# Patient Record
Sex: Female | Born: 1960
Health system: Southern US, Community
[De-identification: ages and names within clinical notes are randomized; demographics above are authoritative.]

## PROBLEM LIST (undated history)

## (undated) DIAGNOSIS — T7840XA Allergy, unspecified, initial encounter: Secondary | ICD-10-CM

## (undated) DIAGNOSIS — D649 Anemia, unspecified: Secondary | ICD-10-CM

## (undated) DIAGNOSIS — J45909 Unspecified asthma, uncomplicated: Secondary | ICD-10-CM

## (undated) DIAGNOSIS — M8580 Other specified disorders of bone density and structure, unspecified site: Secondary | ICD-10-CM

## (undated) DIAGNOSIS — R609 Edema, unspecified: Secondary | ICD-10-CM

## (undated) DIAGNOSIS — E538 Deficiency of other specified B group vitamins: Secondary | ICD-10-CM

## (undated) DIAGNOSIS — M2550 Pain in unspecified joint: Secondary | ICD-10-CM

## (undated) DIAGNOSIS — M1990 Unspecified osteoarthritis, unspecified site: Secondary | ICD-10-CM

## (undated) DIAGNOSIS — E559 Vitamin D deficiency, unspecified: Secondary | ICD-10-CM

## (undated) DIAGNOSIS — K219 Gastro-esophageal reflux disease without esophagitis: Secondary | ICD-10-CM

## (undated) DIAGNOSIS — G709 Myoneural disorder, unspecified: Secondary | ICD-10-CM

## (undated) DIAGNOSIS — G894 Chronic pain syndrome: Secondary | ICD-10-CM

## (undated) HISTORY — DX: Anemia, unspecified: D64.9

## (undated) HISTORY — DX: Unspecified osteoarthritis, unspecified site: M19.90

## (undated) HISTORY — DX: Vitamin D deficiency, unspecified: E55.9

## (undated) HISTORY — PX: ORTHOPEDIC SURGERY: SHX850

## (undated) HISTORY — DX: Deficiency of other specified B group vitamins: E53.8

## (undated) HISTORY — DX: Edema, unspecified: R60.9

## (undated) HISTORY — PX: SPINE SURGERY: SHX786

## (undated) HISTORY — PX: BACK SURGERY: SHX140

## (undated) HISTORY — PX: HAND SURGERY: SHX662

## (undated) HISTORY — DX: Pain in unspecified joint: M25.50

## (undated) HISTORY — PX: CHOLECYSTECTOMY: SHX55

## (undated) HISTORY — DX: Other specified disorders of bone density and structure, unspecified site: M85.80

## (undated) HISTORY — PX: HERNIA REPAIR: SHX51

## (undated) HISTORY — PX: OTHER SURGICAL HISTORY: SHX169

## (undated) HISTORY — PX: APPENDECTOMY: SHX54

## (undated) HISTORY — DX: Allergy, unspecified, initial encounter: T78.40XA

## (undated) HISTORY — PX: EYE SURGERY: SHX253

## (undated) HISTORY — DX: Gastro-esophageal reflux disease without esophagitis: K21.9

## (undated) HISTORY — PX: FRACTURE SURGERY: SHX138

## (undated) HISTORY — DX: Myoneural disorder, unspecified: G70.9

## (undated) HISTORY — DX: Unspecified asthma, uncomplicated: J45.909

## (undated) HISTORY — DX: Chronic pain syndrome: G89.4

---

## 1999-11-07 ENCOUNTER — Encounter (INDEPENDENT_AMBULATORY_CARE_PROVIDER_SITE_OTHER): Payer: Self-pay | Admitting: Internal Medicine

## 2000-08-17 HISTORY — PX: ROTATOR CUFF REPAIR: SHX139

## 2003-08-20 ENCOUNTER — Other Ambulatory Visit: Admission: RE | Admit: 2003-08-20 | Discharge: 2003-08-20 | Payer: Self-pay | Admitting: Family Medicine

## 2004-04-08 ENCOUNTER — Ambulatory Visit (HOSPITAL_COMMUNITY): Admission: RE | Admit: 2004-04-08 | Discharge: 2004-04-08 | Payer: Self-pay | Admitting: Family Medicine

## 2004-04-26 ENCOUNTER — Encounter (INDEPENDENT_AMBULATORY_CARE_PROVIDER_SITE_OTHER): Payer: Self-pay | Admitting: *Deleted

## 2004-04-26 ENCOUNTER — Ambulatory Visit (HOSPITAL_COMMUNITY): Admission: RE | Admit: 2004-04-26 | Discharge: 2004-04-26 | Payer: Self-pay | Admitting: Internal Medicine

## 2004-04-26 ENCOUNTER — Ambulatory Visit: Payer: Self-pay | Admitting: Internal Medicine

## 2004-05-31 ENCOUNTER — Ambulatory Visit: Payer: Self-pay | Admitting: Internal Medicine

## 2004-07-20 ENCOUNTER — Ambulatory Visit: Payer: Self-pay | Admitting: Family Medicine

## 2005-02-08 ENCOUNTER — Ambulatory Visit: Payer: Self-pay | Admitting: Family Medicine

## 2005-12-29 ENCOUNTER — Ambulatory Visit: Payer: Self-pay | Admitting: Family Medicine

## 2006-01-15 ENCOUNTER — Other Ambulatory Visit: Admission: RE | Admit: 2006-01-15 | Discharge: 2006-01-15 | Payer: Self-pay | Admitting: Family Medicine

## 2006-01-15 ENCOUNTER — Ambulatory Visit: Payer: Self-pay | Admitting: Family Medicine

## 2006-01-15 ENCOUNTER — Encounter (INDEPENDENT_AMBULATORY_CARE_PROVIDER_SITE_OTHER): Payer: Self-pay | Admitting: Internal Medicine

## 2006-01-15 LAB — CONVERTED CEMR LAB: Pap Smear: NORMAL

## 2006-01-17 HISTORY — PX: CARPAL TUNNEL RELEASE: SHX101

## 2006-05-16 ENCOUNTER — Encounter: Admission: RE | Admit: 2006-05-16 | Discharge: 2006-05-16 | Payer: Self-pay | Admitting: Family Medicine

## 2006-05-25 ENCOUNTER — Encounter: Admission: RE | Admit: 2006-05-25 | Discharge: 2006-05-25 | Payer: Self-pay | Admitting: Family Medicine

## 2006-09-10 ENCOUNTER — Ambulatory Visit: Payer: Self-pay | Admitting: Family Medicine

## 2007-04-01 ENCOUNTER — Encounter (INDEPENDENT_AMBULATORY_CARE_PROVIDER_SITE_OTHER): Payer: Self-pay | Admitting: Internal Medicine

## 2007-04-09 ENCOUNTER — Encounter (INDEPENDENT_AMBULATORY_CARE_PROVIDER_SITE_OTHER): Payer: Self-pay | Admitting: Internal Medicine

## 2007-04-09 DIAGNOSIS — Q690 Accessory finger(s): Secondary | ICD-10-CM | POA: Insufficient documentation

## 2007-04-09 DIAGNOSIS — G5600 Carpal tunnel syndrome, unspecified upper limb: Secondary | ICD-10-CM | POA: Insufficient documentation

## 2007-04-09 DIAGNOSIS — A630 Anogenital (venereal) warts: Secondary | ICD-10-CM | POA: Insufficient documentation

## 2007-04-09 DIAGNOSIS — K222 Esophageal obstruction: Secondary | ICD-10-CM

## 2007-04-09 DIAGNOSIS — G8929 Other chronic pain: Secondary | ICD-10-CM

## 2007-04-15 ENCOUNTER — Ambulatory Visit: Payer: Self-pay | Admitting: Family Medicine

## 2007-04-15 DIAGNOSIS — M479 Spondylosis, unspecified: Secondary | ICD-10-CM | POA: Insufficient documentation

## 2007-04-15 DIAGNOSIS — R609 Edema, unspecified: Secondary | ICD-10-CM | POA: Insufficient documentation

## 2007-04-15 DIAGNOSIS — G9050 Complex regional pain syndrome I, unspecified: Secondary | ICD-10-CM | POA: Insufficient documentation

## 2007-04-15 DIAGNOSIS — E669 Obesity, unspecified: Secondary | ICD-10-CM

## 2007-05-07 ENCOUNTER — Ambulatory Visit: Payer: Self-pay | Admitting: Family Medicine

## 2007-05-07 ENCOUNTER — Encounter (INDEPENDENT_AMBULATORY_CARE_PROVIDER_SITE_OTHER): Payer: Self-pay | Admitting: Internal Medicine

## 2007-05-10 LAB — CONVERTED CEMR LAB
ALT: 10 units/L (ref 0–35)
AST: 9 units/L (ref 0–37)
Albumin: 4.3 g/dL (ref 3.5–5.2)
Alkaline Phosphatase: 54 units/L (ref 39–117)
BUN: 11 mg/dL (ref 6–23)
Basophils Absolute: 0 10*3/uL (ref 0.0–0.1)
Basophils Relative: 1 % (ref 0–1)
CO2: 22 meq/L (ref 19–32)
Calcium: 9.5 mg/dL (ref 8.4–10.5)
Chloride: 103 meq/L (ref 96–112)
Cholesterol: 175 mg/dL (ref 0–200)
Creatinine, Ser: 0.66 mg/dL (ref 0.40–1.20)
Eosinophils Absolute: 0.2 10*3/uL (ref 0.2–0.7)
Eosinophils Relative: 4 % (ref 0–5)
Glucose, Bld: 78 mg/dL (ref 70–99)
HCT: 36.2 % (ref 36.0–46.0)
HDL: 58 mg/dL (ref >39)
Hemoglobin: 11.4 g/dL — ABNORMAL LOW (ref 12.0–15.0)
LDL Cholesterol: 102 mg/dL — ABNORMAL HIGH (ref 0–99)
Lymphocytes Relative: 22 % (ref 12–46)
Lymphs Abs: 1 10*3/uL (ref 0.7–4.0)
MCHC: 31.5 g/dL (ref 30.0–36.0)
MCV: 89.4 fL (ref 78.0–100.0)
Monocytes Absolute: 0.4 10*3/uL (ref 0.1–1.0)
Monocytes Relative: 8 % (ref 3–12)
Neutro Abs: 2.8 10*3/uL (ref 1.7–7.7)
Neutrophils Relative %: 65 % (ref 43–77)
Platelets: 348 10*3/uL (ref 150–400)
Potassium: 4.1 meq/L (ref 3.5–5.3)
RBC: 4.05 M/uL (ref 3.87–5.11)
RDW: 14.7 % (ref 11.5–15.5)
Sodium: 140 meq/L (ref 135–145)
TSH: 1.946 microintl units/mL (ref 0.350–5.50)
Total Bilirubin: 0.5 mg/dL (ref 0.3–1.2)
Total CHOL/HDL Ratio: 3
Total Protein: 7.2 g/dL (ref 6.0–8.3)
Triglycerides: 73 mg/dL (ref <150)
VLDL: 15 mg/dL (ref 0–40)
Vit D, 1,25-Dihydroxy: 22 — ABNORMAL LOW (ref 30–89)
WBC: 4.4 10*3/uL (ref 4.0–10.5)

## 2007-05-27 ENCOUNTER — Encounter (INDEPENDENT_AMBULATORY_CARE_PROVIDER_SITE_OTHER): Payer: Self-pay | Admitting: Internal Medicine

## 2007-06-06 ENCOUNTER — Encounter (INDEPENDENT_AMBULATORY_CARE_PROVIDER_SITE_OTHER): Payer: Self-pay | Admitting: Internal Medicine

## 2007-10-23 ENCOUNTER — Encounter (INDEPENDENT_AMBULATORY_CARE_PROVIDER_SITE_OTHER): Payer: Self-pay | Admitting: Internal Medicine

## 2007-12-25 ENCOUNTER — Encounter (INDEPENDENT_AMBULATORY_CARE_PROVIDER_SITE_OTHER): Payer: Self-pay | Admitting: Internal Medicine

## 2007-12-25 ENCOUNTER — Ambulatory Visit: Payer: Self-pay | Admitting: Family Medicine

## 2007-12-25 DIAGNOSIS — D649 Anemia, unspecified: Secondary | ICD-10-CM

## 2007-12-25 DIAGNOSIS — E559 Vitamin D deficiency, unspecified: Secondary | ICD-10-CM

## 2007-12-27 LAB — CONVERTED CEMR LAB
HCT: 40.2 % (ref 36.0–46.0)
Hemoglobin: 12.8 g/dL (ref 12.0–15.0)
MCHC: 31.8 g/dL (ref 30.0–36.0)
MCV: 91.8 fL (ref 78.0–100.0)
Platelets: 369 10*3/uL (ref 150–400)
RBC: 4.38 M/uL (ref 3.87–5.11)
RDW: 14.6 % (ref 11.5–15.5)
Vit D, 1,25-Dihydroxy: 14 — ABNORMAL LOW (ref 30–89)
WBC: 6.2 10*3/uL (ref 4.0–10.5)

## 2008-01-13 ENCOUNTER — Telehealth (INDEPENDENT_AMBULATORY_CARE_PROVIDER_SITE_OTHER): Payer: Self-pay | Admitting: Internal Medicine

## 2008-01-17 ENCOUNTER — Telehealth (INDEPENDENT_AMBULATORY_CARE_PROVIDER_SITE_OTHER): Payer: Self-pay | Admitting: Internal Medicine

## 2008-01-22 ENCOUNTER — Encounter (INDEPENDENT_AMBULATORY_CARE_PROVIDER_SITE_OTHER): Payer: Self-pay | Admitting: Internal Medicine

## 2008-01-28 ENCOUNTER — Encounter (INDEPENDENT_AMBULATORY_CARE_PROVIDER_SITE_OTHER): Payer: Self-pay | Admitting: Internal Medicine

## 2008-02-13 ENCOUNTER — Telehealth: Payer: Self-pay | Admitting: Family Medicine

## 2008-02-27 ENCOUNTER — Telehealth (INDEPENDENT_AMBULATORY_CARE_PROVIDER_SITE_OTHER): Payer: Self-pay | Admitting: Internal Medicine

## 2008-02-27 ENCOUNTER — Encounter (INDEPENDENT_AMBULATORY_CARE_PROVIDER_SITE_OTHER): Payer: Self-pay | Admitting: Internal Medicine

## 2008-03-19 ENCOUNTER — Ambulatory Visit: Payer: Self-pay | Admitting: Family Medicine

## 2008-03-19 ENCOUNTER — Encounter (INDEPENDENT_AMBULATORY_CARE_PROVIDER_SITE_OTHER): Payer: Self-pay | Admitting: Internal Medicine

## 2008-03-24 ENCOUNTER — Telehealth (INDEPENDENT_AMBULATORY_CARE_PROVIDER_SITE_OTHER): Payer: Self-pay | Admitting: Internal Medicine

## 2008-03-26 ENCOUNTER — Telehealth (INDEPENDENT_AMBULATORY_CARE_PROVIDER_SITE_OTHER): Payer: Self-pay | Admitting: Internal Medicine

## 2008-03-26 LAB — CONVERTED CEMR LAB: Vit D, 1,25-Dihydroxy: 93 — ABNORMAL HIGH (ref 30–89)

## 2008-04-22 ENCOUNTER — Encounter (INDEPENDENT_AMBULATORY_CARE_PROVIDER_SITE_OTHER): Payer: Self-pay | Admitting: Internal Medicine

## 2008-05-06 ENCOUNTER — Encounter (INDEPENDENT_AMBULATORY_CARE_PROVIDER_SITE_OTHER): Payer: Self-pay | Admitting: Internal Medicine

## 2008-05-06 ENCOUNTER — Other Ambulatory Visit: Admission: RE | Admit: 2008-05-06 | Discharge: 2008-05-06 | Payer: Self-pay | Admitting: Family Medicine

## 2008-05-06 ENCOUNTER — Ambulatory Visit: Payer: Self-pay | Admitting: Family Medicine

## 2008-05-06 LAB — CONVERTED CEMR LAB: Pap Smear: NORMAL

## 2008-05-12 ENCOUNTER — Encounter: Admission: RE | Admit: 2008-05-12 | Discharge: 2008-05-12 | Payer: Self-pay | Admitting: Family Medicine

## 2008-05-18 ENCOUNTER — Encounter (INDEPENDENT_AMBULATORY_CARE_PROVIDER_SITE_OTHER): Payer: Self-pay | Admitting: *Deleted

## 2008-05-19 ENCOUNTER — Encounter (INDEPENDENT_AMBULATORY_CARE_PROVIDER_SITE_OTHER): Payer: Self-pay | Admitting: Internal Medicine

## 2008-05-19 DIAGNOSIS — R928 Other abnormal and inconclusive findings on diagnostic imaging of breast: Secondary | ICD-10-CM

## 2008-05-22 ENCOUNTER — Encounter: Admission: RE | Admit: 2008-05-22 | Discharge: 2008-05-22 | Payer: Self-pay | Admitting: Family Medicine

## 2008-05-26 ENCOUNTER — Encounter (INDEPENDENT_AMBULATORY_CARE_PROVIDER_SITE_OTHER): Payer: Self-pay | Admitting: *Deleted

## 2008-05-26 ENCOUNTER — Telehealth (INDEPENDENT_AMBULATORY_CARE_PROVIDER_SITE_OTHER): Payer: Self-pay | Admitting: Internal Medicine

## 2008-06-16 ENCOUNTER — Ambulatory Visit: Payer: Self-pay | Admitting: Family Medicine

## 2008-07-06 ENCOUNTER — Telehealth (INDEPENDENT_AMBULATORY_CARE_PROVIDER_SITE_OTHER): Payer: Self-pay | Admitting: Internal Medicine

## 2008-09-02 ENCOUNTER — Encounter (INDEPENDENT_AMBULATORY_CARE_PROVIDER_SITE_OTHER): Payer: Self-pay | Admitting: Internal Medicine

## 2008-09-22 ENCOUNTER — Ambulatory Visit: Payer: Self-pay | Admitting: Internal Medicine

## 2008-09-24 ENCOUNTER — Encounter (INDEPENDENT_AMBULATORY_CARE_PROVIDER_SITE_OTHER): Payer: Self-pay | Admitting: Internal Medicine

## 2008-09-25 LAB — CONVERTED CEMR LAB: Vit D, 25-Hydroxy: 19 ng/mL — ABNORMAL LOW (ref 30–89)

## 2008-10-08 ENCOUNTER — Ambulatory Visit: Payer: Self-pay | Admitting: Family Medicine

## 2008-10-14 ENCOUNTER — Encounter (INDEPENDENT_AMBULATORY_CARE_PROVIDER_SITE_OTHER): Payer: Self-pay | Admitting: Internal Medicine

## 2008-11-02 ENCOUNTER — Telehealth (INDEPENDENT_AMBULATORY_CARE_PROVIDER_SITE_OTHER): Payer: Self-pay | Admitting: Internal Medicine

## 2008-11-18 ENCOUNTER — Encounter (INDEPENDENT_AMBULATORY_CARE_PROVIDER_SITE_OTHER): Payer: Self-pay | Admitting: Internal Medicine

## 2008-12-23 ENCOUNTER — Encounter (INDEPENDENT_AMBULATORY_CARE_PROVIDER_SITE_OTHER): Payer: Self-pay | Admitting: Internal Medicine

## 2009-01-06 ENCOUNTER — Telehealth (INDEPENDENT_AMBULATORY_CARE_PROVIDER_SITE_OTHER): Payer: Self-pay | Admitting: Internal Medicine

## 2009-02-05 ENCOUNTER — Telehealth (INDEPENDENT_AMBULATORY_CARE_PROVIDER_SITE_OTHER): Payer: Self-pay | Admitting: Internal Medicine

## 2009-02-24 ENCOUNTER — Encounter (INDEPENDENT_AMBULATORY_CARE_PROVIDER_SITE_OTHER): Payer: Self-pay | Admitting: Internal Medicine

## 2009-03-11 ENCOUNTER — Encounter (INDEPENDENT_AMBULATORY_CARE_PROVIDER_SITE_OTHER): Payer: Self-pay | Admitting: Internal Medicine

## 2009-05-20 ENCOUNTER — Encounter: Admission: RE | Admit: 2009-05-20 | Discharge: 2009-05-20 | Payer: Self-pay | Admitting: Family Medicine

## 2009-05-24 ENCOUNTER — Encounter (INDEPENDENT_AMBULATORY_CARE_PROVIDER_SITE_OTHER): Payer: Self-pay | Admitting: *Deleted

## 2009-06-01 ENCOUNTER — Telehealth (INDEPENDENT_AMBULATORY_CARE_PROVIDER_SITE_OTHER): Payer: Self-pay | Admitting: Internal Medicine

## 2009-07-08 ENCOUNTER — Encounter (INDEPENDENT_AMBULATORY_CARE_PROVIDER_SITE_OTHER): Payer: Self-pay | Admitting: Internal Medicine

## 2009-07-30 ENCOUNTER — Encounter: Payer: Self-pay | Admitting: Family Medicine

## 2009-08-18 ENCOUNTER — Encounter: Payer: Self-pay | Admitting: Family Medicine

## 2009-09-14 ENCOUNTER — Ambulatory Visit: Payer: Self-pay | Admitting: Family Medicine

## 2009-09-15 LAB — CONVERTED CEMR LAB: Vit D, 25-Hydroxy: 19 ng/mL — ABNORMAL LOW (ref 30–89)

## 2009-09-16 LAB — CONVERTED CEMR LAB
ALT: 12 units/L (ref 0–35)
AST: 11 units/L (ref 0–37)
Albumin: 3.8 g/dL (ref 3.5–5.2)
Alkaline Phosphatase: 47 units/L (ref 39–117)
BUN: 8 mg/dL (ref 6–23)
Bilirubin, Direct: 0.1 mg/dL (ref 0.0–0.3)
CO2: 30 meq/L (ref 19–32)
Calcium: 9.1 mg/dL (ref 8.4–10.5)
Chloride: 105 meq/L (ref 96–112)
Cholesterol: 154 mg/dL (ref 0–200)
Creatinine, Ser: 0.6 mg/dL (ref 0.4–1.2)
GFR calc non Af Amer: 112.45 mL/min (ref >60)
Glucose, Bld: 83 mg/dL (ref 70–99)
HDL: 51.5 mg/dL (ref >39.00)
LDL Cholesterol: 90 mg/dL (ref 0–99)
Potassium: 4.1 meq/L (ref 3.5–5.1)
Sodium: 142 meq/L (ref 135–145)
Total Bilirubin: 0.5 mg/dL (ref 0.3–1.2)
Total CHOL/HDL Ratio: 3
Total Protein: 6.8 g/dL (ref 6.0–8.3)
Triglycerides: 65 mg/dL (ref 0.0–149.0)
VLDL: 13 mg/dL (ref 0.0–40.0)

## 2009-09-20 ENCOUNTER — Encounter: Payer: Self-pay | Admitting: Family Medicine

## 2009-10-25 ENCOUNTER — Encounter: Payer: Self-pay | Admitting: Family Medicine

## 2010-01-19 ENCOUNTER — Encounter: Payer: Self-pay | Admitting: Family Medicine

## 2010-02-07 ENCOUNTER — Ambulatory Visit: Payer: Self-pay | Admitting: Internal Medicine

## 2010-02-07 DIAGNOSIS — J029 Acute pharyngitis, unspecified: Secondary | ICD-10-CM

## 2010-02-07 LAB — CONVERTED CEMR LAB: Rapid Strep: NEGATIVE

## 2010-03-01 ENCOUNTER — Telehealth: Payer: Self-pay | Admitting: Family Medicine

## 2010-03-10 ENCOUNTER — Encounter: Payer: Self-pay | Admitting: Family Medicine

## 2010-03-21 ENCOUNTER — Ambulatory Visit: Payer: Self-pay | Admitting: Family Medicine

## 2010-03-21 DIAGNOSIS — N289 Disorder of kidney and ureter, unspecified: Secondary | ICD-10-CM | POA: Insufficient documentation

## 2010-03-21 LAB — CONVERTED CEMR LAB
Bilirubin Urine: NEGATIVE
Glucose, Urine, Semiquant: NEGATIVE
Ketones, urine, test strip: NEGATIVE
Nitrite: NEGATIVE
Protein, U semiquant: NEGATIVE
Specific Gravity, Urine: 1.01
Urobilinogen, UA: 0.2
WBC Urine, dipstick: NEGATIVE
pH: 5

## 2010-03-22 ENCOUNTER — Encounter: Admission: RE | Admit: 2010-03-22 | Discharge: 2010-03-22 | Payer: Self-pay | Admitting: Family Medicine

## 2010-03-22 ENCOUNTER — Encounter: Payer: Self-pay | Admitting: Family Medicine

## 2010-03-24 ENCOUNTER — Ambulatory Visit: Payer: Self-pay | Admitting: Family Medicine

## 2010-03-24 LAB — CONVERTED CEMR LAB
Bilirubin Urine: NEGATIVE
Blood in Urine, dipstick: NEGATIVE
Glucose, Urine, Semiquant: NEGATIVE
Ketones, urine, test strip: NEGATIVE
Nitrite: NEGATIVE
Protein, U semiquant: NEGATIVE
Specific Gravity, Urine: 1.015
Urobilinogen, UA: 0.2
WBC Urine, dipstick: NEGATIVE
pH: 5

## 2010-04-11 ENCOUNTER — Telehealth: Payer: Self-pay | Admitting: Family Medicine

## 2010-04-29 ENCOUNTER — Telehealth: Payer: Self-pay | Admitting: Family Medicine

## 2010-06-14 ENCOUNTER — Encounter
Admission: RE | Admit: 2010-06-14 | Discharge: 2010-06-14 | Payer: Self-pay | Source: Home / Self Care | Attending: Family Medicine | Admitting: Family Medicine

## 2010-06-30 ENCOUNTER — Telehealth: Payer: Self-pay | Admitting: Family Medicine

## 2010-07-10 ENCOUNTER — Encounter: Payer: Self-pay | Admitting: Family Medicine

## 2010-07-11 ENCOUNTER — Encounter: Payer: Self-pay | Admitting: Family Medicine

## 2010-07-13 ENCOUNTER — Telehealth: Payer: Self-pay | Admitting: Family Medicine

## 2010-07-19 NOTE — Letter (Signed)
Summary: [facility]  [facility]   Imported By: Lanelle Bal 08/24/2009 09:42:08  _____________________________________________________________________  External Attachment:    Type:   Image     Comment:   External Document  Appended Document: [facility] Pt needs to establish with new doctor.  Appended Document: [facility] Patient notified as instructed by telephone. Was informed by patient that she will call back later today and get something scheduled.

## 2010-07-19 NOTE — Assessment & Plan Note (Signed)
Summary: DISCUSS MRI RESULTS BY DR POEHLING/   Vital Signs:  Patient profile:   50 year old female Height:      65 inches Weight:      146 pounds BMI:     24.38 Temp:     97.5 degrees F oral Pulse rate:   60 / minute Pulse rhythm:   regular BP sitting:   120 / 72  (left arm) Cuff size:   regular  Vitals Entered By: Linde Gillis CMA Duncan Dull) (March 21, 2010 11:36 AM) CC: discuss MRI results   History of Present Illness: 50 yo here to follow up MRI that she had done for her hip pain.  Dr. Randall An, ortho, ordered MRI of left hip- incidently T2 hyperintense lesions on right kidney were found that measured up to 11-12 mm. Pt has been asymptomatic, no worsening back pain, no visible hematuria, fevers or abdominal pain.  Several months ago, felt her urine had a strong odor otherwise nothing out of the ordinary.    Current Medications (verified): 1)  Vicodin 5-500 Mg  Tabs (Hydrocodone-Acetaminophen) .... Take One By Mouth Four Times A Day 2)  Prilosec 40 Mg  Cpdr (Omeprazole) .... Take One By Mouth Once A Day As Needed 3)  Lidoderm 5 %  Ptch (Lidocaine) .Marland Kitchen.. 1-2  Patches On Effected Area, Leave On For No Longer Than 12 Hours 4)  Flexeril 5 Mg  Tabs (Cyclobenzaprine Hcl) .... Take 1/2 - 1 By Mouth Four Times A Day As Needed 5)  Multivitamins   Tabs (Multiple Vitamin) .... Take One By Mouth Once A Day 6)  Albuterol 90 Mcg/act  Aers (Albuterol) .... Use As Needed 7)  Hydrochlorothiazide 25 Mg  Tabs (Hydrochlorothiazide) .... 1/2 To 1 Once Daily For Swelling 8)  Tramadol Hcl 50 Mg  Tabs (Tramadol Hcl) .Marland Kitchen.. 1-2 Every 6 Hrs As Needed Pain 9)  Tizanidine Hcl 2 Mg Tabs (Tizanidine Hcl) .Marland Kitchen.. 1 Two Times A Day As Needed Muscle Spasms 10)  Lidoderm Oint .... Apply Thinnly To Tender Areas On L Shoulder 2-3 Times Daily, 30gm Tube 11)  Aleve 220 Mg Tabs (Naproxen Sodium) .... Two Two Times A Day 12)  Norco 5-325 Mg Tabs (Hydrocodone-Acetaminophen) .Marland Kitchen.. 1 Tab Every Six Hours As Needed For  Pain  Allergies: 1)  ! Clonidine Hcl (Clonidine Hcl) 2)  ! Septra Ds (Sulfamethoxazole-Trimethoprim) 3)  ! * Dilaudid 4)  * Codeine 5)  * Morphine 6)  * Percocet 7)  * Prednisone 8)  * Sulfa  Past History:  Past Medical History: Last updated: 09/14/2009 GERD Complex regional pain sydrome  Past Surgical History: Last updated: 02/07/2010 s/p 57 surgeries  Carpal tunnel Rotator cuff repair-- 03/02 Carpal tunnel-- 08/07 Pollicized left thumb-- 04/08 EMG left arm-- 08/07 L  hand surg --9/09 EMG/NCS Nml--3/11  Social History: Last updated: 04/09/2007 Marital Status: Divorced--remarried Children: 2 Occupation: Works in Psychologist, counselling Daughter  is hyperreflexic, son has polydactyl  Risk Factors: Alcohol Use: <1 (04/15/2007) Exercise: no (05/06/2008)  Risk Factors: Smoking Status: never (04/09/2007) Passive Smoke Exposure: yes (05/06/2008)  Review of Systems      See HPI General:  Denies malaise. Eyes:  Denies blurring. GI:  Denies abdominal pain and change in bowel habits. GU:  Denies dysuria, incontinence, and urinary frequency.  Physical Exam  General:  alert, well-developed, well-nourished, and well-hydrated.  NAS Abdomen:  soft, non-tender, normal bowel sounds, no distention, no masses, no guarding, no abdominal hernia, no inguinal hernia, no hepatomegaly, and no splenomegaly.  Psych:  normally interactive and good eye contact.     Impression & Recommendations:  Problem # 1:  UNSPECIFIED DISORDER OF KIDNEY AND URETER (ICD-593.9) Assessment New UA showed some microscopic hematuria, will send for culture. Will get renal ultrasound to evaluate lesions. Orders: Radiology Referral (Radiology) UA Dipstick w/o Micro (manual) (41324) T-Culture, Urine ([id number])  Complete Medication List: 1)  Vicodin 5-500 Mg Tabs (Hydrocodone-acetaminophen) .... Take one by mouth four times a day 2)  Prilosec 40 Mg Cpdr (Omeprazole) .... Take one by mouth once a day  as needed 3)  Lidoderm 5 % Ptch (Lidocaine) .Marland Kitchen.. 1-2  patches on effected area, leave on for no longer than 12 hours 4)  Flexeril 5 Mg Tabs (Cyclobenzaprine hcl) .... Take 1/2 - 1 by mouth four times a day as needed 5)  Multivitamins Tabs (Multiple vitamin) .... Take one by mouth once a day 6)  Albuterol 90 Mcg/act Aers (Albuterol) .... Use as needed 7)  Hydrochlorothiazide 25 Mg Tabs (Hydrochlorothiazide) .... 1/2 to 1 once daily for swelling 8)  Tramadol Hcl 50 Mg Tabs (Tramadol hcl) .Marland Kitchen.. 1-2 every 6 hrs as needed pain 9)  Tizanidine Hcl 2 Mg Tabs (Tizanidine hcl) .Marland Kitchen.. 1 two times a day as needed muscle spasms 10)  Lidoderm Oint  .... Apply thinnly to tender areas on l shoulder 2-3 times daily, 30gm tube 11)  Aleve 220 Mg Tabs (Naproxen sodium) .... Two two times a day 12)  Norco 5-325 Mg Tabs (Hydrocodone-acetaminophen) .Marland Kitchen.. 1 tab every six hours as needed for pain  Patient Instructions: 1)  Please stop by to see Shirlee Limerick on your way out. Prescriptions: NORCO 5-325 MG TABS (HYDROCODONE-ACETAMINOPHEN) 1 tab every six hours as needed for pain  #60 x 0   Entered and Authorized by:   Ruthe Mannan MD   Signed by:   Ruthe Mannan MD on 03/21/2010   Method used:   Print then Give to Patient   RxID:   [id number]   Current Allergies (reviewed today): ! CLONIDINE HCL (CLONIDINE HCL) ! SEPTRA DS (SULFAMETHOXAZOLE-TRIMETHOPRIM) ! * DILAUDID * CODEINE * MORPHINE * PERCOCET * PREDNISONE * SULFA  Laboratory Results   Urine Tests  Date/Time Received: March 21, 2010 11:53 AM   Routine Urinalysis   Color: yellow Appearance: Clear Glucose: negative   (Normal Range: Negative) Bilirubin: negative   (Normal Range: Negative) Ketone: negative   (Normal Range: Negative) Spec. Gravity: 1.010   (Normal Range: 1.003-1.035) Blood: trace-intact   (Normal Range: Negative) pH: 5.0   (Normal Range: 5.0-8.0) Protein: negative   (Normal Range: Negative) Urobilinogen: 0.2   (Normal Range:  0-1) Nitrite: negative   (Normal Range: Negative) Leukocyte Esterace: negative   (Normal Range: Negative)

## 2010-07-19 NOTE — Progress Notes (Signed)
Summary: vicodin  Phone Note Refill Request Message from:  Fax from Pharmacy on April 11, 2010 9:13 AM  Refills Requested: Medication #1:  VICODIN 5-500 MG  TABS Take one by mouth four times a day   Last Refilled: 03/24/2010 Refill request from [place] [facility]. [phone]  Initial call taken by: Melody Comas,  April 11, 2010 9:17 AM  Follow-up for Phone Call        Rx called to pharmacy Follow-up by: Linde Gillis CMA Duncan Dull),  April 11, 2010 9:34 AM    Prescriptions: VICODIN 5-500 MG  TABS (HYDROCODONE-ACETAMINOPHEN) Take one by mouth four times a day  #200 x 2   Entered and Authorized by:   Ruthe Mannan MD   Signed by:   Ruthe Mannan MD on 04/11/2010   Method used:   Telephoned to ...       [facility]* (retail)       [address]       [place], [place]  [address]       Ph: [phone]       Fax: [phone]   RxID:   [id number]

## 2010-07-19 NOTE — Assessment & Plan Note (Signed)
Summary: CONGESTION & SORE THROAT / LFW   Vital Signs:  Patient profile:   50 year old female Weight:      149.25 pounds Pulse rate:   70 / minute Pulse rhythm:   regular BP sitting:   110 / 72  (left arm) Cuff size:   regular  Vitals Entered By: Selena Batten Dance CMA Duncan Dull) (February 07, 2010 3:32 PM) CC: ST and congestion x3 days   History of Present Illness: CC: ST, congestion  3d h/o ST and congestion.  + PNDrip.  Did take care of new cats 2x/day over weekend but normally not allergic like this.  + subjective fever yesterday morning.  + HA, ear pain, + sinus tenderness, + purulent drainage x yesterday.  No coughing, abd pain, n/v/d.  No drooling, no trouble opening mouth.  Has taken pseudophed, chlorpheniramine, nyquil at night.  Possible sick contacts at home.  Takes vicodin for pain, unsure if cough suppressed.  Allergies: 1)  ! Clonidine Hcl (Clonidine Hcl) 2)  ! Septra Ds (Sulfamethoxazole-Trimethoprim) 3)  ! * Dilaudid 4)  * Codeine 5)  * Morphine 6)  * Percocet 7)  * Prednisone 8)  * Sulfa  Past History:  Past Surgical History: s/p 57 surgeries  Carpal tunnel Rotator cuff repair-- 03/02 Carpal tunnel-- 08/07 Pollicized left thumb-- 04/08 EMG left arm-- 08/07 L  hand surg --9/09 EMG/NCS Nml--3/11  Review of Systems       per HPI  Physical Exam  General:  alert, well-developed, well-nourished, and well-hydrated.  NAS Head:  Normocephalic and atraumatic without obvious abnormalities. No apparent alopecia or balding.  + maxillary tenderness Eyes:  L lazy eye.  R PERRLA Ears:  fluid behind L TM R TM clear Nose:  no external deformity.   Mouth:  MMM, pharynx red, no exudates appreciated. Neck:  R AC LAD Lungs:  no crackles, wheezing,  + coarse breath sounds throughout Heart:  normal rate, regular rhythm, and no murmur.   Msk:  bilateral UE deformity, chronic Pulses:  2+ periph pulses Extremities:  + chronic edema BLE Neurologic:  alert & oriented X3 and  sensation intact to light touch.  able to get on and off examtable unaided Skin:  Intact without suspicious lesions or rashes   Impression & Recommendations:  Problem # 1:  PHARYNGITIS (ICD-462) acute rhinosinusitis with pharyngitis.  rapid strep negative.  given only 3 days, likely viral.  treat symptomatically with NSAIDs, salt water gargles, throat lozenges, neti pot or saline nasal drops, plenty of fluid and rest.  RTC if not improving.  Orders: Rapid Strep (10272)  Her updated medication list for this problem includes:    Aleve 220 Mg Tabs (Naproxen sodium) .Marland Kitchen..Marland Kitchen Two two times a day  Complete Medication List: 1)  Vicodin 5-500 Mg Tabs (Hydrocodone-acetaminophen) .... Take one by mouth four times a day 2)  Prilosec 40 Mg Cpdr (Omeprazole) .... Take one by mouth once a day as needed 3)  Lidoderm 5 % Ptch (Lidocaine) .Marland Kitchen.. 1-2  patches on effected area, leave on for no longer than 12 hours 4)  Flexeril 5 Mg Tabs (Cyclobenzaprine hcl) .... Take 1/2 - 1 by mouth four times a day as needed 5)  Multivitamins Tabs (Multiple vitamin) .... Take one by mouth once a day 6)  Albuterol 90 Mcg/act Aers (Albuterol) .... Use as needed 7)  Hydrochlorothiazide 25 Mg Tabs (Hydrochlorothiazide) .... 1/2 to 1 once daily for swelling 8)  Tramadol Hcl 50 Mg Tabs (Tramadol hcl) .Marland Kitchen.. 1-2 every  6 hrs as needed pain 9)  Tizanidine Hcl 2 Mg Tabs (Tizanidine hcl) .Marland Kitchen.. 1 two times a day as needed muscle spasms 10)  Lidoderm Oint  .... Apply thinnly to tender areas on l shoulder 2-3 times daily 11)  Aleve 220 Mg Tabs (Naproxen sodium) .... Two two times a day  Patient Instructions: 1)  continue alleve to help with throat inflammation. 2)  Cold popsicles to help soothe throat.  You could try numbing throat lozenges as well. 3)  Plenty of rest and fluids over next couple of days. 4)  Viruses usually take 7-10 days before you start feeling better. I'd expect you to start feeling better by the end of this week.     5)  If trouble breathing, drooling, throat swelling, fever >101.5, or worsening instead of improving, please return to be seen. 6)  Good to meet you today.  Call clinic with questions.  Current Allergies (reviewed today): ! CLONIDINE HCL (CLONIDINE HCL) ! SEPTRA DS (SULFAMETHOXAZOLE-TRIMETHOPRIM) ! * DILAUDID * CODEINE * MORPHINE * PERCOCET * PREDNISONE * SULFA  Laboratory Results  Date/Time Received: February 07, 2010 4:01 PM  Date/Time Reported: February 07, 2010 4:01 PM   Other Tests  Rapid Strep: negative

## 2010-07-19 NOTE — Assessment & Plan Note (Signed)
   Nurse Visit   Allergies: 1)  ! Clonidine Hcl (Clonidine Hcl) 2)  ! Septra Ds (Sulfamethoxazole-Trimethoprim) 3)  ! * Dilaudid 4)  * Codeine 5)  * Morphine 6)  * Percocet 7)  * Prednisone 8)  * Sulfa Laboratory Results   Urine Tests  Date/Time Received: March 24, 2010 12:07 PM   Routine Urinalysis   Color: yellow Appearance: Clear Glucose: negative   (Normal Range: Negative) Bilirubin: negative   (Normal Range: Negative) Ketone: negative   (Normal Range: Negative) Spec. Gravity: 1.015   (Normal Range: 1.003-1.035) Blood: negative   (Normal Range: Negative) pH: 5.0   (Normal Range: 5.0-8.0) Protein: negative   (Normal Range: Negative) Urobilinogen: 0.2   (Normal Range: 0-1) Nitrite: negative   (Normal Range: Negative) Leukocyte Esterace: negative   (Normal Range: Negative)       Great, please tell pt UA was completely normal and negative for blood.  Appended Document:  Patient advised via message left on cell phone voicemail.

## 2010-07-19 NOTE — Letter (Signed)
Summary: [facility]  [facility]   Imported By: Lanelle Bal 07/15/2009 11:21:02  _____________________________________________________________________  External Attachment:    Type:   Image     Comment:   External Document

## 2010-07-19 NOTE — Letter (Signed)
Summary: [facility]  [facility]   Imported By: Maryln Gottron 09/27/2009 11:06:32  _____________________________________________________________________  External Attachment:    Type:   Image     Comment:   External Document

## 2010-07-19 NOTE — Assessment & Plan Note (Signed)
Summary: CPX/TRANSFER FROM [facility]   Vital Signs:  Patient profile:   50 year old female Height:      65 inches Weight:      153.50 pounds BMI:     25.64 Temp:     98 degrees F oral Pulse rate:   84 / minute Pulse rhythm:   regular BP sitting:   92 / 58  (left arm) Cuff size:   regular  Vitals Entered By: Delilah Shan CMA (AAMA) (September 14, 2009 8:16 AM) CC: CPX - Transfer from [facility]   History of Present Illness: 50 yo with complex medical history, new to me, here for CPX.  Pain syndrome- very complex.  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  Current pain regimen is working "ok."  Tries to stay active but in pain all the time. Take Vicodin 5-500 four times daily, Tramadol 100 mg every 6 hours, Flexeril and Tizanidine 2 mg two times a day as needed.  Also has a Lidoderm patch. A lot of her pain is neuropathic.  Could not tolerate Gapabentin.  Has never tried Lyrica.  LE edema- related to above issue.  Well controlled on HCTZ.  Well woman- last lipid panel normal in 2008.  UTD on mammogram,  pap. She is due for colonscopy, but would like to defer until next year.    Current Medications (verified): 1)  Vicodin 5-500 Mg  Tabs (Hydrocodone-Acetaminophen) .... Take One By Mouth Four Times A Day 2)  Prilosec 40 Mg  Cpdr (Omeprazole) .... Take One By Mouth Once A Day As Needed 3)  Lidoderm 5 %  Ptch (Lidocaine) .Marland Kitchen.. 1-2  Patches On Effected Area, Leave On For No Longer Than 12 Hours 4)  Flexeril 5 Mg  Tabs (Cyclobenzaprine Hcl) .... Take 1/2 - 1 By Mouth Four Times A Day As Needed 5)  Multivitamins   Tabs (Multiple Vitamin) .... Take One By Mouth Once A Day 6)  Albuterol 90 Mcg/act  Aers (Albuterol) .... Use As Needed 7)  Hydrochlorothiazide 25 Mg  Tabs (Hydrochlorothiazide) .... 1/2 To 1 Once Daily For Swelling 8)  Tramadol Hcl 50 Mg  Tabs (Tramadol Hcl) .Marland Kitchen.. 1-2 Every 6 Hrs As Needed Pain 9)  Tizanidine  Hcl 2 Mg Tabs (Tizanidine Hcl) .Marland Kitchen.. 1 Two Times A Day As Needed Muscle Spasms 10)  Lidoderm Oint .... Apply Thinnly To Tender Areas On L Shoulder 2-3 Times Daily 11)  Lyrica 50 Mg Caps (Pregabalin) .Marland Kitchen.. 1 Tab By Mouth Two Times A Day  Allergies: 1)  ! Clonidine Hcl (Clonidine Hcl) 2)  ! Septra Ds (Sulfamethoxazole-Trimethoprim) 3)  * Codeine 4)  * Morphine 5)  * Percocet 6)  * Prednisone 7)  * Sulfa  Past History:  Past Medical History: GERD Complex regional pain sydrome  Social History: Reviewed history from 04/09/2007 and no changes required. 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 Eyes:  Denies blurring. ENT:  Denies difficulty swallowing. CV:  Denies chest pain or discomfort. Resp:  Denies shortness of breath. GI:  Denies abdominal pain, bloody stools, and change in bowel habits. MS:  Complains of joint pain and joint swelling; denies joint redness. Derm:  Denies rash. Psych:  Denies anxiety and depression. Endo:  Denies cold intolerance and heat intolerance.  Physical Exam  General:  alert, well-developed, well-nourished, and well-hydrated.  NAS Eyes:  pupils equal, pupils round, and no injection.   Ears:  External ear exam shows no significant lesions or deformities.  Otoscopic examination reveals clear canals, tympanic membranes are intact bilaterally without bulging, retraction, inflammation or discharge. Hearing is grossly normal bilaterally. Nose:  no external deformity.   Mouth:  MMM Neck:  no thyromegaly, no JVD, and no carotid bruits.   Lungs:  Normal respiratory effort, chest expands symmetrically. Lungs are clear to auscultation, no crackles or wheezes. Heart:  normal rate, regular rhythm, and no murmur.   Abdomen:  soft, non-tender, normal bowel sounds, no distention, no  masses, no guarding, no abdominal hernia, no inguinal hernia, no hepatomegaly, and no splenomegaly.   Extremities:  no edema eeither lower leg large varicose vein R thigh, not tender Neurologic:  alert & oriented X3 and sensation intact to light touch.  able to get on and off examtable unaided Psych:  normally interactive and good eye contact.     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 Given that she has failed Gabepentin and has significant neuropathic pain, will try Lyrica 50 mg two times a day. Follow up in one month.  Problem # 2:  Preventive Health Care (ICD-V70.0) Reviewed preventive care protocols, scheduled due services, and updated immunizations Discussed nutrition, exercise, diet, and healthy lifestyle.  FLP, BMET, Vit D today.  Complete Medication List: 1)  Vicodin 5-500 Mg Tabs (Hydrocodone-acetaminophen) .... Take one by mouth four times a day 2)  Prilosec 40 Mg Cpdr (Omeprazole) .... Take one by mouth once a day as needed 3)  Lidoderm 5 % Ptch (Lidocaine) .Marland Kitchen.. 1-2  patches on effected area, leave on for no longer than 12 hours 4)  Flexeril 5 Mg Tabs (Cyclobenzaprine hcl) .... Take 1/2 - 1 by mouth four times a day as needed 5)  Multivitamins Tabs (Multiple vitamin) .... Take one by mouth once a day 6)  Albuterol 90 Mcg/act Aers (Albuterol) .... Use as needed 7)  Hydrochlorothiazide 25 Mg Tabs (Hydrochlorothiazide) .... 1/2 to 1 once daily for swelling 8)  Tramadol Hcl 50 Mg Tabs (Tramadol hcl) .Marland Kitchen.. 1-2 every 6 hrs as needed pain 9)  Tizanidine Hcl 2 Mg Tabs (Tizanidine hcl) .Marland Kitchen.. 1 two times a day as needed muscle spasms 10)  Lidoderm Oint  .... Apply thinnly to tender areas on l shoulder 2-3 times daily 11)  Lyrica 50 Mg Caps (Pregabalin) .Marland Kitchen.. 1 tab by mouth two times a day  Other Orders: Venipuncture (91478) TLB-Lipid Panel (80061-LIPID) TLB-BMP (Basic  Metabolic Panel-BMET) (80048-METABOL) TLB-Hepatic/Liver Function Pnl (80076-HEPATIC) T-Vitamin D (25-Hydroxy) (29562-13086) Specimen Handling (57846) Prescriptions: LYRICA 50 MG CAPS (PREGABALIN) 1 tab by mouth two times a day  #60 x 1   Entered and Authorized by:   Ruthe Mannan MD   Signed by:   Ruthe Mannan MD on 09/14/2009   Method used:   Print then Give to Patient   RxID:   [id number] VICODIN 5-500 MG  TABS (HYDROCODONE-ACETAMINOPHEN) Take one by mouth four times a day  #200 x 2   Entered and Authorized by:   Ruthe Mannan MD   Signed by:   Ruthe Mannan MD on 09/14/2009   Method used:   Print then Give to Patient   RxID:   [id number] FLEXERIL 5 MG  TABS (CYCLOBENZAPRINE HCL) Take 1/2 - 1 by mouth four times a day as needed  #60 x  1   Entered and Authorized by:   Ruthe Mannan MD   Signed by:   Ruthe Mannan MD on 09/14/2009   Method used:   Electronically to        [facility]* (retail)       [address]       [place], [place]  [address]       Ph: [phone]       Fax: [phone]   RxID:   [id number] ALBUTEROL 90 MCG/ACT  AERS (ALBUTEROL) Use as needed  #1 x 1   Entered and Authorized by:   Ruthe Mannan MD   Signed by:   Ruthe Mannan MD on 09/14/2009   Method used:   Electronically to        [facility]* (retail)       [address]       [place], [place]  [address]       Ph: [phone]       Fax: [phone]   RxID:   [id number] HYDROCHLOROTHIAZIDE 25 MG  TABS (HYDROCHLOROTHIAZIDE) 1/2 to 1 once daily for swelling  #30 x 6   Entered and Authorized by:   Ruthe Mannan MD   Signed by:   Ruthe Mannan MD on 09/14/2009   Method used:   Electronically to        [facility]* (retail)       [address]       [place], [place]  [address]       Ph: [phone]       Fax: [phone]   RxID:   [id number] TRAMADOL HCL 50 MG  TABS (TRAMADOL HCL) 1-2 every 6 hrs as needed pain  #240 x 6   Entered and Authorized by:   Ruthe Mannan MD   Signed by:   Ruthe Mannan MD  on 09/14/2009   Method used:   Electronically to        [facility]* (retail)       [address]       [place], [place]  [address]       Ph: [phone]       Fax: [phone]   RxID:   [id number] TIZANIDINE HCL 2 MG TABS (TIZANIDINE HCL) 1 two times a day as needed muscle spasms  #60 x 6   Entered and Authorized by:   Ruthe Mannan MD   Signed by:   Ruthe Mannan MD on 09/14/2009   Method used:   Electronically to        [facility]* (retail)       [address]       [place], [place]  [address]       Ph: [phone]       Fax: [phone]   RxID:   [id number]   Current Allergies (reviewed today): ! CLONIDINE HCL (CLONIDINE HCL) ! SEPTRA DS (SULFAMETHOXAZOLE-TRIMETHOPRIM) * CODEINE * MORPHINE * PERCOCET * PREDNISONE * SULFA  Last PAP:  normal (05/06/2008 1:06:15 PM) PAP Next Due:  2 yr Last Mammogram:  BI-RADS CATEGORY 2:  Benign finding(s).^MM DIGITAL DIAGNOSTIC BILAT (05/20/2009 2:45:00 PM) Mammogram Result Date:  05/20/2009 Mammogram Result:  normal Mammogram Next Due:  1 yr

## 2010-07-19 NOTE — Letter (Signed)
Summary: [facility]  [facility]   Imported By: Lanelle Bal 10/28/2009 14:04:21  _____________________________________________________________________  External Attachment:    Type:   Image     Comment:   External Document

## 2010-07-19 NOTE — Progress Notes (Signed)
Summary: Rx Tizanidine  Phone Note Refill Request Call back at [phone] Message from:  [facility] on April 29, 2010 3:35 PM  Refills Requested: Medication #1:  TIZANIDINE HCL 2 MG TABS 1 two times a day as needed muscle spasms   Last Refilled: 03/31/2010 Received faxed refill request please advise.   Method Requested: Telephone to Pharmacy Initial call taken by: Linde Gillis CMA Duncan Dull),  April 29, 2010 3:35 PM    Prescriptions: TIZANIDINE HCL 2 MG TABS (TIZANIDINE HCL) 1 two times a day as needed muscle spasms  #60 x 6   Entered and Authorized by:   Ruthe Mannan MD   Signed by:   Ruthe Mannan MD on 04/29/2010   Method used:   Electronically to        [facility]* (retail)       [address]       [place], [place]  [address]       Ph: [phone]       Fax: [phone]   RxID:   [id number]

## 2010-07-19 NOTE — Progress Notes (Signed)
Summary: refill request for lidoderm  Phone Note Refill Request Message from:  Fax from Pharmacy  Refills Requested: Medication #1:  LIDODERM OINT apply thinnly to tender areas on L shoulder 2-3 times daily Faxed request from [facility].  Initial call taken by: Lowella Petties CMA,  March 01, 2010 8:37 AM  Follow-up for Phone Call        [facility] and see how much was prescribed last time?  I think this may have been compounded. Follow-up by: Eustaquio Boyden  MD,  March 01, 2010 8:55 AM  Additional Follow-up for Phone Call Additional follow up Details #1::        It was Lidoderm 2% ointment. 1 30 gram tube with 3 R/F. Last filled in 2009. I guess she had a flare up. Additional Follow-up by: Janee Morn CMA Duncan Dull),  March 01, 2010 9:09 AM    Additional Follow-up for Phone Call Additional follow up Details #2::    tahnks. done.  please call in and inform patient.  (could not sent electronically) Follow-up by: Eustaquio Boyden  MD,  March 01, 2010 9:11 AM  Additional Follow-up for Phone Call Additional follow up Details #3:: Details for Additional Follow-up Action Taken: Called Rx in as directed. Left message on voicemail advising patient Rx had been refilled Additional Follow-up by: Janee Morn CMA Duncan Dull),  March 01, 2010 9:18 AM  New/Updated Medications: * LIDODERM OINT apply thinnly to tender areas on L shoulder 2-3 times daily, 30gm tube Prescriptions: LIDODERM OINT apply thinnly to tender areas on L shoulder 2-3 times daily, 30gm tube  #1 x 3   Entered and Authorized by:   Eustaquio Boyden  MD   Signed by:   Eustaquio Boyden  MD on 03/01/2010   Method used:   Telephoned to ...       [facility]* (retail)       [address]       [place], [place]  [address]       Ph: [phone]       Fax: [phone]   RxID:   [id number]

## 2010-07-19 NOTE — Letter (Signed)
Summary: [facility] / DR. Darl Pikes   [facility] / DR. Alexander Bergeron   Imported By: Carin Primrose 08/03/2009 10:50:25  _____________________________________________________________________  External Attachment:    Type:   Image     Comment:   External Document

## 2010-07-19 NOTE — Letter (Signed)
Summary: [facility]  [facility]   Imported By: Lanelle Bal 01/26/2010 13:52:31  _____________________________________________________________________  External Attachment:    Type:   Image     Comment:   External Document

## 2010-07-21 NOTE — Progress Notes (Signed)
Summary: tramadol  Phone Note Refill Request Message from:  Fax from Pharmacy on July 13, 2010 1:53 PM  Refills Requested: Medication #1:  TRAMADOL HCL 50 MG  TABS 1-2 every 6 hrs as needed pain   Last Refilled: 06/30/2010 Refill request from [facility]. [phone]  Initial call taken by: Melody Comas,  July 13, 2010 1:53 PM    Prescriptions: TRAMADOL HCL 50 MG  TABS (TRAMADOL HCL) 1-2 every 6 hrs as needed pain  #240 x 6   Entered and Authorized by:   Ruthe Mannan MD   Signed by:   Ruthe Mannan MD on 07/13/2010   Method used:   Electronically to        [facility]* (retail)       [address]       [place], [place]  [address]       Ph: [phone]       Fax: [phone]   RxID:   [id number]

## 2010-07-21 NOTE — Progress Notes (Signed)
Summary: lidoderm ointment  Phone Note Refill Request   Refills Requested: Medication #1:  LIDODERM OINT apply thinnly to tender areas on L shoulder 2-3 times daily   Last Refilled: 06/03/2010 Refill request from [facility]. [phone]  Initial call taken by: Melody Comas,  June 30, 2010 2:56 PM  Follow-up for Phone Call        Rx called to pharmacy Follow-up by: Linde Gillis CMA Duncan Dull),  June 30, 2010 3:18 PM    Prescriptions: LIDODERM OINT apply thinnly to tender areas on L shoulder 2-3 times daily, 30gm tube  #1 x 3   Entered and Authorized by:   Ruthe Mannan MD   Signed by:   Ruthe Mannan MD on 06/30/2010   Method used:   Telephoned to ...       [facility]* (retail)       [address]       [place], [place]  [address]       Ph: [phone]       Fax: [phone]   RxID:   [id number]

## 2010-07-29 ENCOUNTER — Telehealth: Payer: Self-pay | Admitting: Family Medicine

## 2010-08-04 NOTE — Progress Notes (Signed)
Summary: tramadol   Phone Note Refill Request Message from:  Fax from Pharmacy on July 29, 2010 1:17 PM  Refills Requested: Medication #1:  TRAMADOL HCL 50 MG  TABS 1-2 every 6 hrs as needed pain   Last Refilled: 06/30/2010 Refill request from [facility]. [phone]  Initial call taken by: Melody Comas,  July 29, 2010 1:18 PM  Follow-up for Phone Call        sent in 07/13/2010 with 6 refills.  [facility] to verify? Follow-up by: Eustaquio Boyden  MD,  July 29, 2010 1:31 PM  Additional Follow-up for Phone Call Additional follow up Details #1::        Spoke with pharmacy. It was an error. They did have the 6 refills.  Additional Follow-up by: Janee Morn CMA (AAMA),  July 29, 2010 1:40 PM

## 2010-08-10 ENCOUNTER — Other Ambulatory Visit (INDEPENDENT_AMBULATORY_CARE_PROVIDER_SITE_OTHER): Payer: PRIVATE HEALTH INSURANCE

## 2010-08-10 ENCOUNTER — Encounter: Payer: Self-pay | Admitting: Family Medicine

## 2010-08-10 ENCOUNTER — Encounter (INDEPENDENT_AMBULATORY_CARE_PROVIDER_SITE_OTHER): Payer: Self-pay | Admitting: *Deleted

## 2010-08-10 ENCOUNTER — Other Ambulatory Visit: Payer: PRIVATE HEALTH INSURANCE

## 2010-08-10 DIAGNOSIS — Z113 Encounter for screening for infections with a predominantly sexual mode of transmission: Secondary | ICD-10-CM

## 2010-08-11 ENCOUNTER — Encounter: Payer: Self-pay | Admitting: Family Medicine

## 2010-08-12 LAB — CONVERTED CEMR LAB
Chlamydia, Swab/Urine, PCR: NEGATIVE
GC Probe Amp, Urine: NEGATIVE

## 2010-08-15 LAB — CONVERTED CEMR LAB: HIV: NONREACTIVE

## 2010-09-29 ENCOUNTER — Other Ambulatory Visit: Payer: Self-pay | Admitting: *Deleted

## 2010-09-30 ENCOUNTER — Other Ambulatory Visit: Payer: Self-pay | Admitting: *Deleted

## 2010-09-30 ENCOUNTER — Telehealth: Payer: Self-pay | Admitting: *Deleted

## 2010-09-30 MED ORDER — HYDROCODONE-ACETAMINOPHEN 5-500 MG PO TABS: 1.0000 | ORAL_TABLET | ORAL | Status: DC | PRN

## 2010-09-30 MED ORDER — HYDROCODONE-ACETAMINOPHEN 2.5-500 MG PO TABS: ORAL_TABLET | ORAL | Status: DC

## 2010-09-30 NOTE — Telephone Encounter (Signed)
Phone call from [place].  Vicodin was called in today as 2.5/500, one qid, # 90.  Pt has been getting 5/500, taking one qid, and getting 100.  Please advise pharmacy on correct strength and number.

## 2010-09-30 NOTE — Telephone Encounter (Signed)
Please adjust to correct dose and send refill request back to me. Thanks

## 2010-09-30 NOTE — Telephone Encounter (Signed)
[facility] called to [facility]. 

## 2010-09-30 NOTE — Telephone Encounter (Signed)
Corrected Rx called to [facility].

## 2010-10-04 ENCOUNTER — Other Ambulatory Visit: Payer: Self-pay | Admitting: *Deleted

## 2010-10-04 NOTE — Telephone Encounter (Signed)
Opened in error

## 2010-10-19 ENCOUNTER — Other Ambulatory Visit (INDEPENDENT_AMBULATORY_CARE_PROVIDER_SITE_OTHER): Payer: PRIVATE HEALTH INSURANCE | Admitting: Family Medicine

## 2010-10-19 ENCOUNTER — Other Ambulatory Visit: Payer: Self-pay | Admitting: Family Medicine

## 2010-10-19 DIAGNOSIS — Z13220 Encounter for screening for lipoid disorders: Secondary | ICD-10-CM

## 2010-10-19 DIAGNOSIS — Z0000 Encounter for general adult medical examination without abnormal findings: Secondary | ICD-10-CM

## 2010-10-19 DIAGNOSIS — Z136 Encounter for screening for cardiovascular disorders: Secondary | ICD-10-CM

## 2010-10-19 DIAGNOSIS — E559 Vitamin D deficiency, unspecified: Secondary | ICD-10-CM

## 2010-10-19 DIAGNOSIS — Z79899 Other long term (current) drug therapy: Secondary | ICD-10-CM

## 2010-10-19 DIAGNOSIS — D649 Anemia, unspecified: Secondary | ICD-10-CM

## 2010-10-19 LAB — LIPID PANEL
Cholesterol: 206 mg/dL — ABNORMAL HIGH (ref 0–200)
Triglycerides: 86 mg/dL (ref 0.0–149.0)
VLDL: 17.2 mg/dL (ref 0.0–40.0)

## 2010-10-19 LAB — CBC WITH DIFFERENTIAL/PLATELET
Basophils Absolute: 0 10*3/uL (ref 0.0–0.1)
Eosinophils Relative: 2.1 % (ref 0.0–5.0)
HCT: 34.3 % — ABNORMAL LOW (ref 36.0–46.0)
Lymphs Abs: 1.6 10*3/uL (ref 0.7–4.0)
Monocytes Relative: 8 % (ref 3.0–12.0)
Neutrophils Relative %: 54.7 % (ref 43.0–77.0)
Platelets: 319 10*3/uL (ref 150.0–400.0)
RDW: 16.1 % — ABNORMAL HIGH (ref 11.5–14.6)
WBC: 4.8 10*3/uL (ref 4.5–10.5)

## 2010-10-19 LAB — BASIC METABOLIC PANEL
BUN: 13 mg/dL (ref 6–23)
Calcium: 9.4 mg/dL (ref 8.4–10.5)
Creatinine, Ser: 0.6 mg/dL (ref 0.4–1.2)
GFR: 109.84 mL/min (ref >60.00)

## 2010-10-20 LAB — VITAMIN D 25 HYDROXY (VIT D DEFICIENCY, FRACTURES): Vit D, 25-Hydroxy: 34 ng/mL (ref 30–89)

## 2010-10-26 ENCOUNTER — Encounter: Payer: Self-pay | Admitting: Family Medicine

## 2010-10-26 LAB — HM MAMMOGRAPHY

## 2010-10-27 ENCOUNTER — Encounter: Payer: Self-pay | Admitting: Family Medicine

## 2010-10-27 ENCOUNTER — Ambulatory Visit (INDEPENDENT_AMBULATORY_CARE_PROVIDER_SITE_OTHER): Payer: PRIVATE HEALTH INSURANCE | Admitting: Family Medicine

## 2010-10-27 ENCOUNTER — Other Ambulatory Visit (HOSPITAL_COMMUNITY)
Admission: RE | Admit: 2010-10-27 | Discharge: 2010-10-27 | Disposition: A | Discharge status: Home / Self Care | Payer: PRIVATE HEALTH INSURANCE | Source: Ambulatory Visit | Attending: Family Medicine | Admitting: Family Medicine

## 2010-10-27 VITALS — BP 110/70 | HR 70 | Temp 98.6°F | Ht 65.0 in | Wt 155.0 lb

## 2010-10-27 DIAGNOSIS — K4090 Unilateral inguinal hernia, without obstruction or gangrene, not specified as recurrent: Secondary | ICD-10-CM | POA: Insufficient documentation

## 2010-10-27 DIAGNOSIS — Z1211 Encounter for screening for malignant neoplasm of colon: Secondary | ICD-10-CM

## 2010-10-27 DIAGNOSIS — Z01419 Encounter for gynecological examination (general) (routine) without abnormal findings: Secondary | ICD-10-CM | POA: Insufficient documentation

## 2010-10-27 DIAGNOSIS — Z0000 Encounter for general adult medical examination without abnormal findings: Secondary | ICD-10-CM

## 2010-10-27 MED ORDER — HYDROCHLOROTHIAZIDE 25 MG PO TABS: 25.0000 mg | ORAL_TABLET | ORAL | Status: DC | PRN

## 2010-10-27 MED ORDER — AMPHETAMINE-DEXTROAMPHETAMINE 5 MG PO TABS: 5.0000 mg | ORAL_TABLET | Freq: Every day | ORAL | Status: AC

## 2010-10-27 MED ORDER — AMITRIPTYLINE HCL 10 MG PO TABS: 10.0000 mg | ORAL_TABLET | Freq: Every day | ORAL | Status: DC

## 2010-10-27 MED ORDER — HYDROCODONE-ACETAMINOPHEN 5-500 MG PO TABS: 1.0000 | ORAL_TABLET | ORAL | Status: DC | PRN

## 2010-10-27 NOTE — Progress Notes (Signed)
50 yo with complex medical history, new to me, here for CPX.  Pain syndrome- very complex.  Has polydactyl and other associated abnormalities requiring multiple surgeries per year with specialist at [facility]. Most recent surgery in right arm/wrist a few weeks ago.   Current pain regimen is working "ok."  Tries to stay active but in pain all the time. Take Vicodin 5-500 four times daily, Tramadol 100 mg every 6 hours, Flexeril and Tizanidine 2 mg two times a day as needed.  Also has a Lidoderm patch. A lot of her pain is neuropathic.  Could not tolerate Gapabentin.    LE edema- related to above issue.  Well controlled on HCTZ.  Well woman- due for pap smear and colonoscopy.   She would prefer to have IFOB only first. UTD mammogram.  Has noticed some small swelling in left inguinal area.  Nontender.    The PMH, PSH, Social History, Family History, Medications, and allergies have been reviewed in [facility], and have been updated if relevant.   Review of Systems       See HPI General:  Denies chills, fatigue, fever, and malaise. Eyes:  Denies blurring. ENT:  Denies difficulty swallowing. CV:  Denies chest pain or discomfort. Resp:  Denies shortness of breath. GI:  Denies abdominal pain, bloody stools, and change in bowel habits. Derm:  Denies rash. Psych:  Denies anxiety and depression. Endo:  Denies cold intolerance and heat intolerance.  Physical Exam BP 110/70  Pulse 70  Temp(Src) 98.6 F (37 C) (Oral)  Ht 5\' 5"  (1.651 m)  Wt 155 lb (70.308 kg)  BMI 25.79 kg/m2 General:  Well-developed,well-nourished,in no acute distress; alert,appropriate and cooperative throughout examination Head:  normocephalic and atraumatic.   Eyes:  vision grossly intact, pupils equal, pupils round, and pupils reactive to light.   Ears:  R ear normal and L ear normal.   Nose:  no external deformity.   Mouth:  good dentition.   Neck:  No deformities, masses, or tenderness noted. Breasts:  No mass, nodules,  thickening, tenderness, bulging, retraction, inflamation, nipple discharge or skin changes noted.   Lungs:  Normal respiratory effort, chest expands symmetrically. Lungs are clear to auscultation, no crackles or wheezes. Heart:  Normal rate and regular rhythm. S1 and S2 normal without gallop, murmur, click, rub or other extra sounds. Abdomen:  Bowel sounds positive,abdomen soft and non-tender without masses, organomegaly or hernias noted. Rectal:  no external abnormalities.   Genitalia:  Pelvic Exam:        External: normal female genitalia without lesions or masses        Vagina: normal without lesions or masses        Cervix: normal without lesions or masses        Adnexa: normal bimanual exam without masses or fullness        Uterus: normal by palpation        Pap smear: performed Small palpable protrusion left inguinal area Msk:  No deformity or scoliosis noted of thoracic or lumbar spine.    Extremities:  no edema eeither lower leg large varicose vein R thigh, not tender Right forearm in cast Neurologic:  alert & oriented X3 and gait normal.   Skin:  Intact without suspicious lesions or rashes Cervical Nodes:  No lymphadenopathy noted Axillary Nodes:  No palpable lymphadenopathy Psych:  Cognition and judgment appear intact. Alert and cooperative with normal attention span and concentration. No apparent delusions, illusions, hallucinations

## 2010-10-27 NOTE — Assessment & Plan Note (Signed)
Reviewed preventive care protocols, scheduled due services, and updated immunizations Discussed nutrition, exercise, diet, and healthy lifestyle.  IFOB ordered today. Pap performed today.

## 2010-10-27 NOTE — Assessment & Plan Note (Signed)
New.  Discussed getting imaging to evaluate further.  She is going to see her doctors at [facility] today and will bring it up to them and call me tomorrow.

## 2010-11-04 ENCOUNTER — Encounter: Payer: Self-pay | Admitting: *Deleted

## 2010-11-24 ENCOUNTER — Other Ambulatory Visit: Payer: Self-pay | Admitting: *Deleted

## 2010-11-24 MED ORDER — HYDROCODONE-ACETAMINOPHEN 5-500 MG PO TABS: 1.0000 | ORAL_TABLET | ORAL | Status: DC | PRN

## 2010-11-28 ENCOUNTER — Other Ambulatory Visit: Payer: Self-pay | Admitting: *Deleted

## 2010-11-28 NOTE — Telephone Encounter (Signed)
[facility] called to [facility]. 

## 2010-11-29 NOTE — Telephone Encounter (Signed)
Opened in error

## 2010-12-13 ENCOUNTER — Other Ambulatory Visit: Payer: Self-pay

## 2010-12-14 ENCOUNTER — Other Ambulatory Visit: Payer: PRIVATE HEALTH INSURANCE

## 2010-12-26 ENCOUNTER — Telehealth: Payer: Self-pay | Admitting: Radiology

## 2010-12-26 NOTE — Telephone Encounter (Signed)
ifob cancelled by

## 2010-12-26 NOTE — Telephone Encounter (Signed)
FYI Patients ifob cancelled by [facility], no sample received.

## 2010-12-27 ENCOUNTER — Other Ambulatory Visit: Payer: Self-pay | Admitting: *Deleted

## 2010-12-27 MED ORDER — HYDROCODONE-ACETAMINOPHEN 5-500 MG PO TABS: 1.0000 | ORAL_TABLET | ORAL | Status: DC | PRN

## 2010-12-29 NOTE — Telephone Encounter (Signed)
[facility] called to [facility]. 

## 2010-12-30 ENCOUNTER — Other Ambulatory Visit: Payer: Self-pay | Admitting: *Deleted

## 2010-12-30 MED ORDER — TIZANIDINE HCL 2 MG PO TABS: 2.0000 mg | ORAL_TABLET | Freq: Two times a day (BID) | ORAL | Status: DC

## 2011-01-26 ENCOUNTER — Other Ambulatory Visit: Payer: Self-pay | Admitting: *Deleted

## 2011-01-26 MED ORDER — HYDROCODONE-ACETAMINOPHEN 5-500 MG PO TABS: 1.0000 | ORAL_TABLET | ORAL | Status: DC | PRN

## 2011-01-26 NOTE — Telephone Encounter (Signed)
[facility] called to [facility]. 

## 2011-01-26 NOTE — Telephone Encounter (Signed)
Ok to refill and use my stamp.

## 2011-02-23 ENCOUNTER — Other Ambulatory Visit: Payer: Self-pay

## 2011-02-23 NOTE — Telephone Encounter (Addendum)
[facility] faxed refill request for Hydrocodone -APAP 5-500 mg. Last filled 01/26/11. Also sent refill for Tizanidine Hcl 2mg . Refill request is in your in box. Thank you.

## 2011-02-24 MED ORDER — TIZANIDINE HCL 2 MG PO TABS: 2.0000 mg | ORAL_TABLET | Freq: Two times a day (BID) | ORAL | Status: DC

## 2011-02-24 MED ORDER — HYDROCODONE-ACETAMINOPHEN 5-500 MG PO TABS: 1.0000 | ORAL_TABLET | ORAL | Status: DC | PRN

## 2011-02-24 NOTE — Telephone Encounter (Signed)
Rx's called to [facility]. 

## 2011-02-24 NOTE — Telephone Encounter (Signed)
Okay hydrocodone #100 x 0 Tizanidine 2mg  #60 x 0

## 2011-02-28 ENCOUNTER — Other Ambulatory Visit: Payer: Self-pay | Admitting: *Deleted

## 2011-02-28 MED ORDER — AMITRIPTYLINE HCL 10 MG PO TABS: 10.0000 mg | ORAL_TABLET | Freq: Every day | ORAL | Status: DC

## 2011-03-01 NOTE — Telephone Encounter (Signed)
[facility] called to [facility]. 

## 2011-03-08 ENCOUNTER — Other Ambulatory Visit: Payer: Self-pay | Admitting: *Deleted

## 2011-03-08 MED ORDER — HYDROCHLOROTHIAZIDE 25 MG PO TABS: ORAL_TABLET | ORAL | Status: DC

## 2011-03-13 ENCOUNTER — Encounter: Payer: Self-pay | Admitting: Family Medicine

## 2011-03-13 ENCOUNTER — Ambulatory Visit (INDEPENDENT_AMBULATORY_CARE_PROVIDER_SITE_OTHER): Payer: PRIVATE HEALTH INSURANCE | Admitting: Family Medicine

## 2011-03-13 VITALS — BP 120/80 | HR 75 | Temp 98.4°F | Wt 152.2 lb

## 2011-03-13 DIAGNOSIS — Q690 Accessory finger(s): Secondary | ICD-10-CM

## 2011-03-13 DIAGNOSIS — G8929 Other chronic pain: Secondary | ICD-10-CM

## 2011-03-13 DIAGNOSIS — G9050 Complex regional pain syndrome I, unspecified: Secondary | ICD-10-CM

## 2011-03-13 DIAGNOSIS — K4190 Unilateral femoral hernia, without obstruction or gangrene, not specified as recurrent: Secondary | ICD-10-CM | POA: Insufficient documentation

## 2011-03-13 NOTE — Progress Notes (Signed)
50 yo with complex medical history here to have forms filled out for work.    Had left femoral hernia surgery repair several weeks ago. This worsened her Reflex Sympathetic dystrophy. Cannot return to work. Works at [employer], cannot lift, bend kneel. Will be applying for SS disability if her symptoms do not improve in near future.    Pain syndrome- very complex.  Has polydactyl and other associated abnormalities requiring multiple surgeries per year with specialist at [facility].   The PMH, PSH, Social History, Family History, Medications, and allergies have been reviewed in [facility] and have been updated if relevant.   Review of Systems       See HPI  Physical Exam BP 120/80  Pulse 75  Temp(Src) 98.4 F (36.9 C) (Oral)  Wt 152 lb 4 oz (69.06 kg) General:  Well-developed,well-nourished,in no acute distress; alert,appropriate and cooperative throughout examination Psych:  Cognition and judgment appear intact. Alert and cooperative with normal attention span and concentration. No apparent delusions, illusions, hallucinations  Assessment and Plan:  1. REFLEX SYMPATHETIC DYSTROPHY   >25 min spent with face to face with patient, >50% counseling and/or coordinating care and filling out paper work.     2. Femoral hernia   Post op.  She truly cannot perform her job at this point.  [employer] paper work filled out and faxed.

## 2011-03-21 ENCOUNTER — Other Ambulatory Visit: Payer: Self-pay | Admitting: *Deleted

## 2011-03-21 NOTE — Telephone Encounter (Signed)
Both medications last refilled on 02/24/2011, please advise.

## 2011-03-22 MED ORDER — TIZANIDINE HCL 2 MG PO TABS: 2.0000 mg | ORAL_TABLET | Freq: Two times a day (BID) | ORAL | Status: DC

## 2011-03-22 MED ORDER — HYDROCODONE-ACETAMINOPHEN 5-500 MG PO TABS: 1.0000 | ORAL_TABLET | ORAL | Status: DC | PRN

## 2011-03-22 NOTE — Telephone Encounter (Signed)
Rx's called to [facility]. 

## 2011-03-23 ENCOUNTER — Encounter: Payer: Self-pay | Admitting: Family Medicine

## 2011-03-30 ENCOUNTER — Other Ambulatory Visit: Payer: Self-pay

## 2011-03-30 MED ORDER — AMITRIPTYLINE HCL 10 MG PO TABS: 10.0000 mg | ORAL_TABLET | Freq: Every day | ORAL | Status: DC

## 2011-03-30 NOTE — Telephone Encounter (Signed)
Medication phoned to [facility] as instructed.  

## 2011-03-30 NOTE — Telephone Encounter (Signed)
[facility] faxed refill request Amitriptyline 10 mg. Last refilled 03/01/11.Please advise.

## 2011-04-06 DIAGNOSIS — R1032 Left lower quadrant pain: Secondary | ICD-10-CM | POA: Insufficient documentation

## 2011-04-06 DIAGNOSIS — R52 Pain, unspecified: Secondary | ICD-10-CM | POA: Insufficient documentation

## 2011-04-24 ENCOUNTER — Other Ambulatory Visit: Payer: Self-pay | Admitting: *Deleted

## 2011-04-24 MED ORDER — TIZANIDINE HCL 2 MG PO TABS: 2.0000 mg | ORAL_TABLET | Freq: Two times a day (BID) | ORAL | Status: DC

## 2011-04-24 MED ORDER — HYDROCODONE-ACETAMINOPHEN 5-500 MG PO TABS: 1.0000 | ORAL_TABLET | ORAL | Status: DC | PRN

## 2011-04-24 NOTE — Telephone Encounter (Signed)
Rx's called to [facility]. 

## 2011-04-26 DIAGNOSIS — G540 Brachial plexus disorders: Secondary | ICD-10-CM | POA: Insufficient documentation

## 2011-04-26 DIAGNOSIS — M5137 Other intervertebral disc degeneration, lumbosacral region: Secondary | ICD-10-CM | POA: Insufficient documentation

## 2011-04-26 DIAGNOSIS — Q669 Congenital deformity of feet, unspecified, unspecified foot: Secondary | ICD-10-CM | POA: Insufficient documentation

## 2011-04-26 DIAGNOSIS — G8929 Other chronic pain: Secondary | ICD-10-CM | POA: Insufficient documentation

## 2011-05-01 ENCOUNTER — Other Ambulatory Visit: Payer: Self-pay | Admitting: *Deleted

## 2011-05-01 MED ORDER — TRAMADOL HCL 50 MG PO TABS: ORAL_TABLET | ORAL | Status: DC

## 2011-05-01 NOTE — Telephone Encounter (Signed)
Received faxed refill request from pharmacy. Is it okay to refill medication? 

## 2011-05-22 ENCOUNTER — Other Ambulatory Visit: Payer: Self-pay | Admitting: *Deleted

## 2011-05-22 MED ORDER — HYDROCODONE-ACETAMINOPHEN 5-500 MG PO TABS: 1.0000 | ORAL_TABLET | ORAL | Status: DC | PRN

## 2011-05-22 NOTE — Telephone Encounter (Signed)
[facility] called to [facility]. 

## 2011-05-30 ENCOUNTER — Other Ambulatory Visit: Payer: Self-pay | Admitting: *Deleted

## 2011-05-30 MED ORDER — TIZANIDINE HCL 2 MG PO TABS: 2.0000 mg | ORAL_TABLET | Freq: Two times a day (BID) | ORAL | Status: DC

## 2011-05-31 ENCOUNTER — Other Ambulatory Visit: Payer: Self-pay | Admitting: Family Medicine

## 2011-05-31 DIAGNOSIS — Z1231 Encounter for screening mammogram for malignant neoplasm of breast: Secondary | ICD-10-CM

## 2011-06-01 ENCOUNTER — Other Ambulatory Visit: Payer: Self-pay | Admitting: *Deleted

## 2011-06-01 MED ORDER — TRAMADOL HCL 50 MG PO TABS: ORAL_TABLET | ORAL | Status: DC

## 2011-06-01 NOTE — Telephone Encounter (Signed)
Last refill 05/13/2011.

## 2011-06-02 NOTE — Telephone Encounter (Signed)
Rx sent electronically by Dr. Aron. 

## 2011-06-12 ENCOUNTER — Other Ambulatory Visit: Payer: Self-pay | Admitting: Internal Medicine

## 2011-06-12 MED ORDER — TRAMADOL HCL 50 MG PO TABS: ORAL_TABLET | ORAL | Status: DC

## 2011-06-16 ENCOUNTER — Encounter: Payer: Self-pay | Admitting: *Deleted

## 2011-06-16 ENCOUNTER — Ambulatory Visit
Admission: RE | Admit: 2011-06-16 | Discharge: 2011-06-16 | Disposition: A | Discharge status: Home / Self Care | Payer: PRIVATE HEALTH INSURANCE | Source: Ambulatory Visit | Attending: Family Medicine | Admitting: Family Medicine

## 2011-06-16 DIAGNOSIS — Z1231 Encounter for screening mammogram for malignant neoplasm of breast: Secondary | ICD-10-CM

## 2011-06-19 ENCOUNTER — Other Ambulatory Visit: Payer: Self-pay | Admitting: *Deleted

## 2011-06-19 NOTE — Telephone Encounter (Signed)
OK to refill in Dr. Aron's absence? 

## 2011-06-20 MED ORDER — HYDROCODONE-ACETAMINOPHEN 5-500 MG PO TABS: 1.0000 | ORAL_TABLET | ORAL | Status: DC | PRN

## 2011-06-20 MED ORDER — TIZANIDINE HCL 2 MG PO TABS: 2.0000 mg | ORAL_TABLET | Freq: Two times a day (BID) | ORAL | Status: DC

## 2011-06-20 NOTE — Telephone Encounter (Signed)
Ok to do.  plz phone in. 

## 2011-06-21 NOTE — Telephone Encounter (Signed)
Rx's called to [facility]. 

## 2011-07-03 ENCOUNTER — Other Ambulatory Visit: Payer: Self-pay | Admitting: *Deleted

## 2011-07-04 MED ORDER — TRAMADOL HCL 50 MG PO TABS: ORAL_TABLET | ORAL | Status: DC

## 2011-07-05 ENCOUNTER — Other Ambulatory Visit: Payer: Self-pay | Admitting: *Deleted

## 2011-07-05 MED ORDER — HYDROCODONE-ACETAMINOPHEN 5-500 MG PO TABS: 1.0000 | ORAL_TABLET | ORAL | Status: DC | PRN

## 2011-07-05 NOTE — Telephone Encounter (Signed)
Rx called in as directed.   

## 2011-07-05 NOTE — Telephone Encounter (Signed)
OK to refill

## 2011-07-12 DIAGNOSIS — G5610 Other lesions of median nerve, unspecified upper limb: Secondary | ICD-10-CM | POA: Insufficient documentation

## 2011-07-28 ENCOUNTER — Other Ambulatory Visit: Payer: Self-pay | Admitting: *Deleted

## 2011-07-28 MED ORDER — HYDROCODONE-ACETAMINOPHEN 5-500 MG PO TABS: 1.0000 | ORAL_TABLET | ORAL | Status: DC | PRN

## 2011-07-28 MED ORDER — TIZANIDINE HCL 2 MG PO TABS: 2.0000 mg | ORAL_TABLET | Freq: Two times a day (BID) | ORAL | Status: DC

## 2011-07-28 NOTE — Telephone Encounter (Signed)
[facility] called to [facility]. 

## 2011-08-01 ENCOUNTER — Other Ambulatory Visit: Payer: Self-pay | Admitting: *Deleted

## 2011-08-01 MED ORDER — TRAMADOL HCL 50 MG PO TABS: ORAL_TABLET | ORAL | Status: DC

## 2011-08-17 DIAGNOSIS — G5620 Lesion of ulnar nerve, unspecified upper limb: Secondary | ICD-10-CM | POA: Insufficient documentation

## 2011-08-17 DIAGNOSIS — G5690 Unspecified mononeuropathy of unspecified upper limb: Secondary | ICD-10-CM | POA: Insufficient documentation

## 2011-08-29 ENCOUNTER — Other Ambulatory Visit: Payer: Self-pay | Admitting: *Deleted

## 2011-08-29 MED ORDER — TIZANIDINE HCL 2 MG PO TABS: 2.0000 mg | ORAL_TABLET | Freq: Two times a day (BID) | ORAL | Status: DC

## 2011-08-29 NOTE — Telephone Encounter (Signed)
Last filled 07-28-2011

## 2011-08-29 NOTE — Telephone Encounter (Signed)
Rx called to brittany at [facility]

## 2011-08-31 ENCOUNTER — Other Ambulatory Visit: Payer: Self-pay

## 2011-08-31 MED ORDER — TRAMADOL HCL 50 MG PO TABS: ORAL_TABLET | ORAL | Status: DC

## 2011-08-31 NOTE — Telephone Encounter (Signed)
[facility] faxed refill request Tramadol 50 mg. Last filled #60 on 08/01/11 and pt last seen 03/13/11. Please advise.

## 2011-08-31 NOTE — Telephone Encounter (Signed)
Medication phoned to [facility] as instructed.  

## 2011-09-18 ENCOUNTER — Other Ambulatory Visit: Payer: Self-pay | Admitting: *Deleted

## 2011-09-18 MED ORDER — TRAMADOL HCL 50 MG PO TABS: ORAL_TABLET | ORAL | Status: DC

## 2011-09-18 NOTE — Telephone Encounter (Signed)
Faxed request from [facility], last filled 08/31/11 for # 60.

## 2011-09-28 ENCOUNTER — Other Ambulatory Visit: Payer: Self-pay | Admitting: *Deleted

## 2011-09-28 MED ORDER — TRAMADOL HCL 50 MG PO TABS: ORAL_TABLET | ORAL | Status: DC

## 2011-09-28 NOTE — Telephone Encounter (Signed)
Faxed request from [facility], last filled 09/18/11.

## 2011-10-06 ENCOUNTER — Other Ambulatory Visit: Payer: Self-pay | Admitting: *Deleted

## 2011-10-06 MED ORDER — TIZANIDINE HCL 2 MG PO TABS: 2.0000 mg | ORAL_TABLET | Freq: Two times a day (BID) | ORAL | Status: DC

## 2011-10-06 NOTE — Telephone Encounter (Signed)
[facility]. 

## 2011-10-06 NOTE — Telephone Encounter (Signed)
Faxed request from [facility], last filled #60 on 08/29/11.

## 2011-10-09 ENCOUNTER — Other Ambulatory Visit: Payer: Self-pay | Admitting: *Deleted

## 2011-10-09 MED ORDER — HYDROCODONE-ACETAMINOPHEN 5-500 MG PO TABS: 1.0000 | ORAL_TABLET | ORAL | Status: DC | PRN

## 2011-10-09 NOTE — Telephone Encounter (Signed)
Medicine called to pharmacy. 

## 2011-10-09 NOTE — Telephone Encounter (Signed)
Faxed refill request from [facility], last filled 07/28/11.

## 2011-10-25 DIAGNOSIS — M93959 Osteochondropathy, unspecified, unspecified thigh: Secondary | ICD-10-CM | POA: Insufficient documentation

## 2011-10-27 ENCOUNTER — Other Ambulatory Visit: Payer: Self-pay | Admitting: *Deleted

## 2011-10-27 MED ORDER — TRAMADOL HCL 50 MG PO TABS: ORAL_TABLET | ORAL | Status: DC

## 2011-10-27 NOTE — Telephone Encounter (Signed)
Last 4/11 refill, pt of Dr.A, Last OV 02/2011

## 2011-11-07 DIAGNOSIS — M7061 Trochanteric bursitis, right hip: Secondary | ICD-10-CM | POA: Insufficient documentation

## 2011-11-09 ENCOUNTER — Other Ambulatory Visit: Payer: Self-pay | Admitting: *Deleted

## 2011-11-09 MED ORDER — HYDROCODONE-ACETAMINOPHEN 5-500 MG PO TABS: 1.0000 | ORAL_TABLET | ORAL | Status: DC | PRN

## 2011-11-09 MED ORDER — TRAMADOL HCL 50 MG PO TABS: ORAL_TABLET | ORAL | Status: DC

## 2011-11-09 NOTE — Telephone Encounter (Signed)
[facility]. 

## 2011-11-27 ENCOUNTER — Other Ambulatory Visit: Payer: Self-pay | Admitting: *Deleted

## 2011-11-27 ENCOUNTER — Other Ambulatory Visit: Payer: Self-pay | Admitting: Family Medicine

## 2011-11-27 MED ORDER — HYDROCODONE-ACETAMINOPHEN 5-500 MG PO TABS: 1.0000 | ORAL_TABLET | ORAL | Status: DC | PRN

## 2011-11-27 NOTE — Telephone Encounter (Signed)
Faxed request from [facility], last filled 11/09/11.

## 2011-11-27 NOTE — Telephone Encounter (Signed)
[facility]. 

## 2011-12-01 ENCOUNTER — Other Ambulatory Visit: Payer: Self-pay | Admitting: Family Medicine

## 2011-12-01 DIAGNOSIS — Z0000 Encounter for general adult medical examination without abnormal findings: Secondary | ICD-10-CM

## 2011-12-01 DIAGNOSIS — D649 Anemia, unspecified: Secondary | ICD-10-CM

## 2011-12-01 DIAGNOSIS — Z136 Encounter for screening for cardiovascular disorders: Secondary | ICD-10-CM

## 2011-12-01 DIAGNOSIS — E559 Vitamin D deficiency, unspecified: Secondary | ICD-10-CM

## 2011-12-07 ENCOUNTER — Other Ambulatory Visit (INDEPENDENT_AMBULATORY_CARE_PROVIDER_SITE_OTHER): Payer: BC Managed Care – PPO

## 2011-12-07 DIAGNOSIS — D649 Anemia, unspecified: Secondary | ICD-10-CM

## 2011-12-07 DIAGNOSIS — E559 Vitamin D deficiency, unspecified: Secondary | ICD-10-CM

## 2011-12-07 DIAGNOSIS — Z136 Encounter for screening for cardiovascular disorders: Secondary | ICD-10-CM

## 2011-12-07 DIAGNOSIS — Z0000 Encounter for general adult medical examination without abnormal findings: Secondary | ICD-10-CM

## 2011-12-07 LAB — CBC WITH DIFFERENTIAL/PLATELET
Basophils Relative: 0.6 % (ref 0.0–3.0)
Eosinophils Absolute: 0.1 10*3/uL (ref 0.0–0.7)
Hemoglobin: 11.8 g/dL — ABNORMAL LOW (ref 12.0–15.0)
Lymphocytes Relative: 30.9 % (ref 12.0–46.0)
MCHC: 32.2 g/dL (ref 30.0–36.0)
MCV: 89.9 fl (ref 78.0–100.0)
Monocytes Absolute: 0.4 10*3/uL (ref 0.1–1.0)
Neutro Abs: 2.6 10*3/uL (ref 1.4–7.7)
RBC: 4.08 Mil/uL (ref 3.87–5.11)

## 2011-12-07 LAB — COMPREHENSIVE METABOLIC PANEL
AST: 15 U/L (ref 0–37)
Alkaline Phosphatase: 58 U/L (ref 39–117)
BUN: 14 mg/dL (ref 6–23)
Creatinine, Ser: 0.6 mg/dL (ref 0.4–1.2)
Total Bilirubin: 0.3 mg/dL (ref 0.3–1.2)

## 2011-12-07 LAB — LIPID PANEL
HDL: 67.5 mg/dL (ref >39.00)
Total CHOL/HDL Ratio: 3

## 2011-12-14 ENCOUNTER — Ambulatory Visit (INDEPENDENT_AMBULATORY_CARE_PROVIDER_SITE_OTHER): Payer: BC Managed Care – PPO | Admitting: Family Medicine

## 2011-12-14 ENCOUNTER — Encounter: Payer: Self-pay | Admitting: Family Medicine

## 2011-12-14 VITALS — BP 120/80 | HR 52 | Temp 97.7°F | Ht 65.0 in | Wt 137.0 lb

## 2011-12-14 DIAGNOSIS — R5383 Other fatigue: Secondary | ICD-10-CM

## 2011-12-14 DIAGNOSIS — R5381 Other malaise: Secondary | ICD-10-CM

## 2011-12-14 DIAGNOSIS — G9050 Complex regional pain syndrome I, unspecified: Secondary | ICD-10-CM

## 2011-12-14 DIAGNOSIS — Z0000 Encounter for general adult medical examination without abnormal findings: Secondary | ICD-10-CM

## 2011-12-14 DIAGNOSIS — Z1211 Encounter for screening for malignant neoplasm of colon: Secondary | ICD-10-CM

## 2011-12-14 DIAGNOSIS — E559 Vitamin D deficiency, unspecified: Secondary | ICD-10-CM

## 2011-12-14 DIAGNOSIS — G8929 Other chronic pain: Secondary | ICD-10-CM

## 2011-12-14 LAB — T4, FREE: Free T4: 0.79 ng/dL (ref 0.60–1.60)

## 2011-12-14 MED ORDER — VITAMIN D3 250 MCG (10000 UT) PO CAPS: 10000.0000 [IU] | ORAL_CAPSULE | Freq: Every day | ORAL | Status: AC

## 2011-12-14 MED ORDER — HYDROCODONE-ACETAMINOPHEN 5-500 MG PO TABS: 1.0000 | ORAL_TABLET | ORAL | Status: DC | PRN

## 2011-12-14 NOTE — Patient Instructions (Addendum)
Good to see you. Hang in there. We are checking your thyroid function today. Please start taking a prenatal vitamin and vit d as sent to your pharmacy.

## 2011-12-14 NOTE — Progress Notes (Signed)
51 yo with complex medical history  here for CPX.  Unintentional weight loss- Wt Readings from Last 3 Encounters:  12/14/11 137 lb (62.143 kg)  03/13/11 152 lb 4 oz (69.06 kg)  10/27/10 155 lb (70.308 kg)   Continues to lose weight.  She is eating and no decreased appetite, just very stressed right now.  She has a lot going on, trying to get disability again. She denies any anxiety or depression- feels emotionally she is coping well.  Pain syndrome- very complex.  Has polydactyl and other associated abnormalities requiring multiple surgeries per year with specialist at [facility]. M Current pain regimen is working "ok."  Tries to stay active but in pain all the time. Take Vicodin 5-500 four times daily, Tramadol 100 mg every 6 hours, Flexeril and Tizanidine 2 mg two times a day as needed.  Also has a Lidoderm patch. A lot of her pain is neuropathic.  Could not tolerate Gapabentin.    LE edema- related to above issue.  Well controlled on HCTZ.  Well woman- due for colonoscopy.   She would prefer to have IFOB only first. UTD mammogram.  Patient Active Problem List  Diagnosis  . VENEREAL WART  . VITAMIN D DEFICIENCY  . OBESITY  . ANEMIA  . REFLEX SYMPATHETIC DYSTROPHY  . PAIN, CHRONIC NEC  . CARPAL TUNNEL SYNDROME  . PHARYNGITIS  . STRICTURE, ESOPHAGEAL  . UNSPECIFIED DISORDER OF KIDNEY AND URETER  . OSTEOARTHRITIS, SPINE  . POLYDACTYLY, HANDS  . DEPENDENT EDEMA  . MAMMOGRAM, ABNORMAL  . Inguinal hernia  . Routine general medical examination at a health care facility  . Femoral hernia   Past Medical History  Diagnosis Date  . GERD (gastroesophageal reflux disease)   . Pain syndrome, chronic     complex region   Past Surgical History  Procedure Date  . Carpal tunnel release 01/2006  . Rotator cuff repair 08/2000  . Thumb surgery     pollicized left  . Hand surgery     left   History  Substance Use Topics  . Smoking status: Never Smoker   . Smokeless tobacco: Not on  file  . Alcohol Use: Not on file   No family history on file. Allergies  Allergen Reactions  . Latex   . Clonidine Hydrochloride   . Codeine     REACTION: anaphylaxis  . Hydromorphone Hcl   . Morphine     REACTION: anaphylaxis  . Oxycodone-Acetaminophen     REACTION: anaphylaxis  . Prednisone     REACTION: difficulty breathing  . Sulfamethoxazole W-Trimethoprim     REACTION: rash  . Sulfonamide Derivatives     REACTION: urticaria (hives)   Current Outpatient Prescriptions on File Prior to Visit  Medication Sig Dispense Refill  . albuterol (PROVENTIL,VENTOLIN) 90 MCG/ACT inhaler Inhale 2 puffs into the lungs every 6 (six) hours as needed.        Marland Kitchen amitriptyline (ELAVIL) 10 MG tablet Take 1 tablet (10 mg total) by mouth at bedtime.  30 tablet  0  . cyclobenzaprine (FLEXERIL) 5 MG tablet Take 5 mg by mouth 3 (three) times daily as needed.        . hydrochlorothiazide (HYDRODIURIL) 25 MG tablet 1/2 to 1 once daily for swelling  30 tablet  11  . HYDROcodone-acetaminophen (VICODIN) 5-500 MG per tablet Take 1 tablet by mouth every 4 (four) hours as needed for pain.  100 tablet  0  . Lidocaine (LIDODERM EX) Apply thinnly to tender areas  on left shoulder 2-3 times daily       . lidocaine (LIDODERM) 5 % 1-2 patches on effected area, leave on for no longer than 12 hours       . meperidine (DEMEROL) 50 MG tablet Take one tablet by mouth every 4-6 hours as needed for pain       . Multiple Vitamin (MULTIVITAMINS PO) Take 1 tablet by mouth daily.        . naproxen sodium (ANAPROX) 220 MG tablet Take two tablets two times daily      . omeprazole (PRILOSEC) 40 MG capsule Take 40 mg by mouth daily.        Marland Kitchen tiZANidine (ZANAFLEX) 2 MG tablet Take 1 tablet (2 mg total) by mouth 2 (two) times daily.  60 tablet  3  . traMADol (ULTRAM) 50 MG tablet TAKE 1-2 TABLETS BY MOUTH EVERY 6 HOURS AS NEEDED FOR PAIN  60 tablet  1     The PMH, PSH, Social History, Family History, Medications, and allergies  have been reviewed in [facility], and have been updated if relevant.   Review of Systems       See HPI General:  Endorses fatigue Eyes:  Denies blurring. ENT:  Denies difficulty swallowing. CV:  Denies chest pain or discomfort. Resp:  Denies shortness of breath. GI:  Denies abdominal pain, bloody stools, and change in bowel habits. Derm:  Denies rash. Psych:  Denies anxiety and depression. Endo:  Denies cold intolerance and heat intolerance.  Physical Exam BP 120/80  Pulse 52  Temp 97.7 F (36.5 C)  Ht 5\' 5"  (1.651 m)  Wt 137 lb (62.143 kg)  BMI 22.80 kg/m2 Wt Readings from Last 3 Encounters:  12/14/11 137 lb (62.143 kg)  03/13/11 152 lb 4 oz (69.06 kg)  10/27/10 155 lb (70.308 kg)    General:  Well-developed,well-nourished,in no acute distress; alert,appropriate and cooperative throughout examination Head:  normocephalic and atraumatic.   Eyes:  vision grossly intact, pupils equal, pupils round, and pupils reactive to light, stabismus.   Ears:  R ear normal and L ear normal.   Nose:  no external deformity.   Mouth:  good dentition.   Neck:  No deformities, masses, or tenderness noted. Breasts:  No mass, nodules, thickening, tenderness, bulging, retraction, inflamation, nipple discharge or skin changes noted.   Lungs:  Normal respiratory effort, chest expands symmetrically. Lungs are clear to auscultation, no crackles or wheezes. Heart:  Normal rate and regular rhythm. S1 and S2 normal without gallop, murmur, click, rub or other extra sounds. Abdomen:  Bowel sounds positive,abdomen soft and non-tender without masses, organomegaly or hernias noted. Msk:  No deformity or scoliosis noted of thoracic or lumbar spine.    Extremities:  no edema eeither lower leg large varicose vein R thigh, not tender Polydactyly bilaterally Neurologic:  alert & oriented X3 and gait normal.   Skin:  Intact without suspicious lesions or rashes Cervical Nodes:  No lymphadenopathy noted Axillary Nodes:   No palpable lymphadenopathy Psych:  Cognition and judgment appear intact. Alert and cooperative with normal attention span and concentration. No apparent delusions, illusions, hallucinations  Assessment and Plan:  1. Routine general medical examination at a health care facility   Reviewed preventive care protocols, scheduled due services, and updated immunizations Discussed nutrition, exercise, diet, and healthy lifestyle.    2. REFLEX SYMPATHETIC DYSTROPHY    3. PAIN, CHRONIC NEC  Pleased with current pain meds   4. Fatigue  With weight loss- likely due  to increased stressors.  UTD on all prevention- will check IFOB today. Check TSH, FT4 Some mild anemia- start PNV. TSH, T4, Free  5. Screening for colon cancer  Fecal occult blood, imunochemical  6. VITAMIN D DEFICIENCY  Start Vitamin D 1000 IU daily.

## 2012-01-04 ENCOUNTER — Other Ambulatory Visit: Payer: Self-pay | Admitting: *Deleted

## 2012-01-04 MED ORDER — HYDROCODONE-ACETAMINOPHEN 5-500 MG PO TABS: 1.0000 | ORAL_TABLET | ORAL | Status: DC | PRN

## 2012-01-04 NOTE — Telephone Encounter (Signed)
Faxed refill request from [facility], last filled 180 on 12/14/11.

## 2012-01-04 NOTE — Telephone Encounter (Signed)
[facility]. 

## 2012-02-05 ENCOUNTER — Other Ambulatory Visit: Payer: Self-pay | Admitting: *Deleted

## 2012-02-05 MED ORDER — TIZANIDINE HCL 2 MG PO TABS: 2.0000 mg | ORAL_TABLET | Freq: Two times a day (BID) | ORAL | Status: DC

## 2012-02-05 MED ORDER — TRAMADOL HCL 50 MG PO TABS: ORAL_TABLET | ORAL | Status: DC

## 2012-02-05 NOTE — Telephone Encounter (Signed)
[facility]. 

## 2012-02-13 ENCOUNTER — Ambulatory Visit (INDEPENDENT_AMBULATORY_CARE_PROVIDER_SITE_OTHER): Payer: BC Managed Care – PPO | Admitting: Family Medicine

## 2012-02-13 ENCOUNTER — Encounter: Payer: Self-pay | Admitting: Family Medicine

## 2012-02-13 VITALS — BP 112/74 | HR 64 | Temp 97.9°F | Wt 144.0 lb

## 2012-02-13 DIAGNOSIS — L0291 Cutaneous abscess, unspecified: Secondary | ICD-10-CM

## 2012-02-13 MED ORDER — DOXYCYCLINE HYCLATE 100 MG PO TABS: 100.0000 mg | ORAL_TABLET | Freq: Two times a day (BID) | ORAL | Status: AC

## 2012-02-13 NOTE — Patient Instructions (Addendum)
Take doxycycline as directed- 1 tablet twice daily x 10 days. Please continue warm compresses and warm baths.  Call me immediately if symptoms are not improving or getting worse in the next couple of days.  If you spike a fever, call me immediately as well.

## 2012-02-13 NOTE — Progress Notes (Signed)
Very pleasant 51 yo female here for abscess on her right out labia majora for past week.  Her husband did have a MRSA boil recently.  It was large and very painful. Last two days, it has been draining, much smaller and less painful.  No fevers or chills.  No n/v/d.   Patient Active Problem List  Diagnosis  . VENEREAL WART  . VITAMIN D DEFICIENCY  . OBESITY  . ANEMIA  . REFLEX SYMPATHETIC DYSTROPHY  . PAIN, CHRONIC NEC  . CARPAL TUNNEL SYNDROME  . PHARYNGITIS  . STRICTURE, ESOPHAGEAL  . UNSPECIFIED DISORDER OF KIDNEY AND URETER  . OSTEOARTHRITIS, SPINE  . POLYDACTYLY, HANDS  . DEPENDENT EDEMA  . MAMMOGRAM, ABNORMAL  . Inguinal hernia  . Routine general medical examination at a health care facility  . Femoral hernia  . Fatigue   Past Medical History  Diagnosis Date  . GERD (gastroesophageal reflux disease)   . Pain syndrome, chronic     complex region   Past Surgical History  Procedure Date  . Carpal tunnel release 01/2006  . Rotator cuff repair 08/2000  . Thumb surgery     pollicized left  . Hand surgery     left   History  Substance Use Topics  . Smoking status: Never Smoker   . Smokeless tobacco: Not on file  . Alcohol Use: Not on file   No family history on file. Allergies  Allergen Reactions  . Latex   . Clonidine Hydrochloride   . Codeine     REACTION: anaphylaxis  . Hydromorphone Hcl   . Morphine     REACTION: anaphylaxis  . Oxycodone-Acetaminophen     REACTION: anaphylaxis  . Prednisone     REACTION: difficulty breathing  . Sulfamethoxazole W-Trimethoprim     REACTION: rash  . Sulfonamide Derivatives     REACTION: urticaria (hives)   Current Outpatient Prescriptions on File Prior to Visit  Medication Sig Dispense Refill  . albuterol (PROVENTIL,VENTOLIN) 90 MCG/ACT inhaler Inhale 2 puffs into the lungs every 6 (six) hours as needed.        Marland Kitchen amitriptyline (ELAVIL) 10 MG tablet Take 1 tablet (10 mg total) by mouth at bedtime.  30 tablet   0  . Cholecalciferol (VITAMIN D3) 10000 UNITS capsule Take 1 capsule (10,000 Units total) by mouth daily.  30 capsule  11  . cyclobenzaprine (FLEXERIL) 5 MG tablet Take 5 mg by mouth 3 (three) times daily as needed.        . hydrochlorothiazide (HYDRODIURIL) 25 MG tablet 1/2 to 1 once daily for swelling  30 tablet  11  . HYDROcodone-acetaminophen (VICODIN) 5-500 MG per tablet Take 1 tablet by mouth every 4 (four) hours as needed for pain.  180 tablet  0  . Lidocaine (LIDODERM EX) Apply thinnly to tender areas on left shoulder 2-3 times daily       . lidocaine (LIDODERM) 5 % 1-2 patches on effected area, leave on for no longer than 12 hours       . Multiple Vitamin (MULTIVITAMINS PO) Take 1 tablet by mouth daily.        . naproxen sodium (ANAPROX) 220 MG tablet Take two tablets two times daily      . omeprazole (PRILOSEC) 40 MG capsule Take 40 mg by mouth daily.        Marland Kitchen tiZANidine (ZANAFLEX) 2 MG tablet Take 1 tablet (2 mg total) by mouth 2 (two) times daily.  60 tablet  3  .  traMADol (ULTRAM) 50 MG tablet TAKE 1-2 TABLETS BY MOUTH EVERY 6 HOURS AS NEEDED FOR PAIN  60 tablet  1     The PMH, PSH, Social History, Family History, Medications, and allergies have been reviewed in [facility], and have been updated if relevant.   Review of Systems       See HPI  Physical Exam BP 112/74  Pulse 64  Temp 97.9 F (36.6 C)  Wt 144 lb (65.318 kg)  General:  Well-developed,well-nourished,in no acute distress; alert,appropriate and cooperative throughout examination  Extremities:  no edema eeither lower leg large varicose vein R thigh, not tender Polydactyly bilaterally Neurologic:  alert & oriented X3 and gait normal.   Skin:   Firm, non fluctuant 1 cm abscess on outer right labia- not actively draining but there is a hole where it appears it was draining. Psych:  Cognition and judgment appear intact. Alert and cooperative with normal attention span and concentration. No apparent delusions, illusions,  hallucinations  Assessment and Plan:  1.  Abscess-  New- non fluctuant and given location, drainage in our office would not be ideal. Since she reports it is much smaller and less painful and no erythema or fevers, will try doxycyline (has sulfa allergy) to cover MRSA and warm compresses. If does not resolve or deteriorate in next day or two, refer to surgery for drainage. The patient indicates understanding of these issues and agrees with the plan.

## 2012-02-16 ENCOUNTER — Other Ambulatory Visit: Payer: Self-pay | Admitting: Family Medicine

## 2012-02-16 NOTE — Telephone Encounter (Signed)
Received refill request from [facility]  request refill for HYDROCODONE-APAP 5-500mg  . Please advise Thanks

## 2012-02-19 MED ORDER — HYDROCODONE-ACETAMINOPHEN 5-500 MG PO TABS: 1.0000 | ORAL_TABLET | ORAL | Status: DC | PRN

## 2012-02-19 NOTE — Telephone Encounter (Signed)
Please call in

## 2012-02-20 NOTE — Telephone Encounter (Signed)
Rx called to pharmacy as instructed. 

## 2012-03-14 ENCOUNTER — Other Ambulatory Visit: Payer: Self-pay | Admitting: *Deleted

## 2012-03-14 MED ORDER — TRAMADOL HCL 50 MG PO TABS: ORAL_TABLET | ORAL | Status: DC

## 2012-03-14 MED ORDER — HYDROCHLOROTHIAZIDE 25 MG PO TABS: ORAL_TABLET | ORAL | Status: DC

## 2012-03-14 NOTE — Telephone Encounter (Signed)
Faxed refill request from [facility], last filled 02/24/12.

## 2012-03-19 ENCOUNTER — Other Ambulatory Visit: Payer: Self-pay | Admitting: *Deleted

## 2012-03-19 MED ORDER — HYDROCODONE-ACETAMINOPHEN 5-500 MG PO TABS: 1.0000 | ORAL_TABLET | ORAL | Status: DC | PRN

## 2012-03-19 NOTE — Telephone Encounter (Signed)
Faxed refill request from [facility], last filled 02/20/12.

## 2012-03-19 NOTE — Telephone Encounter (Signed)
[facility]. 

## 2012-04-09 DIAGNOSIS — G5621 Lesion of ulnar nerve, right upper limb: Secondary | ICD-10-CM | POA: Insufficient documentation

## 2012-04-10 ENCOUNTER — Other Ambulatory Visit: Payer: Self-pay

## 2012-04-10 MED ORDER — HYDROCODONE-ACETAMINOPHEN 5-500 MG PO TABS: 1.0000 | ORAL_TABLET | ORAL | Status: DC | PRN

## 2012-04-10 MED ORDER — TRAMADOL HCL 50 MG PO TABS: ORAL_TABLET | ORAL | Status: DC

## 2012-04-10 NOTE — Telephone Encounter (Addendum)
[facility] faxed refill hydrocodone apap and tramadol. Last filled hydrocodone apap on 03/19/12 and tramadol on 03/14/12.Please advise.

## 2012-04-10 NOTE — Telephone Encounter (Signed)
Hydrocodone acetaminophen 5-500 mg #180 called in to [facility] pharmacy.

## 2012-05-20 ENCOUNTER — Other Ambulatory Visit: Payer: Self-pay | Admitting: Family Medicine

## 2012-05-20 DIAGNOSIS — Z1231 Encounter for screening mammogram for malignant neoplasm of breast: Secondary | ICD-10-CM

## 2012-06-02 DIAGNOSIS — M7070 Other bursitis of hip, unspecified hip: Secondary | ICD-10-CM | POA: Insufficient documentation

## 2012-06-03 ENCOUNTER — Other Ambulatory Visit: Payer: Self-pay | Admitting: *Deleted

## 2012-06-03 MED ORDER — HYDROCODONE-ACETAMINOPHEN 5-500 MG PO TABS: 1.0000 | ORAL_TABLET | ORAL | Status: DC | PRN

## 2012-06-03 NOTE — Telephone Encounter (Signed)
vicodin called to [facility].

## 2012-06-05 ENCOUNTER — Other Ambulatory Visit: Payer: Self-pay | Admitting: *Deleted

## 2012-06-05 MED ORDER — TIZANIDINE HCL 2 MG PO TABS: 2.0000 mg | ORAL_TABLET | Freq: Two times a day (BID) | ORAL | Status: DC

## 2012-06-05 NOTE — Telephone Encounter (Signed)
rx called to pharmacy 

## 2012-06-06 ENCOUNTER — Other Ambulatory Visit: Payer: Self-pay | Admitting: *Deleted

## 2012-06-06 MED ORDER — TRAMADOL HCL 50 MG PO TABS: ORAL_TABLET | ORAL | Status: DC

## 2012-06-26 ENCOUNTER — Other Ambulatory Visit: Payer: Self-pay | Admitting: *Deleted

## 2012-06-26 MED ORDER — TRAMADOL HCL 50 MG PO TABS: ORAL_TABLET | ORAL | Status: DC

## 2012-07-01 ENCOUNTER — Ambulatory Visit
Admission: RE | Admit: 2012-07-01 | Discharge: 2012-07-01 | Disposition: A | Discharge status: Home / Self Care | Payer: BC Managed Care – PPO | Source: Ambulatory Visit | Attending: Family Medicine | Admitting: Family Medicine

## 2012-07-01 DIAGNOSIS — Z1231 Encounter for screening mammogram for malignant neoplasm of breast: Secondary | ICD-10-CM

## 2012-07-16 ENCOUNTER — Other Ambulatory Visit: Payer: Self-pay | Admitting: *Deleted

## 2012-07-16 MED ORDER — TRAMADOL HCL 50 MG PO TABS: ORAL_TABLET | ORAL | Status: DC

## 2012-07-16 MED ORDER — HYDROCODONE-ACETAMINOPHEN 5-500 MG PO TABS: 1.0000 | ORAL_TABLET | ORAL | Status: DC | PRN

## 2012-07-16 NOTE — Telephone Encounter (Signed)
Pt calls stating pharmacy had faxed request over a week ago, but had not received a response on pt's vicodin and Tramadol rx. Uses [facility]

## 2012-07-16 NOTE — Telephone Encounter (Signed)
Medicines called to [facility]. 

## 2012-08-06 ENCOUNTER — Other Ambulatory Visit: Payer: Self-pay | Admitting: Family Medicine

## 2012-08-06 MED ORDER — TRAMADOL HCL 50 MG PO TABS: ORAL_TABLET | ORAL | Status: DC

## 2012-08-06 NOTE — Telephone Encounter (Signed)
Faxed refill request from [facility].  Last filled 07/16/12.

## 2012-08-06 NOTE — Telephone Encounter (Signed)
Phoned in to [facility]. 

## 2012-08-12 ENCOUNTER — Other Ambulatory Visit: Payer: Self-pay | Admitting: *Deleted

## 2012-08-12 MED ORDER — HYDROCODONE-ACETAMINOPHEN 5-500 MG PO TABS: 1.0000 | ORAL_TABLET | ORAL | Status: DC | PRN

## 2012-08-12 NOTE — Telephone Encounter (Signed)
[facility]. 

## 2012-08-12 NOTE — Telephone Encounter (Signed)
Last filled 05/26/13

## 2012-09-02 ENCOUNTER — Other Ambulatory Visit: Payer: Self-pay | Admitting: *Deleted

## 2012-09-02 MED ORDER — TRAMADOL HCL 50 MG PO TABS: ORAL_TABLET | ORAL | Status: DC

## 2012-09-02 NOTE — Telephone Encounter (Signed)
Called to [facility]. 

## 2012-09-02 NOTE — Telephone Encounter (Signed)
Faxed refill request from [facility], last filled 08/06/12.

## 2012-09-12 ENCOUNTER — Other Ambulatory Visit: Payer: Self-pay

## 2012-09-12 MED ORDER — HYDROCODONE-ACETAMINOPHEN 5-500 MG PO TABS: 1.0000 | ORAL_TABLET | ORAL | Status: DC | PRN

## 2012-09-12 MED ORDER — TRAMADOL HCL 50 MG PO TABS: ORAL_TABLET | ORAL | Status: DC

## 2012-09-12 NOTE — Telephone Encounter (Signed)
Medicines called to [facility]. 

## 2012-09-12 NOTE — Telephone Encounter (Signed)
[facility] faxed refill request Hydrocodone-apap 5-500 mg last filled 08/12/12 and tramadol last filled 09/02/12.Please advise.

## 2012-10-01 ENCOUNTER — Other Ambulatory Visit: Payer: Self-pay | Admitting: *Deleted

## 2012-10-01 MED ORDER — TIZANIDINE HCL 2 MG PO TABS: 2.0000 mg | ORAL_TABLET | Freq: Two times a day (BID) | ORAL | Status: DC

## 2012-10-01 MED ORDER — TRAMADOL HCL 50 MG PO TABS: ORAL_TABLET | ORAL | Status: DC

## 2012-10-01 NOTE — Telephone Encounter (Signed)
Tramadol last filled 09/12/12 zanaflex last filled 08/30/12

## 2012-10-11 ENCOUNTER — Other Ambulatory Visit: Payer: Self-pay | Admitting: *Deleted

## 2012-10-11 MED ORDER — HYDROCODONE-ACETAMINOPHEN 5-325 MG PO TABS: 1.0000 | ORAL_TABLET | ORAL | Status: DC | PRN

## 2012-10-11 NOTE — Telephone Encounter (Signed)
Called to [facility]. 

## 2012-10-11 NOTE — Telephone Encounter (Signed)
Received refill request for vicodin 5/500 was last filled on 09/12/12. 5/500 strength has been discontinued by manufacturer  (everything with tyl over 325 mg has been d/c)  and 5/325 is the next comparable dose. I already changed rx to 5/325 using the same dosing instructions.

## 2012-10-23 ENCOUNTER — Other Ambulatory Visit: Payer: Self-pay | Admitting: *Deleted

## 2012-10-23 MED ORDER — HYDROCODONE-ACETAMINOPHEN 5-325 MG PO TABS: 1.0000 | ORAL_TABLET | ORAL | Status: DC | PRN

## 2012-10-23 MED ORDER — TRAMADOL HCL 50 MG PO TABS: ORAL_TABLET | ORAL | Status: DC

## 2012-10-23 NOTE — Telephone Encounter (Signed)
Received faxed refill request.Last office visit 02/13/12. Is it okay to refill?

## 2012-10-23 NOTE — Telephone Encounter (Signed)
Pharmacy notified as instructed by telephone. 

## 2012-10-23 NOTE — Telephone Encounter (Signed)
Called refill to [facility] and was advised that patient has been getting #180 and received this amount on 09/12/12. Pharmacist wants to make sure that patient is to get #30?  Please advise.

## 2012-10-23 NOTE — Telephone Encounter (Signed)
Noted. Please change quantity to 180.

## 2012-11-15 ENCOUNTER — Other Ambulatory Visit: Payer: Self-pay | Admitting: Family Medicine

## 2012-11-15 NOTE — Telephone Encounter (Signed)
Ok to refill 

## 2012-11-15 NOTE — Telephone Encounter (Signed)
Norco called to [facility]. 

## 2012-11-28 ENCOUNTER — Other Ambulatory Visit: Payer: Self-pay | Admitting: Family Medicine

## 2012-11-28 NOTE — Telephone Encounter (Signed)
Pt was given # 60 tramadol, with one additional refill, on 11/15/12.  [facility] said to disregard this request, request popped up on their automatic refill system, and pt may not need yet.

## 2012-11-30 ENCOUNTER — Other Ambulatory Visit: Payer: Self-pay | Admitting: Family Medicine

## 2012-12-12 ENCOUNTER — Telehealth: Payer: Self-pay

## 2012-12-12 NOTE — Telephone Encounter (Signed)
Pt does not need med refill now and scheduled first available CPX on 02/27/13; pt wants to verify how to get refills until CPX. Advised pt when need refill contact pharmacy and they will contact office about refills.

## 2012-12-16 ENCOUNTER — Other Ambulatory Visit: Payer: Self-pay | Admitting: Family Medicine

## 2012-12-16 NOTE — Telephone Encounter (Signed)
Refill on norco called to [facility].

## 2013-01-01 ENCOUNTER — Other Ambulatory Visit: Payer: Self-pay | Admitting: Family Medicine

## 2013-01-01 NOTE — Telephone Encounter (Signed)
Needs office visit before further refills

## 2013-01-06 ENCOUNTER — Other Ambulatory Visit: Payer: Self-pay | Admitting: Family Medicine

## 2013-01-06 NOTE — Telephone Encounter (Signed)
norco refill called to [facility]

## 2013-02-03 ENCOUNTER — Other Ambulatory Visit: Payer: Self-pay | Admitting: *Deleted

## 2013-02-03 MED ORDER — TIZANIDINE HCL 2 MG PO TABS: 2.0000 mg | ORAL_TABLET | Freq: Two times a day (BID) | ORAL | Status: DC

## 2013-02-11 ENCOUNTER — Other Ambulatory Visit: Payer: Self-pay | Admitting: Family Medicine

## 2013-02-11 DIAGNOSIS — Z136 Encounter for screening for cardiovascular disorders: Secondary | ICD-10-CM

## 2013-02-11 DIAGNOSIS — Z0000 Encounter for general adult medical examination without abnormal findings: Secondary | ICD-10-CM

## 2013-02-11 DIAGNOSIS — E559 Vitamin D deficiency, unspecified: Secondary | ICD-10-CM

## 2013-02-11 DIAGNOSIS — D649 Anemia, unspecified: Secondary | ICD-10-CM

## 2013-02-12 ENCOUNTER — Other Ambulatory Visit: Payer: Self-pay | Admitting: *Deleted

## 2013-02-12 MED ORDER — HYDROCODONE-ACETAMINOPHEN 5-325 MG PO TABS: ORAL_TABLET | ORAL | Status: DC

## 2013-02-12 MED ORDER — TRAMADOL HCL 50 MG PO TABS: ORAL_TABLET | ORAL | Status: DC

## 2013-02-12 NOTE — Telephone Encounter (Signed)
Last filled 01/06/13

## 2013-02-12 NOTE — Telephone Encounter (Signed)
rx called into pharmacy

## 2013-02-19 ENCOUNTER — Encounter: Payer: Self-pay | Admitting: Radiology

## 2013-02-20 ENCOUNTER — Other Ambulatory Visit: Payer: BC Managed Care – PPO

## 2013-02-24 ENCOUNTER — Encounter: Payer: Self-pay | Admitting: Radiology

## 2013-02-25 ENCOUNTER — Other Ambulatory Visit (INDEPENDENT_AMBULATORY_CARE_PROVIDER_SITE_OTHER): Payer: BC Managed Care – PPO

## 2013-02-25 DIAGNOSIS — D649 Anemia, unspecified: Secondary | ICD-10-CM

## 2013-02-25 DIAGNOSIS — E559 Vitamin D deficiency, unspecified: Secondary | ICD-10-CM

## 2013-02-25 DIAGNOSIS — Z0000 Encounter for general adult medical examination without abnormal findings: Secondary | ICD-10-CM

## 2013-02-25 DIAGNOSIS — Z136 Encounter for screening for cardiovascular disorders: Secondary | ICD-10-CM

## 2013-02-25 LAB — CBC WITH DIFFERENTIAL/PLATELET
Basophils Relative: 0.5 % (ref 0.0–3.0)
Eosinophils Relative: 1.7 % (ref 0.0–5.0)
Lymphocytes Relative: 15.7 % (ref 12.0–46.0)
Monocytes Absolute: 0.6 10*3/uL (ref 0.1–1.0)
Monocytes Relative: 6.4 % (ref 3.0–12.0)
Neutrophils Relative %: 75.7 % (ref 43.0–77.0)
Platelets: 289 10*3/uL (ref 150.0–400.0)
RBC: 4.4 Mil/uL (ref 3.87–5.11)
WBC: 9.2 10*3/uL (ref 4.5–10.5)

## 2013-02-25 LAB — LIPID PANEL
Cholesterol: 220 mg/dL — ABNORMAL HIGH (ref 0–200)
Total CHOL/HDL Ratio: 3
Triglycerides: 86 mg/dL (ref 0.0–149.0)

## 2013-02-25 LAB — COMPREHENSIVE METABOLIC PANEL
AST: 12 U/L (ref 0–37)
Albumin: 4.4 g/dL (ref 3.5–5.2)
Alkaline Phosphatase: 67 U/L (ref 39–117)
BUN: 14 mg/dL (ref 6–23)
Calcium: 9.7 mg/dL (ref 8.4–10.5)
Chloride: 100 mEq/L (ref 96–112)
Glucose, Bld: 84 mg/dL (ref 70–99)
Potassium: 3.6 mEq/L (ref 3.5–5.1)
Sodium: 137 mEq/L (ref 135–145)
Total Protein: 7.8 g/dL (ref 6.0–8.3)

## 2013-02-26 LAB — VITAMIN D 25 HYDROXY (VIT D DEFICIENCY, FRACTURES): Vit D, 25-Hydroxy: 46 ng/mL (ref 30–89)

## 2013-02-27 ENCOUNTER — Ambulatory Visit (INDEPENDENT_AMBULATORY_CARE_PROVIDER_SITE_OTHER): Payer: BC Managed Care – PPO | Admitting: Family Medicine

## 2013-02-27 ENCOUNTER — Encounter: Payer: Self-pay | Admitting: Family Medicine

## 2013-02-27 VITALS — BP 102/70 | HR 66 | Temp 98.1°F | Ht 64.75 in | Wt 148.0 lb

## 2013-02-27 DIAGNOSIS — Z0000 Encounter for general adult medical examination without abnormal findings: Secondary | ICD-10-CM

## 2013-02-27 DIAGNOSIS — G9050 Complex regional pain syndrome I, unspecified: Secondary | ICD-10-CM

## 2013-02-27 DIAGNOSIS — G8929 Other chronic pain: Secondary | ICD-10-CM

## 2013-02-27 DIAGNOSIS — Q690 Accessory finger(s): Secondary | ICD-10-CM

## 2013-02-27 NOTE — Progress Notes (Signed)
52 yo pleasant female with complex medical history  here for CPX.  Has had a stressful year.  She has been caring for her 3 step grandsons.  The youngest is 3 1/2 and just diagnosed with leukemia.  He is receiving treatment but doing well. She feels she is handling this ok. She has her disability hearing next month.  Pain syndrome- very complex.  Has polydactyl and other associated abnormalities requiring multiple surgeries per year with specialist at [facility]. She is due for another ulnar surgery but has put it off due to her grand son's leukemia treatments.Current pain regimen is working "ok."  Tries to stay active but in pain all the time. Take Vicodin 5-500 four times daily, Tramadol 100 mg every 6 hours, Flexeril and Tizanidine 2 mg two times a day as needed.  Also has a Lidoderm patch. A lot of her pain is neuropathic.  Could not tolerate Gapabentin.    LE edema- related to above issue.  Well controlled on HCTZ.  Well woman- due for colonoscopy but refusing.   Refusing IFOB as well.  Denies any changes in her bowel habits or blood in her stool.  UTD mammogram.  UTD pap smear- last done by me in 10/2010.  No h/o abnormal pap smears.  Lab Results  Component Value Date   CHOL 220* 02/25/2013   HDL 69.70 02/25/2013   LDLCALC 91 12/07/2011   LDLDIRECT 138.8 02/25/2013   TRIG 86.0 02/25/2013   CHOLHDL 3 02/25/2013    Lab Results  Component Value Date   NA 137 02/25/2013   K 3.6 02/25/2013   CL 100 02/25/2013   CO2 29 02/25/2013     Patient Active Problem List   Diagnosis Date Noted  . Routine general medical examination at a health care facility 10/27/2010  . UNSPECIFIED DISORDER OF KIDNEY AND URETER 03/21/2010  . MAMMOGRAM, ABNORMAL 05/19/2008  . VITAMIN D DEFICIENCY 12/25/2007  . ANEMIA 12/25/2007  . REFLEX SYMPATHETIC DYSTROPHY 04/15/2007  . OSTEOARTHRITIS, SPINE 04/15/2007  . VENEREAL WART 04/09/2007  . PAIN, CHRONIC NEC 04/09/2007  . CARPAL TUNNEL SYNDROME 04/09/2007  . STRICTURE,  ESOPHAGEAL 04/09/2007  . POLYDACTYLY, HANDS 04/09/2007   Past Medical History  Diagnosis Date  . GERD (gastroesophageal reflux disease)   . Pain syndrome, chronic     complex region   Past Surgical History  Procedure Laterality Date  . Carpal tunnel release  01/2006  . Rotator cuff repair  08/2000  . Thumb surgery      pollicized left  . Hand surgery      left   History  Substance Use Topics  . Smoking status: Never Smoker   . Smokeless tobacco: Not on file  . Alcohol Use: Not on file   No family history on file. Allergies  Allergen Reactions  . Latex   . Clonidine Hydrochloride   . Codeine     REACTION: anaphylaxis  . Hydromorphone Hcl   . Morphine     REACTION: anaphylaxis  . Oxycodone-Acetaminophen     REACTION: anaphylaxis  . Prednisone     REACTION: difficulty breathing  . Sulfamethoxazole W-Trimethoprim     REACTION: rash  . Sulfonamide Derivatives     REACTION: urticaria (hives)   Current Outpatient Prescriptions on File Prior to Visit  Medication Sig Dispense Refill  . albuterol (PROVENTIL,VENTOLIN) 90 MCG/ACT inhaler Inhale 2 puffs into the lungs every 6 (six) hours as needed.        Marland Kitchen amitriptyline (ELAVIL) 10 MG tablet  Take 1 tablet (10 mg total) by mouth at bedtime.  30 tablet  0  . cyclobenzaprine (FLEXERIL) 5 MG tablet Take 5 mg by mouth 3 (three) times daily as needed.        . hydrochlorothiazide (HYDRODIURIL) 25 MG tablet TAKE 1/2 TO 1 TABLET BY MOUTH EVERY DAY FOR SWELLING  30 tablet  1  . HYDROcodone-acetaminophen (NORCO/VICODIN) 5-325 MG per tablet TAKE 1 TO 2 TABLETS BY MOUTH EVERY SIX HOURS AS NEEDED FOR PAIN  180 tablet  0  . Lidocaine (LIDODERM EX) Apply thinnly to tender areas on left shoulder 2-3 times daily       . lidocaine (LIDODERM) 5 % 1-2 patches on effected area, leave on for no longer than 12 hours       . Multiple Vitamin (MULTIVITAMINS PO) Take 1 tablet by mouth daily.        . naproxen sodium (ANAPROX) 220 MG tablet Take two  tablets two times daily      . omeprazole (PRILOSEC) 40 MG capsule Take 40 mg by mouth daily.        Marland Kitchen tiZANidine (ZANAFLEX) 2 MG tablet Take 1 tablet (2 mg total) by mouth 2 (two) times daily.  60 tablet  0  . traMADol (ULTRAM) 50 MG tablet TAKE ONE TO TWO TABLETS EVERY SIX HOURS AS NEEDED FOR PAIN  60 tablet  0   No current facility-administered medications on file prior to visit.     The PMH, PSH, Social History, Family History, Medications, and allergies have been reviewed in [facility], and have been updated if relevant.   Review of Systems       See HPI  Denies anxiety or depression No breast lumps or changes  Denies abnormal vaginal discharge or dysuria .  Physical Exam There were no vitals taken for this visit. Wt Readings from Last 3 Encounters:  02/13/12 144 lb (65.318 kg)  12/14/11 137 lb (62.143 kg)  03/13/11 152 lb 4 oz (69.06 kg)    General:  Well-developed,well-nourished,in no acute distress; alert,appropriate and cooperative throughout examination Head:  normocephalic and atraumatic.   Eyes:  vision grossly intact, pupils equal, pupils round, and pupils reactive to light, stabismus.   Ears:  R ear normal and L ear normal.   Nose:  no external deformity.   Mouth:  good dentition.   Neck:  No deformities, masses, or tenderness noted. Breasts:  No mass, nodules, thickening, tenderness, bulging, retraction, inflamation, nipple discharge or skin changes noted.   Lungs:  Normal respiratory effort, chest expands symmetrically. Lungs are clear to auscultation, no crackles or wheezes. Heart:  Normal rate and regular rhythm. S1 and S2 normal without gallop, murmur, click, rub or other extra sounds. Abdomen:  Bowel sounds positive,abdomen soft and non-tender without masses, organomegaly or hernias noted. Msk:  No deformity or scoliosis noted of thoracic or lumbar spine.    Extremities:  Thick ankles but no edema Polydactyly bilaterally Neurologic:  alert & oriented X3 and gait  normal.   Skin:  Intact without suspicious lesions or rashes Cervical Nodes:  No lymphadenopathy noted Axillary Nodes:  No palpable lymphadenopathy Psych:  Cognition and judgment appear intact. Alert and cooperative with normal attention span and concentration. No apparent delusions, illusions, hallucinations  Assessment and Plan: 1. Routine general medical examination at a health care facility Reviewed preventive care protocols, scheduled due services, and updated immunizations Discussed nutrition, exercise, diet, and healthy lifestyle. Declines flu shot. 2. POLYDACTYLY, HANDS Stable- has another disability hearing.  3. REFLEX SYMPATHETIC DYSTROPHY Stable on current meds- awaiting surgery.  4. PAIN, CHRONIC NEC On Norco.

## 2013-02-27 NOTE — Patient Instructions (Addendum)
It was great to see you. Hang in there- I wish Casimiro Needle a speedy continued recovery.  Your blood work is excellent!

## 2013-03-07 ENCOUNTER — Other Ambulatory Visit: Payer: Self-pay | Admitting: *Deleted

## 2013-03-07 MED ORDER — HYDROCHLOROTHIAZIDE 25 MG PO TABS: ORAL_TABLET | ORAL | Status: DC

## 2013-03-11 ENCOUNTER — Other Ambulatory Visit: Payer: Self-pay | Admitting: *Deleted

## 2013-03-11 MED ORDER — TRAMADOL HCL 50 MG PO TABS: ORAL_TABLET | ORAL | Status: DC

## 2013-03-11 MED ORDER — HYDROCODONE-ACETAMINOPHEN 5-325 MG PO TABS: ORAL_TABLET | ORAL | Status: DC

## 2013-03-11 NOTE — Telephone Encounter (Signed)
rxs called to pharmacy

## 2013-03-11 NOTE — Telephone Encounter (Signed)
Both last filled 02/12/13

## 2013-03-17 ENCOUNTER — Encounter: Payer: Self-pay | Admitting: Family Medicine

## 2013-04-02 ENCOUNTER — Other Ambulatory Visit: Payer: Self-pay | Admitting: *Deleted

## 2013-04-02 MED ORDER — TRAMADOL HCL 50 MG PO TABS: ORAL_TABLET | ORAL | Status: DC

## 2013-04-02 NOTE — Telephone Encounter (Signed)
Last filled 03/11/2013

## 2013-04-03 NOTE — Telephone Encounter (Signed)
rxs called to pharmacy

## 2013-04-08 ENCOUNTER — Other Ambulatory Visit: Payer: Self-pay

## 2013-04-08 MED ORDER — HYDROCODONE-ACETAMINOPHEN 5-325 MG PO TABS: ORAL_TABLET | ORAL | Status: DC

## 2013-04-08 NOTE — Telephone Encounter (Addendum)
Last office visit 02/27/13. Is it okay to refill medication?

## 2013-04-08 NOTE — Telephone Encounter (Signed)
Pt request rx Hydrocodone apap. Call when ready for pick up. Pt will be out of med 04/09/13.

## 2013-04-08 NOTE — Telephone Encounter (Signed)
Spoke with patient and advised rx ready for pick-up and it will be at the front desk.  

## 2013-04-21 ENCOUNTER — Other Ambulatory Visit: Payer: Self-pay | Admitting: *Deleted

## 2013-04-21 NOTE — Telephone Encounter (Signed)
Faxed refill request. Please advise.  

## 2013-04-28 ENCOUNTER — Other Ambulatory Visit: Payer: Self-pay | Admitting: *Deleted

## 2013-04-28 MED ORDER — TRAMADOL HCL 50 MG PO TABS: ORAL_TABLET | ORAL | Status: DC

## 2013-04-28 NOTE — Telephone Encounter (Signed)
Last office visit 02/27/2013.  Ok to refill? 

## 2013-04-28 NOTE — Telephone Encounter (Signed)
Ok to refill, when you call it in, tell them to fill on or after 05/03/2013

## 2013-04-28 NOTE — Telephone Encounter (Signed)
Called to [facility].  Pharmacist notified not to fill until 05/03/2013.

## 2013-04-29 ENCOUNTER — Telehealth: Payer: Self-pay | Admitting: *Deleted

## 2013-04-29 NOTE — Telephone Encounter (Signed)
Mrs. Boulay calling asking why she is having trouble getting her Tramadol filled.  Patient states she is taking 1 to 2 tablets every 6 hours.  Usually sixty is called in and that does not last her a full month.  Asking for a refill with a quantity that will last for a full month.  Also asking if we could go ahead and get her Vicodin prescription ready.  She knows it is not due until 05/10/2013 but due to traveling over the holidays would like to go ahead a pick up prescription.  States doctor can write on there not to fill until 05/10/2013.  Will forward to [facility] for review.

## 2013-04-30 MED ORDER — HYDROCODONE-ACETAMINOPHEN 5-325 MG PO TABS: ORAL_TABLET | ORAL | Status: DC

## 2013-04-30 MED ORDER — TRAMADOL HCL 50 MG PO TABS: ORAL_TABLET | ORAL | Status: DC

## 2013-04-30 NOTE — Telephone Encounter (Signed)
Tramadol called to [facility].  Mrs. Birchard notified Vicodin prescription is ready to be picked up at front desk.  Do not fill until 05/10/2013.

## 2013-04-30 NOTE — Telephone Encounter (Signed)
Give tramadol Quantity # 90, ok to refill Vicodin with comment not to fill until 05/10/13

## 2013-05-02 ENCOUNTER — Encounter: Payer: Self-pay | Admitting: Family Medicine

## 2013-06-03 ENCOUNTER — Other Ambulatory Visit: Payer: Self-pay | Admitting: Internal Medicine

## 2013-06-03 ENCOUNTER — Other Ambulatory Visit: Payer: Self-pay | Admitting: Family Medicine

## 2013-06-03 NOTE — Telephone Encounter (Signed)
She not due until the 12/22

## 2013-06-03 NOTE — Telephone Encounter (Signed)
Last filled by Dr. Patsy Lager on 04/29/13 and before that, was filled by Dr. Alphonsus Sias on 04/08/13 and on 03/11/13 by Dr. Dayton Martes.  Nicki Reaper, NP does not agree to fill this since pt has been getting RX filled early.  Please advise.

## 2013-06-03 NOTE — Telephone Encounter (Signed)
Last filled 04/28/13 

## 2013-06-03 NOTE — Telephone Encounter (Signed)
Called pharmacy to inform that it is too early for a refill

## 2013-06-03 NOTE — Telephone Encounter (Signed)
I have never met this patient. My partner is on maternity leave. Last filled 04/29/2013. Today is 06/03/2013. I will cover my partner until her return for maternity leave.  I will fill in AM, then let her PCP resume chronic narcotic management.

## 2013-06-03 NOTE — Telephone Encounter (Signed)
Last filled 04/29/13

## 2013-06-04 ENCOUNTER — Other Ambulatory Visit: Payer: Self-pay | Admitting: *Deleted

## 2013-06-04 MED ORDER — TRAMADOL HCL 50 MG PO TABS: ORAL_TABLET | ORAL | Status: DC

## 2013-06-04 MED ORDER — HYDROCODONE-ACETAMINOPHEN 5-325 MG PO TABS: ORAL_TABLET | ORAL | Status: DC

## 2013-06-04 NOTE — Telephone Encounter (Signed)
Left message for Jessica Shelton that prescription is ready to be picked up at front desk.

## 2013-06-04 NOTE — Addendum Note (Signed)
Addended by: Hannah Beat on: 06/04/2013 08:16 AM   Modules accepted: Orders

## 2013-06-04 NOTE — Telephone Encounter (Signed)
Pharmacy faxed refill request for tramadol. Med was last filled 05/10/13 for #90. Pharmacy states this was an 11 day supply and pt request refill.

## 2013-06-06 NOTE — Telephone Encounter (Signed)
Pt came to ck on refill of tramadol.Medication phoned to [facility] pharmacy as instructed.

## 2013-06-23 ENCOUNTER — Other Ambulatory Visit: Payer: Self-pay | Admitting: Internal Medicine

## 2013-06-24 NOTE — Telephone Encounter (Signed)
Rx called into pharmacy Upon approval per Dr Dayton MartesAron

## 2013-06-24 NOTE — Telephone Encounter (Signed)
Last filled 06/04/13 #11 day supply per pharmacy--please advise if okay to call in

## 2013-06-30 ENCOUNTER — Other Ambulatory Visit: Payer: Self-pay | Admitting: Family Medicine

## 2013-06-30 DIAGNOSIS — M960 Pseudarthrosis after fusion or arthrodesis: Secondary | ICD-10-CM | POA: Insufficient documentation

## 2013-06-30 NOTE — Telephone Encounter (Signed)
Last office visit 02/27/13. Ok to refill?

## 2013-06-30 NOTE — Telephone Encounter (Signed)
Lm on pts vm indicating Rx is available at the front desk for pickup;pt informed govt issued photo id required for pickup

## 2013-07-21 ENCOUNTER — Other Ambulatory Visit: Payer: Self-pay | Admitting: Family Medicine

## 2013-07-21 NOTE — Telephone Encounter (Signed)
Spoke with pt and informed her Rx has been faxed to requested pharmacy 

## 2013-07-21 NOTE — Telephone Encounter (Signed)
Pt requesting medication refill. Last ov 02/2013 with no future appts scheduled. pls advise. 

## 2013-07-28 ENCOUNTER — Other Ambulatory Visit: Payer: Self-pay | Admitting: Family Medicine

## 2013-07-28 NOTE — Telephone Encounter (Signed)
Pt requesting medication refill. Last ov 02/2013 with no future appts scheduled. pls advise. 

## 2013-07-29 MED ORDER — HYDROCODONE-ACETAMINOPHEN 5-325 MG PO TABS: ORAL_TABLET | ORAL | Status: DC

## 2013-07-29 NOTE — Telephone Encounter (Signed)
Spoke to pt and informed her Rx available for pickup at the front desk; informed a gov't issued photo id required for pickup

## 2013-08-29 ENCOUNTER — Other Ambulatory Visit: Payer: Self-pay | Admitting: Family Medicine

## 2013-08-29 NOTE — Telephone Encounter (Signed)
Ok to print and put in my box for signature. 

## 2013-08-29 NOTE — Telephone Encounter (Signed)
Spoke to pt and informed her Rx is available for pickup; informed a gov't issued photo id required for pickup 

## 2013-08-29 NOTE — Telephone Encounter (Signed)
Pt requesting medication refill. Last ov 02/2013 with no future appts scheduled. pls advise. 

## 2013-09-02 ENCOUNTER — Encounter: Payer: Self-pay | Admitting: Family Medicine

## 2013-09-02 ENCOUNTER — Encounter: Payer: Self-pay | Admitting: *Deleted

## 2013-09-02 NOTE — Telephone Encounter (Signed)
Encounter opened in error

## 2013-09-25 ENCOUNTER — Encounter: Payer: Self-pay | Admitting: Family Medicine

## 2013-10-03 ENCOUNTER — Other Ambulatory Visit: Payer: Self-pay | Admitting: Family Medicine

## 2013-10-06 ENCOUNTER — Other Ambulatory Visit: Payer: Self-pay | Admitting: Family Medicine

## 2013-10-06 NOTE — Telephone Encounter (Signed)
Pt requesting medication refill. Last ov 02/2013 with no f/u appt scheduled. pls advise

## 2013-10-06 NOTE — Telephone Encounter (Signed)
Spoke to pt and informed her Rx is available for pickup at the front desk;pt informed a gov't issued photo id required for pickup 

## 2013-10-30 ENCOUNTER — Other Ambulatory Visit: Payer: Self-pay | Admitting: Family Medicine

## 2013-10-30 NOTE — Telephone Encounter (Signed)
Pt requesting medication refill. Last ov 02/2013 with no future appts scheduled. pls advise. 

## 2013-10-30 NOTE — Telephone Encounter (Signed)
Ok to print and put in my box for signature. 

## 2013-10-31 NOTE — Telephone Encounter (Signed)
Contact pt and informed her she would need f/u appt for any additional refills. She states that she will p/u Rx at f/u appt

## 2013-11-04 ENCOUNTER — Encounter: Payer: Self-pay | Admitting: Family Medicine

## 2013-11-04 ENCOUNTER — Ambulatory Visit (INDEPENDENT_AMBULATORY_CARE_PROVIDER_SITE_OTHER): Payer: Commercial Managed Care - HMO | Admitting: Family Medicine

## 2013-11-04 VITALS — BP 128/74 | HR 72 | Temp 97.9°F | Ht 64.5 in | Wt 150.5 lb

## 2013-11-04 DIAGNOSIS — G8929 Other chronic pain: Secondary | ICD-10-CM

## 2013-11-04 MED ORDER — HYDROCODONE-ACETAMINOPHEN 5-325 MG PO TABS: ORAL_TABLET | ORAL | Status: DC

## 2013-11-04 MED ORDER — TIZANIDINE HCL 2 MG PO TABS: ORAL_TABLET | ORAL | Status: DC

## 2013-11-04 MED ORDER — TRAMADOL HCL 50 MG PO TABS: ORAL_TABLET | ORAL | Status: DC

## 2013-11-04 NOTE — Progress Notes (Signed)
Pre visit review using our clinic review tool, if applicable. No additional management support is needed unless otherwise documented below in the visit note. 

## 2013-11-04 NOTE — Assessment & Plan Note (Signed)
>  15 minutes spent in face to face time with patient, >50% spent in counselling or coordination of care Explained office policy and apologized for the inconvenience. Encouraged her to speak to our office manager. The patient indicates understanding of these issues and agrees with the plan.

## 2013-11-04 NOTE — Progress Notes (Signed)
53 yo pleasant female with compl54ex medical history here for follow up.  Pain syndrome- very complex.  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 She is due for another ulnar surgery but has put it off due to her grand son's leukemia treatments.Current pain regimen is working "ok."  Tries to stay active but in pain all the time. Take Vicodin 5-500 four times daily, Tramadol 100 mg every 6 hours, Flexeril and Tizanidine 2 mg two times a day as needed.  Also has a Lidoderm patch. A lot of her pain is neuropathic.  Could not tolerate Gapabentin.    She is very upset that she was told to come in here today.  "I have been on the same meds for years. I should not have to leave a urine sample twice a year."     Patient Active Problem List   Diagnosis Date Noted  . Routine general medical examination at a health care facility 10/27/2010  . UNSPECIFIED DISORDER OF KIDNEY AND URETER 03/21/2010  . MAMMOGRAM, ABNORMAL 05/19/2008  . VITAMIN D DEFICIENCY 12/25/2007  . ANEMIA 12/25/2007  . REFLEX SYMPATHETIC DYSTROPHY 04/15/2007  . OSTEOARTHRITIS, SPINE 04/15/2007  . VENEREAL WART 04/09/2007  . PAIN, CHRONIC NEC 04/09/2007  . CARPAL TUNNEL SYNDROME 04/09/2007  . STRICTURE, ESOPHAGEAL 04/09/2007  . POLYDACTYLY, HANDS 04/09/2007   Past Medical History  Diagnosis Date  . GERD (gastroesophageal reflux disease)   . Pain syndrome, chronic     complex region   Past Surgical History  Procedure Laterality Date  . Carpal tunnel release  01/2006  . Rotator cuff repair  08/2000  . Thumb surgery      pollicized left  . Hand surgery      left   History  Substance Use Topics  . Smoking status: Never Smoker   . Smokeless tobacco: Not on file  . Alcohol Use: Not on file   No family history on file. Allergies  Allergen Reactions  . Latex   . Clonidine Hydrochloride   . Codeine     REACTION: anaphylaxis  . Hydromorphone Hcl    . Morphine     REACTION: anaphylaxis  . Oxycodone-Acetaminophen     REACTION: anaphylaxis  . Prednisone     REACTION: difficulty breathing  . Sulfamethoxazole-Trimethoprim     REACTION: rash  . Sulfonamide Derivatives     REACTION: urticaria (hives)   Current Outpatient Prescriptions on File Prior to Visit  Medication Sig Dispense Refill  . albuterol (PROVENTIL,VENTOLIN) 90 MCG/ACT inhaler Inhale 2 puffs into the lungs every 6 (six) hours as needed.        . hydrochlorothiazide (HYDRODIURIL) 25 MG tablet TAKE 1/2 OR 1 TABLET DAILY AS NEEDED FORSWELLING  30 tablet  1  . HYDROcodone-acetaminophen (NORCO/VICODIN) 5-325 MG per tablet TAKE 1 TO 2 TABLETS BY MOUTH EVERY SIX HOURS AS NEEDED FOR PAIN  180 tablet  0  . Lidocaine (LIDODERM EX) Apply thinnly to tender areas on left shoulder 2-3 times daily       . lidocaine (LIDODERM) 5 % 1-2 patches on effected area, leave on for no longer than 12 hours       . tiZANidine (ZANAFLEX) 2 MG tablet TAKE 1 TABLET BY MOUTH TWICE A DAY  60 tablet  0  . traMADol (ULTRAM) 50 MG tablet TAKE 1 TO 2 TABLETS BY MOUTH EVERY SIX HOURS AS NEEDED FOR PAIN  90 tablet  0   No current facility-administered medications  on file prior to visit.     The PMH, PSH, Social History, Family History, Medications, and allergies have been reviewed in [facility], and have been updated if relevant.   Review of Systems       See HPI    Physical Exam Ht 5' 4.5" (1.638 m)  Wt 150 lb 8 oz (68.266 kg)  BMI 25.44 kg/m2 Wt Readings from Last 3 Encounters:  11/04/13 150 lb 8 oz (68.266 kg)  02/27/13 148 lb (67.132 kg)  02/13/12 144 lb (65.318 kg)    General:  Well-developed,well-nourished,in no acute distress; alert,appropriate and cooperative throughout examination Head:  normocephalic and atraumatic.   Psych:  Cognition and judgment appear intact. Alert and cooperative with normal attention span and concentration. No apparent delusions, illusions, hallucinations

## 2013-11-13 ENCOUNTER — Telehealth: Payer: Self-pay | Admitting: Family Medicine

## 2013-11-13 NOTE — Telephone Encounter (Signed)
Dr. Dayton Martes, Pt called and was concerned about the fact that she has to get the [employer] screening every 6 months.  She is low risk.  Could it be once per year?  Thanks!

## 2013-11-13 NOTE — Telephone Encounter (Signed)
Yes I am absolutely fine with this for her!  I didn't know could change it to once a year.

## 2013-11-14 ENCOUNTER — Telehealth: Payer: Self-pay | Admitting: *Deleted

## 2013-11-14 DIAGNOSIS — Q690 Accessory finger(s): Secondary | ICD-10-CM

## 2013-11-14 NOTE — Telephone Encounter (Signed)
Spoke to pt and advised her that unfortunately she will have to be seen every 58mos due to taking a controlled substance. Pt verbally expressed understanding.

## 2013-11-14 NOTE — Telephone Encounter (Signed)
Pt also wants to know if she can only come once a year to get her meds filled.  She has a grandson who is 4 with Leukemia and this makes her nervous to come in more than she needs to.  She wants to know if an exception can be made.  Also, patient states she marked through 3 meds she is no longer taking, but she will get this corrected when she comes in September.

## 2013-11-14 NOTE — Telephone Encounter (Signed)
It's not the norm but some providers do choose for it to be once per year.  Thank you!!

## 2013-11-14 NOTE — Telephone Encounter (Signed)
Spoke to pt who states she recently changed to [facility] and is needing a referral to ortho. Pt will be seeing Dr Remonia Richter in [facility]. This is a f/u from surgery and appts is June 17 and is needing referral completed before that time.

## 2013-11-17 DIAGNOSIS — M5416 Radiculopathy, lumbar region: Secondary | ICD-10-CM | POA: Insufficient documentation

## 2013-11-17 DIAGNOSIS — M5459 Other low back pain: Secondary | ICD-10-CM | POA: Insufficient documentation

## 2013-11-17 DIAGNOSIS — M545 Low back pain: Secondary | ICD-10-CM | POA: Insufficient documentation

## 2013-11-17 NOTE — Telephone Encounter (Signed)
Referral placed.

## 2013-11-18 ENCOUNTER — Telehealth: Payer: Self-pay | Admitting: Family Medicine

## 2013-11-18 ENCOUNTER — Other Ambulatory Visit: Payer: Self-pay

## 2013-11-18 DIAGNOSIS — IMO0002 Reserved for concepts with insufficient information to code with codable children: Secondary | ICD-10-CM

## 2013-11-18 DIAGNOSIS — M5137 Other intervertebral disc degeneration, lumbosacral region: Secondary | ICD-10-CM

## 2013-11-18 NOTE — Telephone Encounter (Signed)
Candrell w/[facility] registration called and says pt is scheduled to have steroid injection in her back on December 04, 2013.  Candrell spoke w/[employer] to verify coverage and they told her pt will need a referral from PCP prior to surgery.  The CPT code is 62311 w/dx codes of 722.52 and 724.4. Can you place referral? Candrell's [phone] for any questions. Thank you.

## 2013-11-18 NOTE — Telephone Encounter (Signed)
Order placed

## 2013-11-26 ENCOUNTER — Other Ambulatory Visit: Payer: Self-pay | Admitting: Family Medicine

## 2013-12-01 ENCOUNTER — Other Ambulatory Visit: Payer: Self-pay | Admitting: Family Medicine

## 2013-12-01 NOTE — Telephone Encounter (Signed)
Pt requesting medication refill. Last f/u ov 10/2013 with no future appts scheduled. pls advise 

## 2013-12-02 NOTE — Telephone Encounter (Signed)
Printed and placed in Kim's box. 

## 2013-12-02 NOTE — Telephone Encounter (Signed)
Pt informed that RX ready to be picked up at front desk. 

## 2013-12-12 ENCOUNTER — Other Ambulatory Visit: Payer: Self-pay | Admitting: *Deleted

## 2013-12-12 ENCOUNTER — Telehealth: Payer: Self-pay

## 2013-12-12 MED ORDER — HYDROCHLOROTHIAZIDE 25 MG PO TABS: ORAL_TABLET | ORAL | Status: DC

## 2013-12-12 MED ORDER — TIZANIDINE HCL 2 MG PO TABS: ORAL_TABLET | ORAL | Status: DC

## 2013-12-12 NOTE — Telephone Encounter (Signed)
Pt requesting medication refill. Last f/u appt 10/2013. Pt recently switched to mailorder and is needing new Rx

## 2013-12-12 NOTE — Telephone Encounter (Signed)
Pt left v/m; [facility]; pt wanted Dr Elmer SowAron's assistant to know that [facility] Tizanidine,tramadol and HCTZ refills and that pt does [facility].

## 2013-12-13 MED ORDER — TRAMADOL HCL 50 MG PO TABS: ORAL_TABLET | ORAL | Status: DC

## 2013-12-15 MED ORDER — TRAMADOL HCL 50 MG PO TABS: ORAL_TABLET | ORAL | Status: DC

## 2013-12-15 NOTE — Telephone Encounter (Signed)
Lm on pts vm informing her Rx has been sent to new requested pharmacy

## 2013-12-15 NOTE — Addendum Note (Signed)
Addended by: Desmond DikeKNIGHT, Makenzie Weisner H on: 12/15/2013 08:58 AM   Modules accepted: Orders

## 2013-12-18 MED ORDER — TRAMADOL HCL 50 MG PO TABS: ORAL_TABLET | ORAL | Status: DC

## 2013-12-30 ENCOUNTER — Other Ambulatory Visit: Payer: Self-pay | Admitting: Family Medicine

## 2013-12-30 NOTE — Telephone Encounter (Signed)
Spoke to pt and informed her Rx is available at the front desk for pickup

## 2013-12-30 NOTE — Telephone Encounter (Signed)
Pt requesting medication refill. Last f/u appt 10/2013 with no future appts scheduled. pls advise 

## 2014-01-19 DIAGNOSIS — M7918 Myalgia, other site: Secondary | ICD-10-CM | POA: Insufficient documentation

## 2014-01-26 ENCOUNTER — Other Ambulatory Visit: Payer: Self-pay | Admitting: Family Medicine

## 2014-01-26 MED ORDER — HYDROCODONE-ACETAMINOPHEN 5-325 MG PO TABS: ORAL_TABLET | ORAL | Status: DC

## 2014-01-26 NOTE — Telephone Encounter (Signed)
Spoke to pt and informed her Rx will be available for pickup after 1300 01/27/14.

## 2014-01-26 NOTE — Addendum Note (Signed)
Addended by: Desmond DikeKNIGHT, Camaron Cammack H on: 01/26/2014 01:51 PM   Modules accepted: Orders

## 2014-01-26 NOTE — Telephone Encounter (Signed)
Pt requesting medication refill. Last f/u appt 10/2013 with no future appts scheduled. pls advise 

## 2014-02-25 ENCOUNTER — Other Ambulatory Visit: Payer: Self-pay | Admitting: Family Medicine

## 2014-02-25 ENCOUNTER — Other Ambulatory Visit: Payer: Self-pay

## 2014-02-25 DIAGNOSIS — Z1231 Encounter for screening mammogram for malignant neoplasm of breast: Secondary | ICD-10-CM

## 2014-02-25 NOTE — Telephone Encounter (Signed)
Ok to refill 

## 2014-02-26 NOTE — Telephone Encounter (Signed)
Patient notified and Rx placed up front for pick up. 

## 2014-03-05 ENCOUNTER — Encounter: Payer: Self-pay | Admitting: Radiology

## 2014-03-05 ENCOUNTER — Other Ambulatory Visit: Payer: Self-pay | Admitting: Family Medicine

## 2014-03-05 ENCOUNTER — Other Ambulatory Visit (INDEPENDENT_AMBULATORY_CARE_PROVIDER_SITE_OTHER): Payer: Commercial Managed Care - HMO

## 2014-03-05 ENCOUNTER — Telehealth: Payer: Self-pay | Admitting: Radiology

## 2014-03-05 DIAGNOSIS — Z136 Encounter for screening for cardiovascular disorders: Secondary | ICD-10-CM

## 2014-03-05 DIAGNOSIS — D649 Anemia, unspecified: Secondary | ICD-10-CM

## 2014-03-05 DIAGNOSIS — E559 Vitamin D deficiency, unspecified: Secondary | ICD-10-CM

## 2014-03-05 DIAGNOSIS — Z0000 Encounter for general adult medical examination without abnormal findings: Secondary | ICD-10-CM

## 2014-03-05 LAB — COMPREHENSIVE METABOLIC PANEL
ALBUMIN: 4.1 g/dL (ref 3.5–5.2)
ALT: 11 U/L (ref 0–35)
AST: 13 U/L (ref 0–37)
Alkaline Phosphatase: 77 U/L (ref 39–117)
BUN: 12 mg/dL (ref 6–23)
CALCIUM: 9.5 mg/dL (ref 8.4–10.5)
CHLORIDE: 102 meq/L (ref 96–112)
CO2: 28 meq/L (ref 19–32)
Creatinine, Ser: 0.7 mg/dL (ref 0.4–1.2)
GFR: 95.65 mL/min (ref >60.00)
Glucose, Bld: 95 mg/dL (ref 70–99)
Potassium: 3.5 mEq/L (ref 3.5–5.1)
Sodium: 140 mEq/L (ref 135–145)
TOTAL PROTEIN: 7.8 g/dL (ref 6.0–8.3)
Total Bilirubin: 0.2 mg/dL (ref 0.2–1.2)

## 2014-03-05 LAB — CBC WITH DIFFERENTIAL/PLATELET
BASOS PCT: 0.5 % (ref 0.0–3.0)
Basophils Absolute: 0 10*3/uL (ref 0.0–0.1)
EOS PCT: 5 % (ref 0.0–5.0)
Eosinophils Absolute: 0.2 10*3/uL (ref 0.0–0.7)
HCT: 37.8 % (ref 36.0–46.0)
Hemoglobin: 12.3 g/dL (ref 12.0–15.0)
Lymphocytes Relative: 32.7 % (ref 12.0–46.0)
Lymphs Abs: 1.4 10*3/uL (ref 0.7–4.0)
MCHC: 32.5 g/dL (ref 30.0–36.0)
MCV: 89.4 fl (ref 78.0–100.0)
MONOS PCT: 9.2 % (ref 3.0–12.0)
Monocytes Absolute: 0.4 10*3/uL (ref 0.1–1.0)
NEUTROS PCT: 52.6 % (ref 43.0–77.0)
Neutro Abs: 2.2 10*3/uL (ref 1.4–7.7)
Platelets: 281 10*3/uL (ref 150.0–400.0)
RBC: 4.22 Mil/uL (ref 3.87–5.11)
RDW: 15 % (ref 11.5–15.5)
WBC: 4.2 10*3/uL (ref 4.0–10.5)

## 2014-03-05 LAB — LIPID PANEL
Cholesterol: 158 mg/dL (ref 0–200)
HDL: 51.6 mg/dL (ref >39.00)
LDL Cholesterol: 90 mg/dL (ref 0–99)
NonHDL: 106.4
TRIGLYCERIDES: 80 mg/dL (ref 0.0–149.0)
Total CHOL/HDL Ratio: 3
VLDL: 16 mg/dL (ref 0.0–40.0)

## 2014-03-05 LAB — TSH: TSH: 0.73 u[IU]/mL (ref 0.35–4.50)

## 2014-03-05 LAB — VITAMIN D 25 HYDROXY (VIT D DEFICIENCY, FRACTURES): VITD: 33.91 ng/mL (ref 30.00–100.00)

## 2014-03-05 NOTE — Telephone Encounter (Signed)
I spoke to [facility] ([employer] Rep), He said,  the patient was mailed a check from her [employer] on March 18.2015, to pay [employer]. LMOM for patient to call me.

## 2014-03-05 NOTE — Telephone Encounter (Signed)
FYI Patient came in for labs today, I ask to update her UDS, pt didn't want to, said she was billed $1,100  for the last one. Became agitated,( I told her I would check into that,( no patient is to pay out of pocket.) She did leave a urine, didn't sign another contract. I have spoke to [place], he will look into it and return my call.

## 2014-03-09 ENCOUNTER — Encounter: Payer: Self-pay | Admitting: Radiology

## 2014-03-09 NOTE — Telephone Encounter (Signed)
Pt never returned my call, I sent her a letter.

## 2014-03-11 ENCOUNTER — Ambulatory Visit
Admission: RE | Admit: 2014-03-11 | Discharge: 2014-03-11 | Disposition: A | Discharge status: Home / Self Care | Payer: Commercial Managed Care - HMO | Source: Ambulatory Visit

## 2014-03-11 DIAGNOSIS — Z1231 Encounter for screening mammogram for malignant neoplasm of breast: Secondary | ICD-10-CM

## 2014-03-12 ENCOUNTER — Other Ambulatory Visit (HOSPITAL_COMMUNITY)
Admission: RE | Admit: 2014-03-12 | Discharge: 2014-03-12 | Disposition: A | Discharge status: Home / Self Care | Payer: Commercial Managed Care - HMO | Source: Ambulatory Visit | Attending: Family Medicine | Admitting: Family Medicine

## 2014-03-12 ENCOUNTER — Encounter: Payer: Self-pay | Admitting: Family Medicine

## 2014-03-12 ENCOUNTER — Ambulatory Visit (INDEPENDENT_AMBULATORY_CARE_PROVIDER_SITE_OTHER): Payer: Commercial Managed Care - HMO | Admitting: Family Medicine

## 2014-03-12 VITALS — BP 118/74 | HR 66 | Temp 98.3°F | Ht 64.25 in | Wt 153.8 lb

## 2014-03-12 DIAGNOSIS — Z01419 Encounter for gynecological examination (general) (routine) without abnormal findings: Secondary | ICD-10-CM | POA: Diagnosis present

## 2014-03-12 DIAGNOSIS — Z1151 Encounter for screening for human papillomavirus (HPV): Secondary | ICD-10-CM | POA: Diagnosis present

## 2014-03-12 DIAGNOSIS — Z0000 Encounter for general adult medical examination without abnormal findings: Secondary | ICD-10-CM

## 2014-03-12 DIAGNOSIS — G9050 Complex regional pain syndrome I, unspecified: Secondary | ICD-10-CM

## 2014-03-12 NOTE — Assessment & Plan Note (Signed)
Reviewed preventive care protocols, scheduled due services, and updated immunizations Discussed nutrition, exercise, diet, and healthy lifestyle.  Declines colon CA screening. Declines influenza vaccination.

## 2014-03-12 NOTE — Progress Notes (Signed)
53 yo pleasant female with complex medical history  here for CPX.   Pain syndrome- very complex.  Has polydactyl and other associated abnormalities requiring multiple surgeries per year with specialist at [facility].  Tries to stay active but in pain all the time. Take Vicodin 5-500 four times daily, Tramadol 100 mg every 6 hours, Flexeril and Tizanidine 2 mg two times a day as needed.  Also has a Lidoderm patch.  Well woman- due for colonoscopy but refusing.   Refusing IFOB as well .  Denies any changes in her bowel habits or blood in her stool.  Mammogram done yesterday- 03/11/14.  UTD pap smear- last done by me in 10/2010.  No h/o abnormal pap smears.  Lab Results  Component Value Date   CHOL 158 03/05/2014   HDL 51.60 03/05/2014   LDLCALC 90 03/05/2014   LDLDIRECT 138.8 02/25/2013   TRIG 80.0 03/05/2014   CHOLHDL 3 03/05/2014    Lab Results  Component Value Date   NA 140 03/05/2014   K 3.5 03/05/2014   CL 102 03/05/2014   CO2 28 03/05/2014    Lab Results  Component Value Date   CREATININE 0.7 03/05/2014   Lab Results  Component Value Date   TSH 0.73 03/05/2014   Lab Results  Component Value Date   WBC 4.2 03/05/2014   HGB 12.3 03/05/2014   HCT 37.8 03/05/2014   MCV 89.4 03/05/2014   PLT 281.0 03/05/2014    Patient Active Problem List   Diagnosis Date Noted  . Encounter for routine gynecological examination 03/12/2014  . Routine general medical examination at a health care facility 10/27/2010  . UNSPECIFIED DISORDER OF KIDNEY AND URETER 03/21/2010  . MAMMOGRAM, ABNORMAL 05/19/2008  . VITAMIN D DEFICIENCY 12/25/2007  . ANEMIA 12/25/2007  . REFLEX SYMPATHETIC DYSTROPHY 04/15/2007  . OSTEOARTHRITIS, SPINE 04/15/2007  . VENEREAL WART 04/09/2007  . PAIN, CHRONIC NEC 04/09/2007  . CARPAL TUNNEL SYNDROME 04/09/2007  . STRICTURE, ESOPHAGEAL 04/09/2007  . POLYDACTYLY, HANDS 04/09/2007   Past Medical History  Diagnosis Date  . GERD (gastroesophageal reflux disease)   . Pain  syndrome, chronic     complex region   Past Surgical History  Procedure Laterality Date  . Carpal tunnel release  01/2006  . Rotator cuff repair  08/2000  . Thumb surgery      pollicized left  . Hand surgery      left   History  Substance Use Topics  . Smoking status: Never Smoker   . Smokeless tobacco: Not on file  . Alcohol Use: Not on file   No family history on file. Allergies  Allergen Reactions  . Hydromorphone Hcl Anaphylaxis and Itching  . Latex   . Clonidine Hydrochloride   . Codeine     REACTION: anaphylaxis  . Dilaudid [Hydromorphone Hcl] Itching and Dermatitis  . Morphine     REACTION: anaphylaxis  . Oxycodone-Acetaminophen     REACTION: anaphylaxis  . Prednisone     REACTION: difficulty breathing  . Sulfamethoxazole-Trimethoprim     REACTION: rash  . Sulfonamide Derivatives     REACTION: urticaria (hives)   Current Outpatient Prescriptions on File Prior to Visit  Medication Sig Dispense Refill  . albuterol (PROVENTIL,VENTOLIN) 90 MCG/ACT inhaler Inhale 2 puffs into the lungs every 6 (six) hours as needed.        . hydrochlorothiazide (HYDRODIURIL) 25 MG tablet TAKE 1/2 TO 1 TABLET BY MOUTH ONCE A DAYAS NEEDED FOR SWELLING  90 tablet  0  .  HYDROcodone-acetaminophen (NORCO/VICODIN) 5-325 MG per tablet TAKE 1 TO 2 TABLETS BY MOUTH EVERY 6 HOURS AS NEEDED FOR PAIN  180 tablet  0  . Lidocaine (LIDODERM EX) Apply thinnly to tender areas on left shoulder 2-3 times daily       . lidocaine (LIDODERM) 5 % 1-2 patches on effected area, leave on for no longer than 12 hours       . tiZANidine (ZANAFLEX) 2 MG tablet TAKE 1 TABLET BY MOUTH TWICE A DAY  90 tablet  0  . traMADol (ULTRAM) 50 MG tablet TAKE 1 TO 2 TABLETS BY MOUTH EVERY SIX HOURS AS NEEDED FOR PAIN  270 tablet  0   No current facility-administered medications on file prior to visit.     The PMH, PSH, Social History, Family History, Medications, and allergies have been reviewed in [employer], and have been  updated if relevant.   Review of Systems       See HPI  Denies anxiety or depression- does get "irritable"- caring for her grandson with leukemia No breast lumps or changes  Denies abnormal vaginal discharge or dysuria Denies changes in bowel habits or blood in her stool +LE edema- chronic issue No CP or SOB No rashes No abnormal vaginal discharge  Physical Exam BP 118/74  Pulse 66  Temp(Src) 98.3 F (36.8 C) (Oral)  Ht 5' 4.25" (1.632 m)  Wt 153 lb 12 oz (69.741 kg)  BMI 26.18 kg/m2  SpO2 96% Wt Readings from Last 3 Encounters:  03/12/14 153 lb 12 oz (69.741 kg)  11/04/13 150 lb 8 oz (68.266 kg)  02/27/13 148 lb (67.132 kg)    General:  Well-developed,well-nourished,in no acute distress; alert,appropriate and cooperative throughout examination Head:  normocephalic and atraumatic.   Eyes:  vision grossly intact, pupils equal, pupils round, and pupils reactive to light.   Ears:  R ear normal and L ear normal.   Nose:  no external deformity.   Mouth:  good dentition.   Neck:  No deformities, masses, or tenderness noted. Breasts:  No mass, nodules, thickening, tenderness, bulging, retraction, inflamation, nipple discharge or skin changes noted.   Lungs:  Normal respiratory effort, chest expands symmetrically. Lungs are clear to auscultation, no crackles or wheezes. Heart:  Normal rate and regular rhythm. S1 and S2 normal without gallop, murmur, click, rub or other extra sounds. Abdomen:  Bowel sounds positive,abdomen soft and non-tender without masses, organomegaly or hernias noted. Rectal:  no external abnormalities.   Genitalia:  Pelvic Exam:        External: normal female genitalia without lesions or masses        Vagina: normal without lesions or masses        Cervix: normal without lesions or masses        Adnexa: normal bimanual exam without masses or fullness        Uterus: normal by palpation        Pap smear: performed Msk:  No deformity or scoliosis noted of  thoracic or lumbar spine.   Extremities:  No clubbing, cyanosis, edema, or deformity noted with normal full range of motion of all joints.   Neurologic:  alert & oriented X3 and gait normal.   Skin:  Intact without suspicious lesions or rashes Cervical Nodes:  No lymphadenopathy noted Axillary Nodes:  No palpable lymphadenopathy Psych:  Cognition and judgment appear intact. Alert and cooperative with normal attention span and concentration. No apparent delusions, illusions, hallucinations

## 2014-03-12 NOTE — Progress Notes (Signed)
Pre visit review using our clinic review tool, if applicable. No additional management support is needed unless otherwise documented below in the visit note. 

## 2014-03-12 NOTE — Assessment & Plan Note (Signed)
Pap smear done today. Mammogram done yesterday.

## 2014-03-13 LAB — CYTOLOGY - PAP

## 2014-03-16 ENCOUNTER — Encounter: Payer: Self-pay | Admitting: *Deleted

## 2014-03-20 ENCOUNTER — Encounter: Payer: Self-pay | Admitting: Family Medicine

## 2014-03-25 ENCOUNTER — Other Ambulatory Visit: Payer: Self-pay | Admitting: Family Medicine

## 2014-03-25 NOTE — Telephone Encounter (Signed)
Pt requesting medication refill. Last f/u appt 02/2014-CPE. Ok to fill per Dr Dayton MartesAron. Spoke to pt and informed her Rx will be available for pickup after 1500

## 2014-03-30 ENCOUNTER — Telehealth: Payer: Self-pay | Admitting: *Deleted

## 2014-03-30 NOTE — Telephone Encounter (Signed)
Rx request received today via fax per [employer]. Req refill of tramadol,hctz and tizanidine. Per Dr Aron pt is unable to receive tramadol via mailorder. Pt currently prescribed #270/mo and Dr Aron states that she will have to continue to have filled at local pharmacy. She is able to have additional meds sent through mail order, but we are unable to provide 15m209KoSelect Specialty Hospital - [phone] For [facility] Day SMVT31mo3KoTrails Edge Surger984 ZOOl244[facility] MV476mo260KoSacramento Eye804-ZOOl244[facility] MVTyso39mo334KoPembina County Memor78ZOOl244Community Sub[facility] MVTysonVer178mo231KoMarshfield Clini541 ZOOl244[facility] Cancer InsMVT222mo512KoProvidence Hood River Memor906-ZOOl244[facility] HoMVTyson558mo(810)KoHot Springs County Memor86ZOOl244[facility] FortMVTyson646Little Rive847Ol244Northlake Behavioral HealthTys68mo754KoSentara Ki715-ZOOl244Placentia Linda HoMVTyso971mo(408KoUniversity Pavilion - Psychiat(281)ZOOl244[facility] MVTy830mo810KoAbbeville Gene941-ZOOl244Urology Surgical CentMVTyson334mo503KoNorth Ms St(956)ZOOl244[place] HoMVTyson476mo5KoTrinitas Hospital - New 807-ZOOl244North Central HealtMVTys729mo(936KoCalifornia Pacific Med Ctr-D580-ZOOl244[facility] SouMVTysonShe567mo864KoThe Maryland Center For Digestiv(832) ZOOl244Ace Endoscopy And Surgery MVTysonV4100mo769KoMobile Pax Ltd Dba Mobile Su781-ZOOl244Apollo HoMVTys486mo770KoRay County Memor820-ZOOl244[facility] Center For Gastrointestinal EndoscoMVT562mo2KoOutpatient Surgical 508-ZOOl244Surgical Institute Of MVTyso757mo805KoElbert Memor([phone])ZOOl244Decatur MorgaM544mo819KoAlliance Healt445-ZOOl244[facility] - MVTysonCor326mo740KoSurgical Specialty As225-ZOOl244[facility] HoMVTys425mo(236)KoSt. Anthon435-ZOOl244Medical City Las CMVTys160mo7KoNorth Texas St908-ZOOl244Upmc PasMVT327mo404KoKanis Endo352-ZOOl244[facility] MVTyso317mo570KoHoldenville Gene620-ZOOl244[facility] Surgery MVTysonMonser722mo434KoMclaren Cent540-ZOOl244C[facility] Memorial HoMVTy190mo401KoColorado Mental Health Institute At 509-ZOOl244[facility] MVTys526mo847KoUsmd Hospital ([phone])ZOOl244[facility] Medical MVTy436mo5KoBrazosport E(334) ZOOl244Ohio Specialty Surgical SuitMV472mo417KoAssurance Healt(626)ZOOl244[facility] Medical M20mo949KoVibra Hospital Of Norther605-ZOOl244Med[facility] Memorial HoMV535mo218KoOrange Park Me973-ZOOl244Outpatient Surgical Specialties M31mo317KoHshs St Elizabet364-ZOOl244[facility] [place] [facility] MVTy532mo267KoTy Cobb Healthcare System - Hart Cou862-ZOOl244Mercy HoMVT418mo501KoPalos Hills Su501-ZOOl244Nicholas H Noyes Memorial HoMVTy11sonAdrina, Armijodol at #810. Lm on pts vm requesting a call back

## 2014-03-31 ENCOUNTER — Telehealth: Payer: Self-pay | Admitting: Family Medicine

## 2014-03-31 NOTE — Telephone Encounter (Signed)
Patient returned your call.

## 2014-04-01 ENCOUNTER — Other Ambulatory Visit: Payer: Self-pay | Admitting: *Deleted

## 2014-04-01 MED ORDER — TIZANIDINE HCL 2 MG PO TABS: ORAL_TABLET | ORAL | Status: DC

## 2014-04-01 MED ORDER — HYDROCHLOROTHIAZIDE 25 MG PO TABS: ORAL_TABLET | ORAL | Status: DC

## 2014-04-01 MED ORDER — TRAMADOL HCL 50 MG PO TABS: ORAL_TABLET | ORAL | Status: DC

## 2014-04-01 NOTE — Telephone Encounter (Signed)
Spoke to pt and advised her that per Dr Dayton MartesAron, she unable to receive #810 of tramadol. Pt verbally expressed understanding. Tizanidine and hctz sent mail order; tramadol to be faxed to requested pharmacy before end of day, today

## 2014-04-27 ENCOUNTER — Other Ambulatory Visit: Payer: Self-pay

## 2014-04-27 MED ORDER — HYDROCODONE-ACETAMINOPHEN 5-325 MG PO TABS: ORAL_TABLET | ORAL | Status: DC

## 2014-04-27 NOTE — Telephone Encounter (Signed)
Pt left v/m requesting rx hydrocodone apap. Call when ready for pick up.  

## 2014-04-27 NOTE — Telephone Encounter (Signed)
Spoke to pt and informed her Rx is available for pick up at the front desk 

## 2014-05-22 ENCOUNTER — Other Ambulatory Visit: Payer: Self-pay | Admitting: Family Medicine

## 2014-05-22 NOTE — Telephone Encounter (Signed)
Rx not due. Ok to wait until Dr Elmer SowAron's return

## 2014-05-25 NOTE — Telephone Encounter (Signed)
Pt requesting medication refill. Last f/u appt 02/2014-CPE. pls advise 

## 2014-05-25 NOTE — Telephone Encounter (Signed)
Spoke to pt and informed her Rx will be available for pickup after 1400 

## 2014-05-30 ENCOUNTER — Other Ambulatory Visit: Payer: Self-pay | Admitting: Family Medicine

## 2014-06-01 ENCOUNTER — Other Ambulatory Visit: Payer: Self-pay | Admitting: *Deleted

## 2014-06-01 MED ORDER — HYDROCHLOROTHIAZIDE 25 MG PO TABS: ORAL_TABLET | ORAL | Status: DC

## 2014-06-01 NOTE — Telephone Encounter (Signed)
Pt requesting medication refill. Last f/u appt 02/2014-CPE. pls advise 

## 2014-06-01 NOTE — Telephone Encounter (Signed)
Rx called in to requested pharmacy 

## 2014-06-02 ENCOUNTER — Other Ambulatory Visit: Payer: Self-pay

## 2014-06-02 ENCOUNTER — Telehealth: Payer: Self-pay | Admitting: Family Medicine

## 2014-06-02 NOTE — Telephone Encounter (Signed)
Pt would like [facility] only for the Tramadol and Hydrocodone but [facility] the Tizanidine and Hydrochlorothiazide.

## 2014-06-02 NOTE — Telephone Encounter (Signed)
Pt left v/m requesting 90 day refill [facility].Please advise.

## 2014-06-03 MED ORDER — TIZANIDINE HCL 2 MG PO TABS
ORAL_TABLET | ORAL | Status: DC
Start: 2014-06-03 — End: 2016-03-16

## 2014-06-22 ENCOUNTER — Other Ambulatory Visit: Payer: Self-pay | Admitting: Family Medicine

## 2014-06-22 DIAGNOSIS — M25522 Pain in left elbow: Secondary | ICD-10-CM | POA: Diagnosis not present

## 2014-06-22 DIAGNOSIS — G8929 Other chronic pain: Secondary | ICD-10-CM | POA: Diagnosis not present

## 2014-06-22 DIAGNOSIS — G5631 Lesion of radial nerve, right upper limb: Secondary | ICD-10-CM | POA: Diagnosis not present

## 2014-06-22 DIAGNOSIS — G90513 Complex regional pain syndrome I of upper limb, bilateral: Secondary | ICD-10-CM | POA: Diagnosis not present

## 2014-06-22 DIAGNOSIS — M25532 Pain in left wrist: Secondary | ICD-10-CM | POA: Diagnosis not present

## 2014-06-24 MED ORDER — HYDROCHLOROTHIAZIDE 25 MG PO TABS: ORAL_TABLET | ORAL | Status: DC

## 2014-06-24 NOTE — Addendum Note (Signed)
Addended by: Desmond DikeKNIGHT, Zacharias Ridling H on: 06/24/2014 10:32 AM   Modules accepted: Orders

## 2014-06-24 NOTE — Telephone Encounter (Signed)
Pt requesting medication refill. Last f/u appt 02/2014-CPE. pls advise 

## 2014-06-24 NOTE — Telephone Encounter (Signed)
Spoke to pt and informed her Rx will be available for pickup after 1400 

## 2014-07-04 ENCOUNTER — Other Ambulatory Visit: Payer: Self-pay | Admitting: Family Medicine

## 2014-07-06 NOTE — Telephone Encounter (Signed)
Last office visit 03/12/2014.  Last refilled 06/01/2014 for #270 with no refills.  Ok to refill?

## 2014-07-06 NOTE — Telephone Encounter (Signed)
Called to [facility]. 

## 2014-07-21 DIAGNOSIS — G90511 Complex regional pain syndrome I of right upper limb: Secondary | ICD-10-CM | POA: Diagnosis not present

## 2014-07-21 DIAGNOSIS — G8918 Other acute postprocedural pain: Secondary | ICD-10-CM | POA: Diagnosis not present

## 2014-07-21 DIAGNOSIS — J45909 Unspecified asthma, uncomplicated: Secondary | ICD-10-CM | POA: Diagnosis not present

## 2014-07-21 DIAGNOSIS — M65841 Other synovitis and tenosynovitis, right hand: Secondary | ICD-10-CM | POA: Diagnosis not present

## 2014-07-21 DIAGNOSIS — G5631 Lesion of radial nerve, right upper limb: Secondary | ICD-10-CM | POA: Diagnosis not present

## 2014-07-21 DIAGNOSIS — M5416 Radiculopathy, lumbar region: Secondary | ICD-10-CM | POA: Diagnosis not present

## 2014-08-02 DIAGNOSIS — L03113 Cellulitis of right upper limb: Secondary | ICD-10-CM | POA: Diagnosis not present

## 2014-08-05 DIAGNOSIS — Z4889 Encounter for other specified surgical aftercare: Secondary | ICD-10-CM | POA: Diagnosis not present

## 2014-08-05 DIAGNOSIS — G90511 Complex regional pain syndrome I of right upper limb: Secondary | ICD-10-CM | POA: Diagnosis not present

## 2014-08-05 DIAGNOSIS — G5631 Lesion of radial nerve, right upper limb: Secondary | ICD-10-CM | POA: Diagnosis not present

## 2014-08-12 ENCOUNTER — Other Ambulatory Visit: Payer: Self-pay | Admitting: Family Medicine

## 2014-08-12 NOTE — Telephone Encounter (Signed)
Last f/u appt 02/2014 

## 2014-08-12 NOTE — Telephone Encounter (Signed)
Rx called in to requested pharmacy 

## 2014-08-20 ENCOUNTER — Other Ambulatory Visit: Payer: Self-pay | Admitting: Family Medicine

## 2014-08-24 NOTE — Telephone Encounter (Signed)
RX printed and signed given to WK- UDS reviewed

## 2014-08-24 NOTE — Telephone Encounter (Signed)
Lm on pts vm informing her Rx is available for pickup from the front desk; pt advised third party unable to pickup 

## 2014-08-24 NOTE — Telephone Encounter (Signed)
Last f/u appt 02/2014-CPE. Pt due today and Dr Dayton MartesAron is out of office

## 2014-08-26 ENCOUNTER — Encounter: Payer: Self-pay | Admitting: Family Medicine

## 2014-08-26 DIAGNOSIS — Z79891 Long term (current) use of opiate analgesic: Secondary | ICD-10-CM | POA: Diagnosis not present

## 2014-09-03 ENCOUNTER — Encounter: Payer: Self-pay | Admitting: Internal Medicine

## 2014-09-03 ENCOUNTER — Ambulatory Visit (INDEPENDENT_AMBULATORY_CARE_PROVIDER_SITE_OTHER): Payer: Commercial Managed Care - HMO | Admitting: Internal Medicine

## 2014-09-03 VITALS — BP 114/80 | HR 82 | Temp 97.9°F | Wt 153.0 lb

## 2014-09-03 DIAGNOSIS — M25512 Pain in left shoulder: Secondary | ICD-10-CM

## 2014-09-03 DIAGNOSIS — M79622 Pain in left upper arm: Secondary | ICD-10-CM

## 2014-09-03 DIAGNOSIS — N644 Mastodynia: Secondary | ICD-10-CM

## 2014-09-03 NOTE — Progress Notes (Signed)
Subjective:    Patient ID: Jessica Shelton, female    DOB: 05/08/1961, 54 y.o.   MRN: [id number]  HPI  Pt presents to the clinic today with c/o a knot under her left armpit. She reports this started about 2 months ago. The area is sore. She denies any changes in the symmetry of her breast or discharge from the nipple. She denies any trauma to the area. Her last mammogram was 02/2014, normal. She has not tried anything OTC.  Review of Systems      Past Medical History  Diagnosis Date  . GERD (gastroesophageal reflux disease)   . Pain syndrome, chronic     complex region    Current Outpatient Prescriptions  Medication Sig Dispense Refill  . albuterol (PROVENTIL,VENTOLIN) 90 MCG/ACT inhaler Inhale 2 puffs into the lungs every 6 (six) hours as needed.      . clindamycin (CLEOCIN) 150 MG capsule     . docusate sodium (COLACE) 100 MG capsule Take 100 mg by mouth.    . hydrochlorothiazide (HYDRODIURIL) 25 MG tablet TAKE 1/2 TO 1 TABLET BY MOUTH ONCE A DAYAS NEEDED FOR SWELLING 90 tablet 1  . HYDROcodone-acetaminophen (NORCO/VICODIN) 5-325 MG per tablet TAKE 1 TO 2 TABLETS BY MOUTH EVERY SIX HOURS AS NEEDED FOR PAIN 180 tablet 0  . Lidocaine (LIDODERM EX) Apply thinnly to tender areas on left shoulder 2-3 times daily     . lidocaine (LIDODERM) 5 % 1-2 patches on effected area, leave on for no longer than 12 hours     . tiZANidine (ZANAFLEX) 2 MG tablet TAKE 1 TABLET BY MOUTH TWICE A DAY 180 tablet 0  . traMADol (ULTRAM) 50 MG tablet TAKE 1 TO 2 TABLETS BY MOUTH EVERY 6 HOURS AS NEEDED FOR PAIN 270 tablet 0   No current facility-administered medications for this visit.    Allergies  Allergen Reactions  . Hydromorphone Hcl Anaphylaxis and Itching  . Latex   . Clonidine Hydrochloride   . Codeine     REACTION: anaphylaxis  . Dilaudid [Hydromorphone Hcl] Itching and Dermatitis  . Morphine     REACTION: anaphylaxis  . Oxycodone-Acetaminophen     REACTION: anaphylaxis  . Prednisone     REACTION: difficulty breathing  . Sulfamethoxazole-Trimethoprim     REACTION: rash  . Sulfonamide Derivatives     REACTION: urticaria (hives)    History reviewed. No pertinent family history.  History   Social History  . Marital Status: Divorced    Spouse Name: N/A  . Number of Children: 2  . Years of Education: N/A   Occupational History  . Works in Psychologist, counselling    Social History Main Topics  . Smoking status: Never Smoker   . Smokeless tobacco: Not on file  . Alcohol Use: No  . Drug Use: Not on file  . Sexual Activity: Not on file   Other Topics Concern  . Not on file   Social History Narrative   Divorced--remarried   Daughter is hyperreflexic, son has polydactyl     Constitutional: Denies fever, malaise, fatigue, headache or abrupt weight changes.  Respiratory: Denies difficulty breathing, shortness of breath, cough or sputum production.   Cardiovascular: Denies chest pain, chest tightness, palpitations or swelling in the hands or feet.  Gastrointestinal: Denies abdominal pain, bloating, constipation, diarrhea or blood in the stool.  Skin: Pt reports a knot under her left armpit. Denies redness, rashes, lesions or ulcercations.    No other specific complaints in a complete  review of systems (except as listed in HPI above).  Objective:   Physical Exam   BP 114/80 mmHg  Pulse 82  Temp(Src) 97.9 F (36.6 C) (Oral)  Wt 153 lb (69.4 kg)  SpO2 98% Wt Readings from Last 3 Encounters:  09/03/14 153 lb (69.4 kg)  03/12/14 153 lb 12 oz (69.741 kg)  11/04/13 150 lb 8 oz (68.266 kg)    General: Appears her stated age, well developed, well nourished in NAD. Skin: Warm, dry and intact.  Cardiovascular: Normal rate and rhythm. S1,S2 noted.  No murmur, rubs or gallops noted. Pulmonary/Chest: Normal effort and positive vesicular breath sounds. No respiratory distress. No wheezes, rales or ronchi noted.  Breast: Symmetrical, no discrete masses or lumps noted in  either breast. Unable to express discharge from the nipples. Pain with palpation in the left armpit at the border of the pectoralis major. Neurological: Alert and oriented.  Psychiatric: Mood and affect normal.   BMET    Component Value Date/Time   NA 140 03/05/2014 1140   K 3.5 03/05/2014 1140   CL 102 03/05/2014 1140   CO2 28 03/05/2014 1140   GLUCOSE 95 03/05/2014 1140   BUN 12 03/05/2014 1140   CREATININE 0.7 03/05/2014 1140   CALCIUM 9.5 03/05/2014 1140   GFRNONAA 112.45 09/14/2009 0848    Lipid Panel     Component Value Date/Time   CHOL 158 03/05/2014 1140   TRIG 80.0 03/05/2014 1140   HDL 51.60 03/05/2014 1140   CHOLHDL 3 03/05/2014 1140   VLDL 16.0 03/05/2014 1140   LDLCALC 90 03/05/2014 1140    CBC    Component Value Date/Time   WBC 4.2 03/05/2014 1140   RBC 4.22 03/05/2014 1140   HGB 12.3 03/05/2014 1140   HCT 37.8 03/05/2014 1140   PLT 281.0 03/05/2014 1140   MCV 89.4 03/05/2014 1140   MCHC 32.5 03/05/2014 1140   RDW 15.0 03/05/2014 1140   LYMPHSABS 1.4 03/05/2014 1140   MONOABS 0.4 03/05/2014 1140   EOSABS 0.2 03/05/2014 1140   BASOSABS 0.0 03/05/2014 1140    Hgb A1C No results found for: HGBA1C      Assessment & Plan:   Left breast pain extending into the left armpit:  Exam benign-MSK in origin She seems very concerned about this Will order digital mammogram and ultrasound of left breast-will notify you of the results Please see Shirlee LimerickMarion on your way out to schedule  Follow up with PCP if pain persist or worsens

## 2014-09-03 NOTE — Progress Notes (Signed)
   Subjective:    Patient ID: Jessica Shelton, female    DOB: February 11, 1961, 54 y.o.   MRN: [id number]  HPI Jessica Shelton is a 54 year old female who presents today with chief complaint of knot at her left axilla.  She noticed it 2 months ago and it feels painful when she lifts her arm above her head and when she touches it.  No fevers or chills.  She has not taken anything OTC for it.  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    Review of Systems  Constitutional: Negative for fever, chills and fatigue.  HENT: Negative.   Respiratory: Negative for cough, shortness of breath and wheezing.   Cardiovascular: Negative for chest pain, palpitations and leg swelling.  Gastrointestinal: Negative.   Musculoskeletal: Negative for back pain, gait problem and neck pain.  Skin: Negative for color change, pallor, rash and wound.  Neurological: Negative for dizziness, light-headedness and headaches.  Psychiatric/Behavioral: Negative.    Past Medical History  Diagnosis Date  . GERD (gastroesophageal reflux disease)   . Pain syndrome, chronic     complex region   History reviewed. No pertinent family history. Current Outpatient Prescriptions on File Prior to Visit  Medication Sig Dispense Refill  . albuterol (PROVENTIL,VENTOLIN) 90 MCG/ACT inhaler Inhale 2 puffs into the lungs every 6 (six) hours as needed.      . hydrochlorothiazide (HYDRODIURIL) 25 MG tablet TAKE 1/2 TO 1 TABLET BY MOUTH ONCE A DAYAS NEEDED FOR SWELLING 90 tablet 1  . HYDROcodone-acetaminophen (NORCO/VICODIN) 5-325 MG per tablet TAKE 1 TO 2 TABLETS BY MOUTH EVERY SIX HOURS AS NEEDED FOR PAIN 180 tablet 0  . Lidocaine (LIDODERM EX) Apply thinnly to tender areas on left shoulder 2-3 times daily     . lidocaine (LIDODERM) 5 % 1-2 patches on effected area, leave on for no longer than 12 hours     . tiZANidine (ZANAFLEX) 2 MG tablet TAKE 1 TABLET BY MOUTH TWICE A DAY 180 tablet 0  . traMADol  (ULTRAM) 50 MG tablet TAKE 1 TO 2 TABLETS BY MOUTH EVERY 6 HOURS AS NEEDED FOR PAIN 270 tablet 0   No current facility-administered medications on file prior to visit.       Objective:   Physical Exam  Constitutional: She is oriented to person, place, and time. She appears well-developed and well-nourished.  HENT:  Head: Normocephalic and atraumatic.  Neck: Normal range of motion. Neck supple.  Cardiovascular: Normal rate, regular rhythm and normal heart sounds.   No murmur heard. Pulmonary/Chest: Effort normal and breath sounds normal.  There is an area of dense tissue in the tail of spence of the breast.  No palpable mass.  Patient is tender to touch here.  She has had multiple surgeries on the chest wall as well. She does state that she has her pectoral muscles.   Lymphadenopathy:    She has no cervical adenopathy.  Neurological: She is alert and oriented to person, place, and time.    BP 114/80 mmHg  Pulse 82  Temp(Src) 97.9 F (36.6 C) (Oral)  Wt 153 lb (69.4 kg)  SpO2 98%       Assessment & Plan:  1. Left breast pain extending into armpit Offered patient mammogram and ultrasound.  She would like to set this up. She can follow up with office as needed.

## 2014-09-03 NOTE — Patient Instructions (Signed)

## 2014-09-03 NOTE — Progress Notes (Signed)
Pre visit review using our clinic review tool, if applicable. No additional management support is needed unless otherwise documented below in the visit note. 

## 2014-09-07 ENCOUNTER — Ambulatory Visit
Admission: RE | Admit: 2014-09-07 | Discharge: 2014-09-07 | Disposition: A | Discharge status: Home / Self Care | Payer: Commercial Managed Care - HMO | Source: Ambulatory Visit | Attending: Internal Medicine | Admitting: Internal Medicine

## 2014-09-07 DIAGNOSIS — M79622 Pain in left upper arm: Secondary | ICD-10-CM

## 2014-09-07 DIAGNOSIS — N644 Mastodynia: Secondary | ICD-10-CM

## 2014-09-16 ENCOUNTER — Encounter: Payer: Self-pay | Admitting: Family Medicine

## 2014-09-28 ENCOUNTER — Other Ambulatory Visit: Payer: Self-pay | Admitting: Internal Medicine

## 2014-09-28 NOTE — Telephone Encounter (Signed)
Last filled 08/24/14 by regina--please advise

## 2014-09-29 NOTE — Telephone Encounter (Signed)
Lm on pts vm informing her Rx is available for pickup 

## 2014-10-26 ENCOUNTER — Other Ambulatory Visit: Payer: Self-pay | Admitting: Family Medicine

## 2014-10-26 NOTE — Telephone Encounter (Signed)
Last f/u appt 02/2014-CPE 

## 2014-10-27 NOTE — Telephone Encounter (Signed)
Spoke to pt and informed her Rx is available for pickup from the front desk 

## 2014-11-27 ENCOUNTER — Other Ambulatory Visit: Payer: Self-pay | Admitting: Family Medicine

## 2014-11-30 NOTE — Telephone Encounter (Signed)
Last f/u appt 02/2014-CPE 

## 2014-11-30 NOTE — Telephone Encounter (Signed)
Spoke to pt and informed her Rx is available for pickup from the front desk 

## 2014-12-09 DIAGNOSIS — M25572 Pain in left ankle and joints of left foot: Secondary | ICD-10-CM | POA: Diagnosis not present

## 2014-12-09 DIAGNOSIS — Z9889 Other specified postprocedural states: Secondary | ICD-10-CM | POA: Diagnosis not present

## 2014-12-09 DIAGNOSIS — M25512 Pain in left shoulder: Secondary | ICD-10-CM | POA: Diagnosis not present

## 2014-12-09 DIAGNOSIS — M19072 Primary osteoarthritis, left ankle and foot: Secondary | ICD-10-CM | POA: Diagnosis not present

## 2014-12-09 DIAGNOSIS — G90512 Complex regional pain syndrome I of left upper limb: Secondary | ICD-10-CM | POA: Diagnosis not present

## 2014-12-09 DIAGNOSIS — M2142 Flat foot [pes planus] (acquired), left foot: Secondary | ICD-10-CM | POA: Diagnosis not present

## 2014-12-09 DIAGNOSIS — M898X1 Other specified disorders of bone, shoulder: Secondary | ICD-10-CM | POA: Insufficient documentation

## 2014-12-09 DIAGNOSIS — M960 Pseudarthrosis after fusion or arthrodesis: Secondary | ICD-10-CM | POA: Diagnosis not present

## 2014-12-09 DIAGNOSIS — M7732 Calcaneal spur, left foot: Secondary | ICD-10-CM | POA: Diagnosis not present

## 2014-12-09 DIAGNOSIS — T8484XD Pain due to internal orthopedic prosthetic devices, implants and grafts, subsequent encounter: Secondary | ICD-10-CM | POA: Diagnosis not present

## 2014-12-09 DIAGNOSIS — Z79891 Long term (current) use of opiate analgesic: Secondary | ICD-10-CM | POA: Diagnosis not present

## 2014-12-17 DIAGNOSIS — M960 Pseudarthrosis after fusion or arthrodesis: Secondary | ICD-10-CM | POA: Diagnosis not present

## 2014-12-17 DIAGNOSIS — M25572 Pain in left ankle and joints of left foot: Secondary | ICD-10-CM | POA: Diagnosis not present

## 2014-12-25 DIAGNOSIS — R222 Localized swelling, mass and lump, trunk: Secondary | ICD-10-CM | POA: Diagnosis not present

## 2014-12-28 ENCOUNTER — Other Ambulatory Visit: Payer: Self-pay | Admitting: Family Medicine

## 2014-12-28 MED ORDER — HYDROCODONE-ACETAMINOPHEN 5-325 MG PO TABS: ORAL_TABLET | ORAL | Status: DC

## 2014-12-28 NOTE — Telephone Encounter (Signed)
Rx placed in box and ready for pick up and Tramadol called into the pharmacy. Patient has been notified.

## 2014-12-28 NOTE — Telephone Encounter (Signed)
See allergy contraindication. Call when script ready for pick up.

## 2014-12-28 NOTE — Telephone Encounter (Signed)
Last office visit 09/03/14 - Acute. Tramadol refilled 11/02/14 for #270. Hydrocodone refilled 11/30/14 #180 Is it okay to refill these two prescriptions?

## 2014-12-28 NOTE — Addendum Note (Signed)
Addended byCleda Clarks: RIPLEY, Mara Favero B on: 12/28/2014 02:30 PM   Modules accepted: Orders

## 2014-12-28 NOTE — Telephone Encounter (Signed)
Script is on your desk for signature if okay to refill. Previous script showed call in, and has to be picked up.

## 2014-12-28 NOTE — Addendum Note (Signed)
Addended byCleda Clarks: RIPLEY, Tashya Alberty B on: 12/28/2014 11:41 AM   Modules accepted: Orders

## 2015-01-11 DIAGNOSIS — G8929 Other chronic pain: Secondary | ICD-10-CM | POA: Diagnosis not present

## 2015-01-11 DIAGNOSIS — M79642 Pain in left hand: Secondary | ICD-10-CM | POA: Diagnosis not present

## 2015-01-11 DIAGNOSIS — M25532 Pain in left wrist: Secondary | ICD-10-CM | POA: Diagnosis not present

## 2015-01-11 DIAGNOSIS — T8484XD Pain due to internal orthopedic prosthetic devices, implants and grafts, subsequent encounter: Secondary | ICD-10-CM | POA: Diagnosis not present

## 2015-01-11 DIAGNOSIS — G540 Brachial plexus disorders: Secondary | ICD-10-CM | POA: Diagnosis not present

## 2015-01-11 DIAGNOSIS — Z981 Arthrodesis status: Secondary | ICD-10-CM | POA: Diagnosis not present

## 2015-01-11 DIAGNOSIS — T8484XA Pain due to internal orthopedic prosthetic devices, implants and grafts, initial encounter: Secondary | ICD-10-CM | POA: Diagnosis not present

## 2015-01-11 DIAGNOSIS — M25572 Pain in left ankle and joints of left foot: Secondary | ICD-10-CM | POA: Diagnosis not present

## 2015-01-27 ENCOUNTER — Other Ambulatory Visit: Payer: Self-pay | Admitting: Family Medicine

## 2015-01-28 DIAGNOSIS — M898X1 Other specified disorders of bone, shoulder: Secondary | ICD-10-CM | POA: Diagnosis not present

## 2015-01-28 NOTE — Telephone Encounter (Signed)
Dr. Dayton Martes last note reviewed. RX printed and signed and given to [facility]. She probably needs follow up appt by 02/2015

## 2015-01-28 NOTE — Telephone Encounter (Signed)
Spoke to pt and informed her Rx is available for pickup from the front desk. Pt advised CPE or f/u required for any additional refills

## 2015-01-28 NOTE — Telephone Encounter (Signed)
Last f/u appt 01/2014 

## 2015-02-09 DIAGNOSIS — M205X2 Other deformities of toe(s) (acquired), left foot: Secondary | ICD-10-CM | POA: Diagnosis not present

## 2015-02-09 DIAGNOSIS — M19072 Primary osteoarthritis, left ankle and foot: Secondary | ICD-10-CM | POA: Diagnosis not present

## 2015-02-09 DIAGNOSIS — M2142 Flat foot [pes planus] (acquired), left foot: Secondary | ICD-10-CM | POA: Diagnosis not present

## 2015-02-09 DIAGNOSIS — M24172 Other articular cartilage disorders, left ankle: Secondary | ICD-10-CM | POA: Diagnosis not present

## 2015-02-09 DIAGNOSIS — M25472 Effusion, left ankle: Secondary | ICD-10-CM | POA: Diagnosis not present

## 2015-02-09 DIAGNOSIS — M24175 Other articular cartilage disorders, left foot: Secondary | ICD-10-CM | POA: Diagnosis not present

## 2015-02-17 ENCOUNTER — Other Ambulatory Visit: Payer: Self-pay

## 2015-02-17 DIAGNOSIS — Z1231 Encounter for screening mammogram for malignant neoplasm of breast: Secondary | ICD-10-CM

## 2015-02-24 ENCOUNTER — Other Ambulatory Visit: Payer: Self-pay | Admitting: Family Medicine

## 2015-02-24 ENCOUNTER — Other Ambulatory Visit: Payer: Self-pay | Admitting: Internal Medicine

## 2015-02-24 NOTE — Telephone Encounter (Signed)
Spoke to pt and informed her Rx is available for pickup from the front desk 

## 2015-02-24 NOTE — Telephone Encounter (Signed)
Last f/u 03/12/2014-CPE

## 2015-02-24 NOTE — Telephone Encounter (Signed)
Last filled 01/28/15 by Regina--has appt scheduled for 03/16/15 with you--please advise

## 2015-02-24 NOTE — Telephone Encounter (Signed)
Rx called in to requested pharmacy 

## 2015-03-04 ENCOUNTER — Other Ambulatory Visit: Payer: Self-pay | Admitting: Family Medicine

## 2015-03-04 DIAGNOSIS — Z0000 Encounter for general adult medical examination without abnormal findings: Secondary | ICD-10-CM

## 2015-03-09 ENCOUNTER — Encounter: Payer: Self-pay | Admitting: Radiology

## 2015-03-09 ENCOUNTER — Other Ambulatory Visit (INDEPENDENT_AMBULATORY_CARE_PROVIDER_SITE_OTHER): Payer: Commercial Managed Care - HMO

## 2015-03-09 DIAGNOSIS — Z0000 Encounter for general adult medical examination without abnormal findings: Secondary | ICD-10-CM | POA: Diagnosis not present

## 2015-03-09 DIAGNOSIS — Z79891 Long term (current) use of opiate analgesic: Secondary | ICD-10-CM | POA: Diagnosis not present

## 2015-03-09 LAB — CBC WITH DIFFERENTIAL/PLATELET
BASOS ABS: 0 10*3/uL (ref 0.0–0.1)
BASOS PCT: 0.8 % (ref 0.0–3.0)
EOS ABS: 0.2 10*3/uL (ref 0.0–0.7)
Eosinophils Relative: 3.1 % (ref 0.0–5.0)
HEMATOCRIT: 34.2 % — AB (ref 36.0–46.0)
Hemoglobin: 11.2 g/dL — ABNORMAL LOW (ref 12.0–15.0)
LYMPHS ABS: 1.3 10*3/uL (ref 0.7–4.0)
LYMPHS PCT: 24 % (ref 12.0–46.0)
MCHC: 32.7 g/dL (ref 30.0–36.0)
MCV: 88 fl (ref 78.0–100.0)
MONO ABS: 0.5 10*3/uL (ref 0.1–1.0)
Monocytes Relative: 8.9 % (ref 3.0–12.0)
NEUTROS ABS: 3.6 10*3/uL (ref 1.4–7.7)
NEUTROS PCT: 63.2 % (ref 43.0–77.0)
PLATELETS: 271 10*3/uL (ref 150.0–400.0)
RBC: 3.88 Mil/uL (ref 3.87–5.11)
RDW: 15.8 % — AB (ref 11.5–15.5)
WBC: 5.6 10*3/uL (ref 4.0–10.5)

## 2015-03-09 LAB — COMPREHENSIVE METABOLIC PANEL
ALT: 9 U/L (ref 0–35)
AST: 9 U/L (ref 0–37)
Albumin: 4 g/dL (ref 3.5–5.2)
Alkaline Phosphatase: 65 U/L (ref 39–117)
BUN: 12 mg/dL (ref 6–23)
CALCIUM: 9.4 mg/dL (ref 8.4–10.5)
CHLORIDE: 103 meq/L (ref 96–112)
CO2: 30 meq/L (ref 19–32)
CREATININE: 0.68 mg/dL (ref 0.40–1.20)
GFR: 95.29 mL/min (ref >60.00)
GLUCOSE: 90 mg/dL (ref 70–99)
Potassium: 4.1 mEq/L (ref 3.5–5.1)
Sodium: 139 mEq/L (ref 135–145)
Total Bilirubin: 0.4 mg/dL (ref 0.2–1.2)
Total Protein: 7.1 g/dL (ref 6.0–8.3)

## 2015-03-09 LAB — LIPID PANEL
CHOL/HDL RATIO: 3
Cholesterol: 158 mg/dL (ref 0–200)
HDL: 55.3 mg/dL (ref >39.00)
LDL CALC: 85 mg/dL (ref 0–99)
NONHDL: 102.61
TRIGLYCERIDES: 87 mg/dL (ref 0.0–149.0)
VLDL: 17.4 mg/dL (ref 0.0–40.0)

## 2015-03-09 LAB — TSH: TSH: 2.37 u[IU]/mL (ref 0.35–4.50)

## 2015-03-10 LAB — HEPATITIS C ANTIBODY: HCV Ab: NEGATIVE

## 2015-03-15 ENCOUNTER — Ambulatory Visit: Payer: Commercial Managed Care - HMO

## 2015-03-16 ENCOUNTER — Encounter: Payer: Self-pay | Admitting: Family Medicine

## 2015-03-16 ENCOUNTER — Ambulatory Visit (INDEPENDENT_AMBULATORY_CARE_PROVIDER_SITE_OTHER): Payer: Commercial Managed Care - HMO | Admitting: Family Medicine

## 2015-03-16 VITALS — BP 132/72 | HR 60 | Temp 97.6°F | Ht 64.25 in | Wt 171.2 lb

## 2015-03-16 DIAGNOSIS — Q690 Accessory finger(s): Secondary | ICD-10-CM

## 2015-03-16 DIAGNOSIS — G9050 Complex regional pain syndrome I, unspecified: Secondary | ICD-10-CM

## 2015-03-16 DIAGNOSIS — R928 Other abnormal and inconclusive findings on diagnostic imaging of breast: Secondary | ICD-10-CM | POA: Diagnosis not present

## 2015-03-16 DIAGNOSIS — Z0000 Encounter for general adult medical examination without abnormal findings: Secondary | ICD-10-CM | POA: Insufficient documentation

## 2015-03-16 MED ORDER — HYDROCHLOROTHIAZIDE 25 MG PO TABS: ORAL_TABLET | ORAL | Status: DC

## 2015-03-16 MED ORDER — HYDROCHLOROTHIAZIDE 25 MG PO TABS
ORAL_TABLET | ORAL | Status: DC
Start: 2015-03-16 — End: 2017-04-27

## 2015-03-16 NOTE — Assessment & Plan Note (Signed)
Pt calling to schedule mammogram today.

## 2015-03-16 NOTE — Patient Instructions (Signed)
Check with your insurance to see if they will cover the shingles shot.  It was great to see you.

## 2015-03-16 NOTE — Progress Notes (Signed)
Pre visit review using our clinic review tool, if applicable. No additional management support is needed unless otherwise documented below in the visit note. 

## 2015-03-16 NOTE — Assessment & Plan Note (Addendum)
The patients weight, height, BMI and visual acuity have been recorded in the chart.  Cognitive function assessed.   I have made referrals, counseling and provided education to the patient based review of the above and I have provided the pt with a written personalized care plan for preventive services.  Influenza vaccine declined.

## 2015-03-16 NOTE — Progress Notes (Signed)
54 yo pleasant female with complex medical history for annual medicare wellness visit and follow up of chronic medical conditions.  I have personally reviewed the Medicare Annual Wellness questionnaire and have noted 1. The patient's medical and social history 2. Their use of alcohol, tobacco or illicit drugs 3. Their current medications and supplements 4. The patient's functional ability including ADL's, fall risks, home safety risks and hearing or visual             impairment. 5. Diet and physical activities 6. Evidence for depression or mood disorders  End of life wishes discussed and updated in Social History.  The roster of all physicians providing medical care to patient - is listed in the CareTeams section of the chart.   Pain syndrome- very complex.  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  Tries to stay active but in pain all the time. Take Vicodin 5-500 four times daily, Tramadol 100 mg every 6 hours, Flexeril and Tizanidine 2 mg two times a day as needed.  Also has a Lidoderm patch.  S/p right ulnar nerve root surgery in 06/2014 and subsequent cellulitis- admitted to [facility] in 07/2014- notes reviewed. Having right foot surgery in 04/2015.  Well woman- due for colonoscopy but refusing.   Refusing IFOB as well .  Denies any changes in her bowel habits or blood in her stool.  Mammogram 09/07/14- advised follow up mammogram 02/2015- she is calling to schedule this today.  UTD pap smear- last done by me 03/13/15.  No h/o abnormal pap smears.  Lab Results  Component Value Date   CHOL 158 03/09/2015   HDL 55.30 03/09/2015   LDLCALC 85 03/09/2015   LDLDIRECT 138.8 02/25/2013   TRIG 87.0 03/09/2015   CHOLHDL 3 03/09/2015    Lab Results  Component Value Date   NA 139 03/09/2015   K 4.1 03/09/2015   CL 103 03/09/2015   CO2 30 03/09/2015    Lab Results  Component  Value Date   CREATININE 0.68 03/09/2015   Lab Results  Component Value Date   TSH 2.37 03/09/2015   Lab Results  Component Value Date   WBC 5.6 03/09/2015   HGB 11.2* 03/09/2015   HCT 34.2* 03/09/2015   MCV 88.0 03/09/2015   PLT 271.0 03/09/2015    Patient Active Problem List   Diagnosis Date Noted  . Medicare annual wellness visit, subsequent 03/16/2015  . Encounter for routine gynecological examination 03/12/2014  . UNSPECIFIED DISORDER OF KIDNEY AND URETER 03/21/2010  . MAMMOGRAM, ABNORMAL 05/19/2008  . VITAMIN D DEFICIENCY 12/25/2007  . ANEMIA 12/25/2007  . REFLEX SYMPATHETIC DYSTROPHY 04/15/2007  . OSTEOARTHRITIS, SPINE 04/15/2007  . VENEREAL WART 04/09/2007  . PAIN, CHRONIC NEC 04/09/2007  . CARPAL TUNNEL SYNDROME 04/09/2007  . STRICTURE, ESOPHAGEAL 04/09/2007  . POLYDACTYLY, HANDS 04/09/2007   Past Medical History  Diagnosis Date  . GERD (gastroesophageal reflux disease)   . Pain syndrome, chronic     complex region   Past Surgical History  Procedure Laterality Date  . Carpal tunnel release  01/2006  . Rotator cuff repair  08/2000  . Thumb surgery      pollicized left  . Hand surgery      left   Social History  Substance Use Topics  . Smoking status: Never Smoker   . Smokeless tobacco: None  . Alcohol Use: No   History reviewed. No pertinent family history. Allergies  Allergen Reactions  . Hydromorphone Hcl Anaphylaxis and Itching  . Latex   . Clonidine Hydrochloride   . Codeine     REACTION: anaphylaxis  . Dilaudid [Hydromorphone Hcl] Itching and Dermatitis  . Morphine     REACTION: anaphylaxis  . Oxycodone-Acetaminophen     REACTION: anaphylaxis  . Prednisone     REACTION: difficulty breathing  . Sulfamethoxazole-Trimethoprim     REACTION: rash  . Sulfonamide Derivatives     REACTION: urticaria (hives)   Current Outpatient Prescriptions on File Prior to Visit  Medication Sig Dispense Refill  . albuterol (PROVENTIL,VENTOLIN) 90  MCG/ACT inhaler Inhale 2 puffs into the lungs every 6 (six) hours as needed.      . hydrochlorothiazide (HYDRODIURIL) 25 MG tablet TAKE 1/2 TO 1 TABLET BY MOUTH ONCE A DAYAS NEEDED FOR SWELLING 90 tablet 1  . HYDROcodone-acetaminophen (NORCO/VICODIN) 5-325 MG per tablet TAKE ONE OR TWO TABLETS BY MOUTH EVERY 6HOURS AS NEEDED FOR PAIN 180 tablet 0  . Lidocaine (LIDODERM EX) Apply thinnly to tender areas on left shoulder 2-3 times daily     . lidocaine (LIDODERM) 5 % 1-2 patches on effected area, leave on for no longer than 12 hours     . tiZANidine (ZANAFLEX) 2 MG tablet TAKE 1 TABLET BY MOUTH TWICE A DAY 180 tablet 0  . traMADol (ULTRAM) 50 MG tablet TAKE ONE OR TWO TABLETS BY MOUTH EVERY 6HOURS AS NEEDED 270 tablet 0   No current facility-administered medications on file prior to visit.     The PMH, PSH, Social History, Family History, Medications, and allergies have been reviewed in [facility], and have been updated if relevant.   Review of Systems  Constitutional: Negative.   HENT: Negative.   Eyes: Negative.   Respiratory: Negative.   Cardiovascular: Negative.   Gastrointestinal: Negative.   Genitourinary: Negative.   Musculoskeletal: Positive for myalgias, gait problem, neck pain and neck stiffness.  Skin: Negative.   Allergic/Immunologic: Negative.   Neurological: Negative.   Hematological: Negative.   Psychiatric/Behavioral: Negative.   All other systems reviewed and are negative.   Physical Exam BP 132/72 mmHg  Pulse 60  Temp(Src) 97.6 F (36.4 C) (Oral)  Ht 5' 4.25" (1.632 m)  Wt 171 lb 4 oz (77.678 kg)  BMI 29.16 kg/m2  SpO2 98% Wt Readings from Last 3 Encounters:  03/16/15 171 lb 4 oz (77.678 kg)  09/03/14 153 lb (69.4 kg)  03/12/14 153 lb 12 oz (69.741 kg)    General:  Well-developed,well-nourished,in no acute distress; alert,appropriate and cooperative throughout examination Head:  normocephalic and atraumatic.   Eyes:  vision grossly intact, pupils equal,  pupils round, and pupils reactive to light.   Ears:  R ear normal and L ear normal.   Nose:  no external deformity.   Mouth:  good dentition.   Neck:  No deformities, masses, or tenderness noted. Breasts:  No mass, nodules, thickening, tenderness, bulging, retraction, inflamation, nipple discharge or skin changes noted.   Lungs:  Normal respiratory effort, chest expands symmetrically. Lungs are clear to auscultation, no crackles or wheezes. Heart:  Normal rate and regular rhythm. S1 and S2 normal without gallop, murmur, click, rub or other extra sounds. Abdomen:  Bowel sounds positive,abdomen soft and non-tender without masses, organomegaly or hernias noted. Msk:  No deformity or scoliosis noted of thoracic or lumbar spine.   Extremities:  No clubbing, cyanosis, edema, or deformity noted with normal full range of motion of all joints.   polydacytly Neurologic:  alert & oriented X3 and gait normal.   Skin:  Intact without suspicious lesions or rashes Cervical Nodes:  No lymphadenopathy noted Axillary Nodes:  No palpable lymphadenopathy Psych:  Cognition and judgment appear intact. Alert and cooperative with normal attention span and concentration. No apparent delusions, illusions, hallucinations

## 2015-03-26 ENCOUNTER — Other Ambulatory Visit: Payer: Self-pay | Admitting: Family Medicine

## 2015-03-29 ENCOUNTER — Encounter: Payer: Self-pay | Admitting: Family Medicine

## 2015-03-29 NOTE — Telephone Encounter (Signed)
Last f/u 02/2015-CPE 

## 2015-03-29 NOTE — Telephone Encounter (Signed)
Spoke to pt and informed her Rx is available for pickup from the front desk 

## 2015-04-26 ENCOUNTER — Other Ambulatory Visit: Payer: Self-pay | Admitting: Family Medicine

## 2015-04-26 NOTE — Telephone Encounter (Signed)
Last f/u 02/2015-CPE 

## 2015-04-26 NOTE — Telephone Encounter (Signed)
Spoke to pt and informed her Rx is available for pickup from the front desk 

## 2015-05-17 ENCOUNTER — Other Ambulatory Visit: Payer: Self-pay | Admitting: Family Medicine

## 2015-05-17 NOTE — Telephone Encounter (Signed)
Rx called in to requested pharmacy 

## 2015-05-17 NOTE — Telephone Encounter (Signed)
Last f/u 02/2015 

## 2015-05-24 ENCOUNTER — Other Ambulatory Visit: Payer: Self-pay | Admitting: Family Medicine

## 2015-05-24 NOTE — Telephone Encounter (Signed)
Spoke to pt and informed her Rx is available for pickup from the front desk 

## 2015-05-24 NOTE — Telephone Encounter (Signed)
Last f/u 02/2015-CPE 

## 2015-06-03 DIAGNOSIS — Z789 Other specified health status: Secondary | ICD-10-CM | POA: Insufficient documentation

## 2015-06-03 DIAGNOSIS — M21862 Other specified acquired deformities of left lower leg: Secondary | ICD-10-CM | POA: Insufficient documentation

## 2015-06-03 DIAGNOSIS — M216X2 Other acquired deformities of left foot: Secondary | ICD-10-CM | POA: Diagnosis not present

## 2015-06-18 DIAGNOSIS — Q669 Congenital deformity of feet, unspecified: Secondary | ICD-10-CM | POA: Diagnosis not present

## 2015-06-18 DIAGNOSIS — M21962 Unspecified acquired deformity of left lower leg: Secondary | ICD-10-CM | POA: Diagnosis not present

## 2015-06-18 DIAGNOSIS — M24572 Contracture, left ankle: Secondary | ICD-10-CM | POA: Diagnosis not present

## 2015-06-18 DIAGNOSIS — M216X2 Other acquired deformities of left foot: Secondary | ICD-10-CM | POA: Diagnosis not present

## 2015-06-18 DIAGNOSIS — M2142 Flat foot [pes planus] (acquired), left foot: Secondary | ICD-10-CM | POA: Diagnosis not present

## 2015-06-18 DIAGNOSIS — G8918 Other acute postprocedural pain: Secondary | ICD-10-CM | POA: Diagnosis not present

## 2015-06-18 HISTORY — PX: FOOT SURGERY: SHX648

## 2015-06-19 DIAGNOSIS — M216X2 Other acquired deformities of left foot: Secondary | ICD-10-CM | POA: Diagnosis not present

## 2015-06-19 DIAGNOSIS — Q669 Congenital deformity of feet, unspecified: Secondary | ICD-10-CM | POA: Diagnosis not present

## 2015-06-19 DIAGNOSIS — M2142 Flat foot [pes planus] (acquired), left foot: Secondary | ICD-10-CM | POA: Diagnosis not present

## 2015-06-19 DIAGNOSIS — G8918 Other acute postprocedural pain: Secondary | ICD-10-CM | POA: Diagnosis not present

## 2015-06-19 DIAGNOSIS — M24572 Contracture, left ankle: Secondary | ICD-10-CM | POA: Diagnosis not present

## 2015-06-21 ENCOUNTER — Other Ambulatory Visit: Payer: Self-pay | Admitting: Family Medicine

## 2015-06-22 NOTE — Telephone Encounter (Signed)
Last f/u 02/2015 

## 2015-06-22 NOTE — Telephone Encounter (Signed)
Rx called in to requested pharmacy 

## 2015-06-25 ENCOUNTER — Other Ambulatory Visit: Payer: Self-pay | Admitting: Family Medicine

## 2015-07-06 ENCOUNTER — Other Ambulatory Visit: Payer: Self-pay | Admitting: Family Medicine

## 2015-07-07 NOTE — Telephone Encounter (Signed)
Spoke to pt and informed her Rx is available for pickup from the front desk. Pt states her husband, Nedra Hai will be picking up Rx as she recently had surgery

## 2015-07-07 NOTE — Telephone Encounter (Signed)
Last f/u 02/2015 

## 2015-07-12 DIAGNOSIS — Z4789 Encounter for other orthopedic aftercare: Secondary | ICD-10-CM | POA: Diagnosis not present

## 2015-07-22 ENCOUNTER — Other Ambulatory Visit: Payer: Self-pay | Admitting: Family Medicine

## 2015-07-22 NOTE — Telephone Encounter (Signed)
Rx called in to requested pharmacy 

## 2015-07-22 NOTE — Telephone Encounter (Signed)
Last f/u 02/2015-CPE 

## 2015-07-29 DIAGNOSIS — M19072 Primary osteoarthritis, left ankle and foot: Secondary | ICD-10-CM | POA: Diagnosis not present

## 2015-07-29 DIAGNOSIS — M216X2 Other acquired deformities of left foot: Secondary | ICD-10-CM | POA: Diagnosis not present

## 2015-07-29 DIAGNOSIS — Z4802 Encounter for removal of sutures: Secondary | ICD-10-CM | POA: Diagnosis not present

## 2015-07-29 DIAGNOSIS — Z9889 Other specified postprocedural states: Secondary | ICD-10-CM | POA: Diagnosis not present

## 2015-08-12 ENCOUNTER — Other Ambulatory Visit: Payer: Self-pay | Admitting: Family Medicine

## 2015-08-12 NOTE — Telephone Encounter (Signed)
Spoke to pt and informed her Rx is available for pickup from the front desk 

## 2015-08-12 NOTE — Telephone Encounter (Signed)
Last f/u 02/2015-CPE 

## 2015-08-23 ENCOUNTER — Other Ambulatory Visit: Payer: Self-pay | Admitting: Family Medicine

## 2015-08-23 DIAGNOSIS — Z7982 Long term (current) use of aspirin: Secondary | ICD-10-CM | POA: Diagnosis not present

## 2015-08-23 DIAGNOSIS — T814XXA Infection following a procedure, initial encounter: Secondary | ICD-10-CM | POA: Diagnosis not present

## 2015-08-23 DIAGNOSIS — Z4789 Encounter for other orthopedic aftercare: Secondary | ICD-10-CM | POA: Diagnosis not present

## 2015-08-23 NOTE — Telephone Encounter (Signed)
Last f/u 02/2015-CPE 

## 2015-08-23 NOTE — Telephone Encounter (Signed)
Rx called in to requested pharmacy 

## 2015-09-05 ENCOUNTER — Other Ambulatory Visit: Payer: Self-pay | Admitting: Family Medicine

## 2015-09-07 NOTE — Telephone Encounter (Signed)
Last f/u 02/2015-CPE 

## 2015-09-07 NOTE — Telephone Encounter (Signed)
Spoke to pt and informed her Rx is available for pickup from the front desk. Pt advised third party unable to pickup 

## 2015-09-13 DIAGNOSIS — Z4789 Encounter for other orthopedic aftercare: Secondary | ICD-10-CM | POA: Diagnosis not present

## 2015-09-13 DIAGNOSIS — Z981 Arthrodesis status: Secondary | ICD-10-CM | POA: Diagnosis not present

## 2015-09-13 DIAGNOSIS — M216X2 Other acquired deformities of left foot: Secondary | ICD-10-CM | POA: Diagnosis not present

## 2015-09-16 ENCOUNTER — Other Ambulatory Visit: Payer: Self-pay | Admitting: Family Medicine

## 2015-09-16 NOTE — Telephone Encounter (Signed)
Last f/u 02/2015-CPE 

## 2015-09-17 NOTE — Telephone Encounter (Signed)
Rx called in to requested pharmacy 

## 2015-10-04 DIAGNOSIS — Z885 Allergy status to narcotic agent status: Secondary | ICD-10-CM | POA: Diagnosis not present

## 2015-10-04 DIAGNOSIS — Z09 Encounter for follow-up examination after completed treatment for conditions other than malignant neoplasm: Secondary | ICD-10-CM | POA: Diagnosis not present

## 2015-10-04 DIAGNOSIS — Z882 Allergy status to sulfonamides status: Secondary | ICD-10-CM | POA: Diagnosis not present

## 2015-10-04 DIAGNOSIS — Z888 Allergy status to other drugs, medicaments and biological substances status: Secondary | ICD-10-CM | POA: Diagnosis not present

## 2015-10-04 DIAGNOSIS — J45909 Unspecified asthma, uncomplicated: Secondary | ICD-10-CM | POA: Diagnosis not present

## 2015-10-04 DIAGNOSIS — T814XXA Infection following a procedure, initial encounter: Secondary | ICD-10-CM | POA: Diagnosis not present

## 2015-10-05 ENCOUNTER — Other Ambulatory Visit: Payer: Self-pay | Admitting: Family Medicine

## 2015-10-05 NOTE — Telephone Encounter (Signed)
Last f/u 02/2015-CPE 

## 2015-10-05 NOTE — Telephone Encounter (Signed)
Spoke to pt and informed her Rx is available for pickup from the front desk. Pt advised third party unable to pickup 

## 2015-10-07 ENCOUNTER — Encounter: Payer: Self-pay | Admitting: Family Medicine

## 2015-10-07 DIAGNOSIS — Z79891 Long term (current) use of opiate analgesic: Secondary | ICD-10-CM | POA: Diagnosis not present

## 2015-10-13 DIAGNOSIS — I890 Lymphedema, not elsewhere classified: Secondary | ICD-10-CM | POA: Diagnosis not present

## 2015-10-13 DIAGNOSIS — L97523 Non-pressure chronic ulcer of other part of left foot with necrosis of muscle: Secondary | ICD-10-CM | POA: Diagnosis not present

## 2015-10-13 DIAGNOSIS — I10 Essential (primary) hypertension: Secondary | ICD-10-CM | POA: Diagnosis not present

## 2015-10-13 DIAGNOSIS — Z79899 Other long term (current) drug therapy: Secondary | ICD-10-CM | POA: Diagnosis not present

## 2015-10-13 DIAGNOSIS — Z981 Arthrodesis status: Secondary | ICD-10-CM | POA: Diagnosis not present

## 2015-10-13 DIAGNOSIS — M2142 Flat foot [pes planus] (acquired), left foot: Secondary | ICD-10-CM | POA: Diagnosis not present

## 2015-10-13 DIAGNOSIS — Z7982 Long term (current) use of aspirin: Secondary | ICD-10-CM | POA: Diagnosis not present

## 2015-10-13 DIAGNOSIS — Z9889 Other specified postprocedural states: Secondary | ICD-10-CM | POA: Diagnosis not present

## 2015-10-13 DIAGNOSIS — R002 Palpitations: Secondary | ICD-10-CM | POA: Diagnosis not present

## 2015-10-15 DIAGNOSIS — L97529 Non-pressure chronic ulcer of other part of left foot with unspecified severity: Secondary | ICD-10-CM | POA: Diagnosis not present

## 2015-10-15 DIAGNOSIS — I872 Venous insufficiency (chronic) (peripheral): Secondary | ICD-10-CM | POA: Diagnosis not present

## 2015-10-18 ENCOUNTER — Other Ambulatory Visit: Payer: Self-pay | Admitting: Family Medicine

## 2015-10-19 NOTE — Telephone Encounter (Signed)
Last f/u 02/2015-CPE 

## 2015-10-19 NOTE — Telephone Encounter (Signed)
Rx called in to requested pharmacy 

## 2015-10-20 DIAGNOSIS — I872 Venous insufficiency (chronic) (peripheral): Secondary | ICD-10-CM | POA: Insufficient documentation

## 2015-10-20 DIAGNOSIS — L97521 Non-pressure chronic ulcer of other part of left foot limited to breakdown of skin: Secondary | ICD-10-CM | POA: Diagnosis not present

## 2015-10-20 DIAGNOSIS — G90522 Complex regional pain syndrome I of left lower limb: Secondary | ICD-10-CM | POA: Diagnosis not present

## 2015-10-20 DIAGNOSIS — Z9889 Other specified postprocedural states: Secondary | ICD-10-CM | POA: Diagnosis not present

## 2015-10-20 DIAGNOSIS — L97523 Non-pressure chronic ulcer of other part of left foot with necrosis of muscle: Secondary | ICD-10-CM | POA: Diagnosis not present

## 2015-10-20 DIAGNOSIS — E46 Unspecified protein-calorie malnutrition: Secondary | ICD-10-CM | POA: Diagnosis not present

## 2015-10-21 DIAGNOSIS — T8189XA Other complications of procedures, not elsewhere classified, initial encounter: Secondary | ICD-10-CM | POA: Diagnosis not present

## 2015-10-25 DIAGNOSIS — J45909 Unspecified asthma, uncomplicated: Secondary | ICD-10-CM | POA: Diagnosis not present

## 2015-10-25 DIAGNOSIS — M2142 Flat foot [pes planus] (acquired), left foot: Secondary | ICD-10-CM | POA: Diagnosis not present

## 2015-10-25 DIAGNOSIS — L02612 Cutaneous abscess of left foot: Secondary | ICD-10-CM | POA: Diagnosis not present

## 2015-10-25 DIAGNOSIS — Z79899 Other long term (current) drug therapy: Secondary | ICD-10-CM | POA: Diagnosis not present

## 2015-10-25 DIAGNOSIS — Z882 Allergy status to sulfonamides status: Secondary | ICD-10-CM | POA: Diagnosis not present

## 2015-10-25 DIAGNOSIS — Z885 Allergy status to narcotic agent status: Secondary | ICD-10-CM | POA: Diagnosis not present

## 2015-10-25 DIAGNOSIS — Z888 Allergy status to other drugs, medicaments and biological substances status: Secondary | ICD-10-CM | POA: Diagnosis not present

## 2015-10-25 DIAGNOSIS — Z7982 Long term (current) use of aspirin: Secondary | ICD-10-CM | POA: Diagnosis not present

## 2015-10-29 DIAGNOSIS — M60074 Infective myositis, left foot: Secondary | ICD-10-CM | POA: Diagnosis not present

## 2015-10-29 DIAGNOSIS — G8918 Other acute postprocedural pain: Secondary | ICD-10-CM | POA: Diagnosis not present

## 2015-10-29 DIAGNOSIS — Z7982 Long term (current) use of aspirin: Secondary | ICD-10-CM | POA: Diagnosis not present

## 2015-10-29 DIAGNOSIS — J45909 Unspecified asthma, uncomplicated: Secondary | ICD-10-CM | POA: Diagnosis not present

## 2015-10-29 DIAGNOSIS — Z6827 Body mass index (BMI) 27.0-27.9, adult: Secondary | ICD-10-CM | POA: Diagnosis not present

## 2015-10-29 DIAGNOSIS — Z981 Arthrodesis status: Secondary | ICD-10-CM | POA: Diagnosis not present

## 2015-10-29 DIAGNOSIS — L02612 Cutaneous abscess of left foot: Secondary | ICD-10-CM | POA: Diagnosis not present

## 2015-10-29 DIAGNOSIS — E669 Obesity, unspecified: Secondary | ICD-10-CM | POA: Diagnosis not present

## 2015-11-07 ENCOUNTER — Other Ambulatory Visit: Payer: Self-pay | Admitting: Family Medicine

## 2015-11-09 NOTE — Telephone Encounter (Signed)
Spoke to pt and informed her Rx is available for pickup from the front desk 

## 2015-11-09 NOTE — Telephone Encounter (Signed)
Last f/u 02/2015-CPE 

## 2015-11-19 DIAGNOSIS — Z4802 Encounter for removal of sutures: Secondary | ICD-10-CM | POA: Diagnosis not present

## 2015-11-19 DIAGNOSIS — L02612 Cutaneous abscess of left foot: Secondary | ICD-10-CM | POA: Diagnosis not present

## 2015-12-06 ENCOUNTER — Other Ambulatory Visit: Payer: Self-pay | Admitting: Family Medicine

## 2015-12-06 NOTE — Telephone Encounter (Signed)
Last f/u 02/2015 

## 2015-12-07 NOTE — Telephone Encounter (Signed)
Spoke to pt and informed her Rx is available for pickup from the front desk 

## 2015-12-14 DIAGNOSIS — M19072 Primary osteoarthritis, left ankle and foot: Secondary | ICD-10-CM | POA: Diagnosis not present

## 2015-12-14 DIAGNOSIS — J45909 Unspecified asthma, uncomplicated: Secondary | ICD-10-CM | POA: Diagnosis not present

## 2015-12-14 DIAGNOSIS — M2142 Flat foot [pes planus] (acquired), left foot: Secondary | ICD-10-CM | POA: Diagnosis not present

## 2015-12-14 DIAGNOSIS — T85848A Pain due to other internal prosthetic devices, implants and grafts, initial encounter: Secondary | ICD-10-CM | POA: Insufficient documentation

## 2015-12-14 DIAGNOSIS — M85872 Other specified disorders of bone density and structure, left ankle and foot: Secondary | ICD-10-CM | POA: Diagnosis not present

## 2015-12-14 DIAGNOSIS — M21862 Other specified acquired deformities of left lower leg: Secondary | ICD-10-CM | POA: Diagnosis not present

## 2015-12-14 DIAGNOSIS — L97923 Non-pressure chronic ulcer of unspecified part of left lower leg with necrosis of muscle: Secondary | ICD-10-CM | POA: Diagnosis not present

## 2015-12-14 DIAGNOSIS — Z888 Allergy status to other drugs, medicaments and biological substances status: Secondary | ICD-10-CM | POA: Diagnosis not present

## 2015-12-14 DIAGNOSIS — Z4789 Encounter for other orthopedic aftercare: Secondary | ICD-10-CM | POA: Diagnosis not present

## 2015-12-14 DIAGNOSIS — Z885 Allergy status to narcotic agent status: Secondary | ICD-10-CM | POA: Diagnosis not present

## 2015-12-14 DIAGNOSIS — Z981 Arthrodesis status: Secondary | ICD-10-CM | POA: Diagnosis not present

## 2015-12-20 ENCOUNTER — Other Ambulatory Visit: Payer: Self-pay | Admitting: *Deleted

## 2015-12-20 MED ORDER — TRAMADOL HCL 50 MG PO TABS: ORAL_TABLET | ORAL | Status: DC

## 2015-12-20 NOTE — Telephone Encounter (Signed)
Last f/u 02/2015-CPE 

## 2015-12-20 NOTE — Telephone Encounter (Signed)
Rx called in to requested pharmacy 

## 2015-12-24 DIAGNOSIS — E669 Obesity, unspecified: Secondary | ICD-10-CM | POA: Diagnosis not present

## 2015-12-24 DIAGNOSIS — Z472 Encounter for removal of internal fixation device: Secondary | ICD-10-CM | POA: Diagnosis not present

## 2015-12-24 DIAGNOSIS — T8484XA Pain due to internal orthopedic prosthetic devices, implants and grafts, initial encounter: Secondary | ICD-10-CM | POA: Diagnosis not present

## 2015-12-24 DIAGNOSIS — M19072 Primary osteoarthritis, left ankle and foot: Secondary | ICD-10-CM | POA: Diagnosis not present

## 2015-12-24 DIAGNOSIS — Z4789 Encounter for other orthopedic aftercare: Secondary | ICD-10-CM | POA: Diagnosis not present

## 2015-12-24 DIAGNOSIS — M1990 Unspecified osteoarthritis, unspecified site: Secondary | ICD-10-CM | POA: Diagnosis not present

## 2015-12-24 DIAGNOSIS — J45909 Unspecified asthma, uncomplicated: Secondary | ICD-10-CM | POA: Diagnosis not present

## 2015-12-24 DIAGNOSIS — L97423 Non-pressure chronic ulcer of left heel and midfoot with necrosis of muscle: Secondary | ICD-10-CM | POA: Diagnosis not present

## 2015-12-24 DIAGNOSIS — M7989 Other specified soft tissue disorders: Secondary | ICD-10-CM | POA: Diagnosis not present

## 2015-12-24 DIAGNOSIS — Z981 Arthrodesis status: Secondary | ICD-10-CM | POA: Diagnosis not present

## 2015-12-24 DIAGNOSIS — T8469XD Infection and inflammatory reaction due to internal fixation device of other site, subsequent encounter: Secondary | ICD-10-CM | POA: Diagnosis not present

## 2015-12-24 DIAGNOSIS — G8918 Other acute postprocedural pain: Secondary | ICD-10-CM | POA: Diagnosis not present

## 2015-12-24 DIAGNOSIS — T85848A Pain due to other internal prosthetic devices, implants and grafts, initial encounter: Secondary | ICD-10-CM | POA: Diagnosis not present

## 2015-12-24 DIAGNOSIS — G90523 Complex regional pain syndrome I of lower limb, bilateral: Secondary | ICD-10-CM | POA: Diagnosis not present

## 2015-12-24 DIAGNOSIS — L97523 Non-pressure chronic ulcer of other part of left foot with necrosis of muscle: Secondary | ICD-10-CM | POA: Diagnosis not present

## 2015-12-24 DIAGNOSIS — Z7982 Long term (current) use of aspirin: Secondary | ICD-10-CM | POA: Diagnosis not present

## 2016-01-06 ENCOUNTER — Other Ambulatory Visit: Payer: Self-pay | Admitting: *Deleted

## 2016-01-06 MED ORDER — HYDROCODONE-ACETAMINOPHEN 5-325 MG PO TABS: ORAL_TABLET | ORAL | Status: DC

## 2016-01-06 NOTE — Telephone Encounter (Signed)
Last f/u 02/2015-CPE 

## 2016-01-06 NOTE — Telephone Encounter (Signed)
Lm on pts vm and informed pt Rx is available for pickup from the front desk

## 2016-01-13 DIAGNOSIS — L97523 Non-pressure chronic ulcer of other part of left foot with necrosis of muscle: Secondary | ICD-10-CM | POA: Diagnosis not present

## 2016-01-13 DIAGNOSIS — T85848D Pain due to other internal prosthetic devices, implants and grafts, subsequent encounter: Secondary | ICD-10-CM | POA: Diagnosis not present

## 2016-01-13 DIAGNOSIS — Z4889 Encounter for other specified surgical aftercare: Secondary | ICD-10-CM | POA: Diagnosis not present

## 2016-01-25 ENCOUNTER — Other Ambulatory Visit: Payer: Self-pay | Admitting: *Deleted

## 2016-01-25 MED ORDER — TRAMADOL HCL 50 MG PO TABS: ORAL_TABLET | ORAL | 0 refills | Status: DC

## 2016-01-25 NOTE — Telephone Encounter (Signed)
Rx called in to requested pharmacy 

## 2016-01-25 NOTE — Telephone Encounter (Signed)
Last f/u 02/2015-CPE 

## 2016-02-03 DIAGNOSIS — Z4789 Encounter for other orthopedic aftercare: Secondary | ICD-10-CM | POA: Diagnosis not present

## 2016-02-03 DIAGNOSIS — M2142 Flat foot [pes planus] (acquired), left foot: Secondary | ICD-10-CM | POA: Diagnosis not present

## 2016-02-08 ENCOUNTER — Other Ambulatory Visit: Payer: Self-pay | Admitting: *Deleted

## 2016-02-08 NOTE — Telephone Encounter (Signed)
Last f/u 02/2015-CPE 

## 2016-02-08 NOTE — Telephone Encounter (Signed)
Ok to print and put in my box for signature. 

## 2016-02-09 MED ORDER — HYDROCODONE-ACETAMINOPHEN 5-325 MG PO TABS: ORAL_TABLET | ORAL | 0 refills | Status: DC

## 2016-02-10 NOTE — Telephone Encounter (Signed)
Spoke to pt and informed her Rx is available for pickup from the front desk 

## 2016-02-29 ENCOUNTER — Other Ambulatory Visit: Payer: Self-pay | Admitting: *Deleted

## 2016-02-29 MED ORDER — TRAMADOL HCL 50 MG PO TABS: ORAL_TABLET | ORAL | 0 refills | Status: DC

## 2016-02-29 NOTE — Telephone Encounter (Signed)
Last f/u 02/2015 

## 2016-02-29 NOTE — Telephone Encounter (Signed)
Pt left v/m requesting status of tramadol refill; pt request cb. 

## 2016-03-01 NOTE — Telephone Encounter (Signed)
Rx called in to requested pharmacy 

## 2016-03-09 ENCOUNTER — Other Ambulatory Visit: Payer: Self-pay | Admitting: Family Medicine

## 2016-03-09 DIAGNOSIS — Z0000 Encounter for general adult medical examination without abnormal findings: Secondary | ICD-10-CM

## 2016-03-10 ENCOUNTER — Other Ambulatory Visit: Payer: Self-pay

## 2016-03-10 MED ORDER — HYDROCODONE-ACETAMINOPHEN 5-325 MG PO TABS: ORAL_TABLET | ORAL | 0 refills | Status: DC

## 2016-03-10 NOTE — Telephone Encounter (Signed)
RX printed and signed and placed in MYD box 

## 2016-03-10 NOTE — Telephone Encounter (Signed)
Pt left v/m requesting rx hydrocodone apap. Call when ready for pick up. rx last printed # 180 on 02/09/16; pt last seen 03/16/15. Pt has CPX scheduled on 03/23/16.

## 2016-03-10 NOTE — Telephone Encounter (Signed)
Patient contacted by this Clinical research associatewriter, notified r/x is written and ready for pick up.

## 2016-03-13 DIAGNOSIS — Z8739 Personal history of other diseases of the musculoskeletal system and connective tissue: Secondary | ICD-10-CM | POA: Diagnosis not present

## 2016-03-13 DIAGNOSIS — Z885 Allergy status to narcotic agent status: Secondary | ICD-10-CM | POA: Diagnosis not present

## 2016-03-13 DIAGNOSIS — Z882 Allergy status to sulfonamides status: Secondary | ICD-10-CM | POA: Diagnosis not present

## 2016-03-13 DIAGNOSIS — Z9889 Other specified postprocedural states: Secondary | ICD-10-CM | POA: Diagnosis not present

## 2016-03-13 DIAGNOSIS — Z91040 Latex allergy status: Secondary | ICD-10-CM | POA: Diagnosis not present

## 2016-03-13 DIAGNOSIS — Z4789 Encounter for other orthopedic aftercare: Secondary | ICD-10-CM | POA: Diagnosis not present

## 2016-03-13 DIAGNOSIS — Z888 Allergy status to other drugs, medicaments and biological substances status: Secondary | ICD-10-CM | POA: Diagnosis not present

## 2016-03-13 DIAGNOSIS — M25572 Pain in left ankle and joints of left foot: Secondary | ICD-10-CM | POA: Diagnosis not present

## 2016-03-13 DIAGNOSIS — S99922A Unspecified injury of left foot, initial encounter: Secondary | ICD-10-CM | POA: Diagnosis not present

## 2016-03-13 DIAGNOSIS — M79672 Pain in left foot: Secondary | ICD-10-CM | POA: Diagnosis not present

## 2016-03-13 DIAGNOSIS — R262 Difficulty in walking, not elsewhere classified: Secondary | ICD-10-CM | POA: Diagnosis not present

## 2016-03-13 DIAGNOSIS — M2142 Flat foot [pes planus] (acquired), left foot: Secondary | ICD-10-CM | POA: Diagnosis not present

## 2016-03-16 ENCOUNTER — Other Ambulatory Visit: Payer: Self-pay

## 2016-03-16 ENCOUNTER — Other Ambulatory Visit: Payer: Commercial Managed Care - HMO

## 2016-03-16 ENCOUNTER — Ambulatory Visit (INDEPENDENT_AMBULATORY_CARE_PROVIDER_SITE_OTHER): Payer: Commercial Managed Care - HMO

## 2016-03-16 DIAGNOSIS — J45909 Unspecified asthma, uncomplicated: Secondary | ICD-10-CM | POA: Diagnosis not present

## 2016-03-16 DIAGNOSIS — E669 Obesity, unspecified: Secondary | ICD-10-CM | POA: Diagnosis not present

## 2016-03-16 DIAGNOSIS — Z885 Allergy status to narcotic agent status: Secondary | ICD-10-CM | POA: Diagnosis not present

## 2016-03-16 DIAGNOSIS — Z886 Allergy status to analgesic agent status: Secondary | ICD-10-CM | POA: Diagnosis not present

## 2016-03-16 DIAGNOSIS — Z0000 Encounter for general adult medical examination without abnormal findings: Secondary | ICD-10-CM | POA: Diagnosis not present

## 2016-03-16 DIAGNOSIS — Z888 Allergy status to other drugs, medicaments and biological substances status: Secondary | ICD-10-CM | POA: Diagnosis not present

## 2016-03-16 DIAGNOSIS — M2142 Flat foot [pes planus] (acquired), left foot: Secondary | ICD-10-CM | POA: Diagnosis not present

## 2016-03-16 DIAGNOSIS — Z6824 Body mass index (BMI) 24.0-24.9, adult: Secondary | ICD-10-CM | POA: Diagnosis not present

## 2016-03-16 DIAGNOSIS — Z882 Allergy status to sulfonamides status: Secondary | ICD-10-CM | POA: Diagnosis not present

## 2016-03-16 DIAGNOSIS — Z4789 Encounter for other orthopedic aftercare: Secondary | ICD-10-CM | POA: Diagnosis not present

## 2016-03-16 LAB — CBC WITH DIFFERENTIAL/PLATELET
BASOS PCT: 0.5 % (ref 0.0–3.0)
Basophils Absolute: 0 10*3/uL (ref 0.0–0.1)
EOS PCT: 1.4 % (ref 0.0–5.0)
Eosinophils Absolute: 0.1 10*3/uL (ref 0.0–0.7)
HEMATOCRIT: 31.1 % — AB (ref 36.0–46.0)
HEMOGLOBIN: 10 g/dL — AB (ref 12.0–15.0)
LYMPHS PCT: 21.7 % (ref 12.0–46.0)
Lymphs Abs: 1.6 10*3/uL (ref 0.7–4.0)
MCHC: 32.1 g/dL (ref 30.0–36.0)
MCV: 79.9 fl (ref 78.0–100.0)
Monocytes Absolute: 0.6 10*3/uL (ref 0.1–1.0)
Monocytes Relative: 7.9 % (ref 3.0–12.0)
NEUTROS ABS: 5.2 10*3/uL (ref 1.4–7.7)
Neutrophils Relative %: 68.5 % (ref 43.0–77.0)
Platelets: 530 10*3/uL — ABNORMAL HIGH (ref 150.0–400.0)
RBC: 3.9 Mil/uL (ref 3.87–5.11)
RDW: 16.2 % — AB (ref 11.5–15.5)
WBC: 7.6 10*3/uL (ref 4.0–10.5)

## 2016-03-16 LAB — COMPREHENSIVE METABOLIC PANEL
ALBUMIN: 3.4 g/dL — AB (ref 3.5–5.2)
ALT: 9 U/L (ref 0–35)
AST: 9 U/L (ref 0–37)
Alkaline Phosphatase: 81 U/L (ref 39–117)
BUN: 9 mg/dL (ref 6–23)
CALCIUM: 9.3 mg/dL (ref 8.4–10.5)
CHLORIDE: 104 meq/L (ref 96–112)
CO2: 27 mEq/L (ref 19–32)
CREATININE: 0.6 mg/dL (ref 0.40–1.20)
GFR: 109.7 mL/min (ref >60.00)
Glucose, Bld: 89 mg/dL (ref 70–99)
POTASSIUM: 4.1 meq/L (ref 3.5–5.1)
Sodium: 140 mEq/L (ref 135–145)
Total Bilirubin: 0.2 mg/dL (ref 0.2–1.2)
Total Protein: 8.1 g/dL (ref 6.0–8.3)

## 2016-03-16 LAB — LIPID PANEL
CHOLESTEROL: 132 mg/dL (ref 0–200)
HDL: 39.5 mg/dL (ref >39.00)
LDL CALC: 80 mg/dL (ref 0–99)
NonHDL: 92.35
TRIGLYCERIDES: 62 mg/dL (ref 0.0–149.0)
Total CHOL/HDL Ratio: 3
VLDL: 12.4 mg/dL (ref 0.0–40.0)

## 2016-03-16 LAB — TSH: TSH: 2.6 u[IU]/mL (ref 0.35–4.50)

## 2016-03-16 MED ORDER — TIZANIDINE HCL 2 MG PO TABS: ORAL_TABLET | ORAL | 0 refills | Status: DC

## 2016-03-16 NOTE — Patient Instructions (Signed)
Ms. Jessica Shelton , Thank you for taking time to come for your Medicare Wellness Visit. I appreciate your ongoing commitment to your health goals. Please review the following plan we discussed and let me know if I can assist you in the future.   These are the goals we discussed: Goals    . Increase water intake          Starting 03/16/2016, I will attempt to drink 8 oz of water with each meal daily.        This is a list of the screening recommended for you and due dates:  Health Maintenance  Topic Date Due  . Mammogram  06/18/2016*  . Colon Cancer Screening  03/15/2017*  . Flu Shot  01/25/2019*  . Pap Smear  03/12/2017  . Tetanus Vaccine  06/16/2018  .  Hepatitis C: One time screening is recommended by Center for Disease Control  (CDC) for  adults born from [birth year] through 1966.   Completed  . HIV Screening  Completed  *Topic was postponed. The date shown is not the original due date.   Preventive Care for Adults  A healthy lifestyle and preventive care can promote health and wellness. Preventive health guidelines for adults include the following key practices.  . A routine yearly physical is a good way to check with your health care provider about your health and preventive screening. It is a chance to share any concerns and updates on your health and to receive a thorough exam.  . Visit your dentist for a routine exam and preventive care every 6 months. Brush your teeth twice a day and floss once a day. Good oral hygiene prevents tooth decay and gum disease.  . The frequency of eye exams is based on your age, health, family medical history, use  of contact lenses, and other factors. Follow your health care provider's ecommendations for frequency of eye exams.  . Eat a healthy diet. Foods like vegetables, fruits, whole grains, low-fat dairy products, and lean protein foods contain the nutrients you need without too many calories. Decrease your intake of foods high in solid fats, added  sugars, and salt. Eat the right amount of calories for you. Get information about a proper diet from your health care provider, if necessary.  . Regular physical exercise is one of the most important things you can do for your health. Most adults should get at least 150 minutes of moderate-intensity exercise (any activity that increases your heart rate and causes you to sweat) each week. In addition, most adults need muscle-strengthening exercises on 2 or more days a week.  Silver Sneakers may be a benefit available to you. To determine eligibility, you may visit the website: www.silversneakers.com or contact program at [phone] Mon-Fri between 8AM-8PM.   . Maintain a healthy weight. The body mass index (BMI) is a screening tool to identify possible weight problems. It provides an estimate of body fat based on height and weight. Your health care provider can find your BMI and can help you achieve or maintain a healthy weight.   For adults 20 years and older: ? A BMI below 18.5 is considered underweight. ? A BMI of 18.5 to 24.9 is normal. ? A BMI of 25 to 29.9 is considered overweight. ? A BMI of 30 and above is considered obese.   . Maintain normal blood lipids and cholesterol levels by exercising and minimizing your intake of saturated fat. Eat a balanced diet with plenty of fruit and vegetables. Blood  tests for lipids and cholesterol should begin at age 55 and be repeated every 5 years. If your lipid or cholesterol levels are high, you are over 50, or you are at high risk for heart disease, you may need your cholesterol levels checked more frequently. Ongoing high lipid and cholesterol levels should be treated with medicines if diet and exercise are not working.  . If you smoke, find out from your health care provider how to quit. If you do not use tobacco, please do not start.  . If you choose to drink alcohol, please do not consume more than 2 drinks per day. One drink is considered to  be 12 ounces (355 mL) of beer, 5 ounces (148 mL) of wine, or 1.5 ounces (44 mL) of liquor.  . If you are 68-60 years old, ask your health care provider if you should take aspirin to prevent strokes.  . Use sunscreen. Apply sunscreen liberally and repeatedly throughout the day. You should seek shade when your shadow is shorter than you. Protect yourself by wearing long sleeves, pants, a wide-brimmed hat, and sunglasses year round, whenever you are outdoors.  . Once a month, do a whole body skin exam, using a mirror to look at the skin on your back. Tell your health care provider of new moles, moles that have irregular borders, moles that are larger than a pencil eraser, or moles that have changed in shape or color.

## 2016-03-16 NOTE — Progress Notes (Signed)
I reviewed health advisor's note, was available for consultation, and agree with documentation and plan.  

## 2016-03-16 NOTE — Progress Notes (Signed)
Pre visit review using our clinic review tool, if applicable. No additional management support is needed unless otherwise documented below in the visit note. 

## 2016-03-16 NOTE — Progress Notes (Signed)
PCP notes:   Health maintenance:  Mammogram - pt will schedule future appt  Abnormal screenings:   Mini-Cog score: 17/20  Patient concerns:   Pt has requested refill for Zanaflex. Telephone refill request sent to PCP. Pt also does not have Albuterol inhaler. Pt wants to check with insurance about cost before requesting another inhaler.   Nurse concerns:  None  Next PCP appt:   03/23/16 @ 0915

## 2016-03-16 NOTE — Progress Notes (Signed)
Subjective:   Jessica Shelton is a 55 y.o. female who presents for Medicare Annual (Subsequent) preventive examination.  Review of Systems:  N/A  Note: Pt does not have any cardiac risk factors at this time.      Objective:     Vitals: BP 110/70 (BP Location: Right Arm, Patient Position: Sitting, Cuff Size: Normal)   Pulse 73   Temp 98.4 F (36.9 C) (Oral)   Ht 5\' 4"  (1.626 m)   Wt 151 lb 12 oz (68.8 kg) Comment: boot to left foot  SpO2 99%   BMI 26.05 kg/m   Body mass index is 26.05 kg/m.   Tobacco History  Smoking Status  . Never Smoker  Smokeless Tobacco  . Never Used     Counseling given: No   Past Medical History:  Diagnosis Date  . GERD (gastroesophageal reflux disease)   . Pain syndrome, chronic    complex region   Past Surgical History:  Procedure Laterality Date  . CARPAL TUNNEL RELEASE  01/2006  . FOOT SURGERY Left 06/18/2015   [facility]  . HAND SURGERY     left  . ROTATOR CUFF REPAIR  08/2000  . thumb surgery     pollicized left   History reviewed. No pertinent family history. History  Sexual Activity  . Sexual activity: Yes    Outpatient Encounter Prescriptions as of 03/16/2016  Medication Sig  . hydrochlorothiazide (HYDRODIURIL) 25 MG tablet TAKE 1/2 TO 1 TABLET BY MOUTH ONCE A DAYAS NEEDED FOR SWELLING  . HYDROcodone-acetaminophen (NORCO/VICODIN) 5-325 MG tablet TAKE 1 TO 2 TABLETS BY MOUTH EVERY SIX HOURS AS NEEDED FOR PAIN  . Lidocaine (LIDODERM EX) Apply thinnly to tender areas on left shoulder 2-3 times daily   . lidocaine (LIDODERM) 5 % 1-2 patches on effected area, leave on for no longer than 12 hours   . tiZANidine (ZANAFLEX) 2 MG tablet TAKE 1 TABLET BY MOUTH TWICE A DAY  . traMADol (ULTRAM) 50 MG tablet TAKE 1-2 TABLETS BY MOUTH EVERY 6 HOURS.COMPLETE PHYSICAL EXAM REQUIRED FOR ADDITIONAL REFILLS  . albuterol (PROVENTIL,VENTOLIN) 90 MCG/ACT inhaler Inhale 2 puffs into the lungs every 6 (six) hours as needed.     No  facility-administered encounter medications on file as of 03/16/2016.     Activities of Daily Living In your present state of health, do you have any difficulty performing the following activities: 03/16/2016  Hearing? N  Vision? N  Difficulty concentrating or making decisions? N  Walking or climbing stairs? Y  Dressing or bathing? N  Doing errands, shopping? N  Preparing Food and eating ? N  Using the Toilet? N  In the past six months, have you accidently leaked urine? N  Do you have problems with loss of bowel control? N  Managing your Medications? N  Managing your Finances? N  Housekeeping or managing your Housekeeping? Y  Some recent data might be hidden    Patient Care Team: Dianne Dun, MD as PCP - General Morton Peters, MD as Referring Physician (Physical Medicine and Rehabilitation) Lucita Ferrara, MD as Referring Physician (Orthopedic Surgery) L. Remonia Richter, MD as Referring Physician (Orthopedic Surgery)    Assessment:     Hearing Screening   125Hz  250Hz  500Hz  1000Hz  2000Hz  3000Hz  4000Hz  6000Hz  8000Hz   Right ear:   40 40 40  40    Left ear:   40 40 40  40      Visual Acuity Screening   Right eye Left  eye Both eyes  Without correction: 20/30 20/40 20/30   With correction:       Exercise Activities and Dietary recommendations Current Exercise Habits: The patient does not participate in regular exercise at present, Exercise limited by: None identified  Goals    . Increase water intake          Starting 03/16/2016, I will attempt to drink 8 oz of water with each meal daily.       Fall Risk Fall Risk  03/16/2016 03/16/2015  Falls in the past year? No Yes  Number falls in past yr: - 1  Injury with Fall? - Yes   Depression Screen PHQ 2/9 Scores 03/16/2016 03/16/2015  PHQ - 2 Score 0 0     Cognitive Testing MMSE - Mini Mental State Exam 03/16/2016  Orientation to time 5  Orientation to Place 5  Registration 3  Attention/ Calculation 0  Recall 0    Recall-comments pt was unable to recall 3 of 3 words  Language- name 2 objects 0  Language- repeat 1  Language- follow 3 step command 3  Language- read & follow direction 0  Write a sentence 0  Copy design 0  Total score 17    PLEASE NOTE: A Mini-Cog screen was completed. Maximum score is 20. A value of 0 denotes this part of Folstein MMSE was not completed or the patient failed this part of the Mini-Cog screening.   Mini-Cog Screening Orientation to Time - Max 5 pts Orientation to Place - Max 5 pts Registration - Max 3 pts Recall - Max 3 pts Language Repeat - Max 1 pts Language Follow 3 Step Command - Max 3 pts   Immunization History  Administered Date(s) Administered  . Td 01/15/2006, 06/16/2008   Screening Tests Health Maintenance  Topic Date Due  . MAMMOGRAM  06/18/2016 (Originally 03/11/2016)  . COLONOSCOPY  03/15/2017 (Originally 08/28/2009)  . INFLUENZA VACCINE  01/25/2019 (Originally 01/18/2016)  . PAP SMEAR  03/12/2017  . TETANUS/TDAP  06/16/2018  . Hepatitis C Screening  Completed  . HIV Screening  Completed      Plan:     I have personally reviewed and addressed the Medicare Annual Wellness questionnaire and have noted the following in the patient's chart:  A. Medical and social history B. Use of alcohol, tobacco or illicit drugs  C. Current medications and supplements D. Functional ability and status E.  Nutritional status F.  Physical activity G. Advance directives H. List of other physicians I.  Hospitalizations, surgeries, and ER visits in previous 12 months J.  Vitals K. Screenings to include hearing, vision, cognitive, depression L. Referrals and appointments - none  In addition, I have reviewed and discussed with patient certain preventive protocols, quality metrics, and best practice recommendations. A written personalized care plan for preventive services as well as general preventive health recommendations were provided to patient.  See  attached scanned questionnaire for additional information.   Signed,   Randa EvensLesia Thanos [place], MHA, BS, LPN Health Advisor

## 2016-03-16 NOTE — Telephone Encounter (Signed)
Patient was in today for AWV. Pt states she needs a refill of Zanaflex. Pt does not have any remaining pills from previous prescription. Pt has requested this [facility].

## 2016-03-23 ENCOUNTER — Encounter: Payer: Commercial Managed Care - HMO | Admitting: Family Medicine

## 2016-03-30 ENCOUNTER — Other Ambulatory Visit: Payer: Self-pay | Admitting: *Deleted

## 2016-03-30 MED ORDER — TRAMADOL HCL 50 MG PO TABS: ORAL_TABLET | ORAL | 0 refills | Status: DC

## 2016-03-30 NOTE — Telephone Encounter (Signed)
Last f/u 03/2015 but pt has 04/12/2016 appt scheduled. pls advise

## 2016-03-31 NOTE — Telephone Encounter (Signed)
Rx called in to requested pharmacy 

## 2016-04-04 DIAGNOSIS — M25675 Stiffness of left foot, not elsewhere classified: Secondary | ICD-10-CM | POA: Diagnosis not present

## 2016-04-04 DIAGNOSIS — M79672 Pain in left foot: Secondary | ICD-10-CM | POA: Diagnosis not present

## 2016-04-04 DIAGNOSIS — R262 Difficulty in walking, not elsewhere classified: Secondary | ICD-10-CM | POA: Diagnosis not present

## 2016-04-04 DIAGNOSIS — M2142 Flat foot [pes planus] (acquired), left foot: Secondary | ICD-10-CM | POA: Diagnosis not present

## 2016-04-12 ENCOUNTER — Encounter: Payer: Self-pay | Admitting: Family Medicine

## 2016-04-12 ENCOUNTER — Ambulatory Visit (INDEPENDENT_AMBULATORY_CARE_PROVIDER_SITE_OTHER): Payer: Commercial Managed Care - HMO | Admitting: Family Medicine

## 2016-04-12 VITALS — BP 130/68 | HR 70 | Temp 97.5°F

## 2016-04-12 DIAGNOSIS — M2142 Flat foot [pes planus] (acquired), left foot: Secondary | ICD-10-CM | POA: Diagnosis not present

## 2016-04-12 DIAGNOSIS — G9050 Complex regional pain syndrome I, unspecified: Secondary | ICD-10-CM | POA: Diagnosis not present

## 2016-04-12 DIAGNOSIS — Q690 Accessory finger(s): Secondary | ICD-10-CM | POA: Diagnosis not present

## 2016-04-12 DIAGNOSIS — D508 Other iron deficiency anemias: Secondary | ICD-10-CM | POA: Diagnosis not present

## 2016-04-12 DIAGNOSIS — M79672 Pain in left foot: Secondary | ICD-10-CM | POA: Diagnosis not present

## 2016-04-12 DIAGNOSIS — Z01419 Encounter for gynecological examination (general) (routine) without abnormal findings: Secondary | ICD-10-CM | POA: Diagnosis not present

## 2016-04-12 DIAGNOSIS — R262 Difficulty in walking, not elsewhere classified: Secondary | ICD-10-CM | POA: Diagnosis not present

## 2016-04-12 DIAGNOSIS — M25675 Stiffness of left foot, not elsewhere classified: Secondary | ICD-10-CM | POA: Diagnosis not present

## 2016-04-12 DIAGNOSIS — G8929 Other chronic pain: Secondary | ICD-10-CM | POA: Diagnosis not present

## 2016-04-12 MED ORDER — HYDROCODONE-ACETAMINOPHEN 5-325 MG PO TABS: ORAL_TABLET | ORAL | 0 refills | Status: DC

## 2016-04-12 NOTE — Assessment & Plan Note (Signed)
Does not want to take iron.  Refusing colonoscopy/IFOB- denies blood in stool.

## 2016-04-12 NOTE — Assessment & Plan Note (Signed)
Continue current rx- reasonable control.  No changes made.

## 2016-04-12 NOTE — Assessment & Plan Note (Signed)
Reviewed preventive care protocols, scheduled due services, and updated immunizations Discussed nutrition, exercise, diet, and healthy lifestyle.  

## 2016-04-12 NOTE — Progress Notes (Signed)
Pre visit review using our clinic review tool, if applicable. No additional management support is needed unless otherwise documented below in the visit note. 

## 2016-04-12 NOTE — Patient Instructions (Signed)
Please call to schedule your mammogram.  Great to see you.

## 2016-04-12 NOTE — Progress Notes (Addendum)
55 yo pleasant female with complex medical history for CPX and follow up of chronic medical conditions.  Medicare annual wellness visit with Lu Duffel, RN on 03/16/16. Note reviewed.  Pain syndrome- very complex.  Has polydactyl and other associated abnormalities requiring multiple surgeries per year with specialist at [facility]- having complications from a left wound infection s/p surgery in 05/2015.  Had to re operate to flush infection in 10/2015.  Hardware removed 12/24/2015.  Now in PT to try to strengthen that area.  Currently continues to take the following for pain management- Take Vicodin 5-500 four times daily, Tramadol 100 mg every 6 hours, Flexeril and Tizanidine 2 mg two times a day as needed.  Also has a Lidoderm patch.  Anemia- deteriorated. Cannot tolerate iron.  Not willing to have a colonoscopy. Lab Results  Component Value Date   WBC 7.6 03/16/2016   HGB 10.0 (L) 03/16/2016   HCT 31.1 (L) 03/16/2016   MCV 79.9 03/16/2016   PLT 530.0 (H) 03/16/2016    Well woman- due for colonoscopy but refusing.   Refusing IFOB as well .  Denies any changes in her bowel habits or blood in her stool.  Mammogram 09/07/14- advised follow up mammogram 02/2015.  UTD pap smear- last done by me 03/13/15.  No h/o abnormal pap smears.  Lab Results  Component Value Date   CHOL 132 03/16/2016   HDL 39.50 03/16/2016   LDLCALC 80 03/16/2016   LDLDIRECT 138.8 02/25/2013   TRIG 62.0 03/16/2016   CHOLHDL 3 03/16/2016    Lab Results  Component Value Date   NA 140 03/16/2016   K 4.1 03/16/2016   CL 104 03/16/2016   CO2 27 03/16/2016    Lab Results  Component Value Date   CREATININE 0.60 03/16/2016   Lab Results  Component Value Date   TSH 2.60 03/16/2016   Lab Results  Component Value Date   WBC 7.6 03/16/2016   HGB 10.0 (L) 03/16/2016   HCT 31.1 (L) 03/16/2016   MCV 79.9 03/16/2016   PLT 530.0 (H) 03/16/2016    Patient Active Problem List  Diagnosis  . VITAMIN D  DEFICIENCY  . Anemia  . Reflex sympathetic dystrophy  . PAIN, CHRONIC NEC  . CARPAL TUNNEL SYNDROME  . STRICTURE, ESOPHAGEAL  . UNSPECIFIED DISORDER OF KIDNEY AND URETER  . Osteoarthritis of spine  . POLYDACTYLY, HANDS  . Well woman exam   Past Medical History:  Diagnosis Date  . GERD (gastroesophageal reflux disease)   . Pain syndrome, chronic    complex region   Past Surgical History:  Procedure Laterality Date  . CARPAL TUNNEL RELEASE  01/2006  . FOOT SURGERY Left 06/18/2015   [facility]  . HAND SURGERY     left  . ROTATOR CUFF REPAIR  08/2000  . thumb surgery     pollicized left   Social History  Substance Use Topics  . Smoking status: Never Smoker  . Smokeless tobacco: Never Used  . Alcohol use No   No family history on file. Allergies  Allergen Reactions  . Hydromorphone Hcl Anaphylaxis and Itching  . Latex   . Clonidine Hydrochloride   . Codeine     REACTION: anaphylaxis  . Dilaudid [Hydromorphone Hcl] Itching and Dermatitis  . Morphine     REACTION: anaphylaxis  . Oxycodone-Acetaminophen     REACTION: anaphylaxis  . Prednisone     REACTION: difficulty breathing  . Sulfamethoxazole-Trimethoprim     REACTION: rash  .  Sulfonamide Derivatives     REACTION: urticaria (hives)   Current Outpatient Prescriptions on File Prior to Visit  Medication Sig Dispense Refill  . albuterol (PROVENTIL,VENTOLIN) 90 MCG/ACT inhaler Inhale 2 puffs into the lungs every 6 (six) hours as needed.      . hydrochlorothiazide (HYDRODIURIL) 25 MG tablet TAKE 1/2 TO 1 TABLET BY MOUTH ONCE A DAYAS NEEDED FOR SWELLING 90 tablet 3  . Lidocaine (LIDODERM EX) Apply thinnly to tender areas on left shoulder 2-3 times daily     . lidocaine (LIDODERM) 5 % 1-2 patches on effected area, leave on for no longer than 12 hours     . tiZANidine (ZANAFLEX) 2 MG tablet TAKE 1 TABLET BY MOUTH TWICE A DAY 180 tablet 0  . traMADol (ULTRAM) 50 MG tablet TAKE 1-2 TABLETS BY MOUTH EVERY 6  HOURS.COMPLETE PHYSICAL EXAM REQUIRED FOR ADDITIONAL REFILLS 270 tablet 0   No current facility-administered medications on file prior to visit.      The PMH, PSH, Social History, Family History, Medications, and allergies have been reviewed in [facility], and have been updated if relevant.   Review of Systems  Constitutional: Positive for fatigue.  HENT: Negative.   Eyes: Negative.   Respiratory: Negative.   Cardiovascular: Negative.   Gastrointestinal: Negative.  Negative for anal bleeding and blood in stool.  Genitourinary: Negative.   Musculoskeletal: Positive for gait problem, myalgias, neck pain and neck stiffness.  Skin: Negative.   Allergic/Immunologic: Negative.   Hematological: Negative.   Psychiatric/Behavioral: Negative.   All other systems reviewed and are negative.   Physical Exam BP 130/68   Pulse 70   Temp 97.5 F (36.4 C) (Oral)   SpO2 97%  Wt Readings from Last 3 Encounters:  03/16/16 151 lb 12 oz (68.8 kg)  03/16/15 171 lb 4 oz (77.7 kg)  09/03/14 153 lb (69.4 kg)     General:  Well-developed,well-nourished,in no acute distress; alert,appropriate and cooperative throughout examination Head:  normocephalic and atraumatic.   Eyes:  vision grossly intact, pupils equal, pupils round, and pupils reactive to light.   Ears:  R ear normal and L ear normal.   Nose:  no external deformity.   Mouth:  good dentition.   Neck:  No deformities, masses, or tenderness noted. Breasts:  No mass, nodules, thickening, tenderness, bulging, retraction, inflamation, nipple discharge or skin changes noted.   Lungs:  Normal respiratory effort, chest expands symmetrically. Lungs are clear to auscultation, no crackles or wheezes. Heart:  Normal rate and regular rhythm. S1 and S2 normal without gallop, murmur, click, rub or other extra sounds. Abdomen:  Bowel sounds positive,abdomen soft and non-tender without masses, organomegaly or hernias noted. Msk:  Polydactyly Boot on left  foot Extremities:  No clubbing, cyanosis, edema, or deformity noted with normal full range of motion of all joints.   Neurologic:  alert & oriented X3 and gait normal.   Skin:  Intact without suspicious lesions or rashes Cervical Nodes:  No lymphadenopathy noted Axillary Nodes:  No palpable lymphadenopathy Psych:  Cognition and judgment appear intact. Alert and cooperative with normal attention span and concentration. No apparent delusions, illusions, hallucinations

## 2016-04-14 DIAGNOSIS — M79672 Pain in left foot: Secondary | ICD-10-CM | POA: Diagnosis not present

## 2016-04-14 DIAGNOSIS — R262 Difficulty in walking, not elsewhere classified: Secondary | ICD-10-CM | POA: Diagnosis not present

## 2016-04-14 DIAGNOSIS — M2142 Flat foot [pes planus] (acquired), left foot: Secondary | ICD-10-CM | POA: Diagnosis not present

## 2016-04-14 DIAGNOSIS — M25675 Stiffness of left foot, not elsewhere classified: Secondary | ICD-10-CM | POA: Diagnosis not present

## 2016-04-18 DIAGNOSIS — R262 Difficulty in walking, not elsewhere classified: Secondary | ICD-10-CM | POA: Diagnosis not present

## 2016-04-18 DIAGNOSIS — M79672 Pain in left foot: Secondary | ICD-10-CM | POA: Diagnosis not present

## 2016-04-18 DIAGNOSIS — M25675 Stiffness of left foot, not elsewhere classified: Secondary | ICD-10-CM | POA: Diagnosis not present

## 2016-04-18 DIAGNOSIS — M2142 Flat foot [pes planus] (acquired), left foot: Secondary | ICD-10-CM | POA: Diagnosis not present

## 2016-04-25 DIAGNOSIS — M2142 Flat foot [pes planus] (acquired), left foot: Secondary | ICD-10-CM | POA: Diagnosis not present

## 2016-04-25 DIAGNOSIS — M25675 Stiffness of left foot, not elsewhere classified: Secondary | ICD-10-CM | POA: Diagnosis not present

## 2016-04-25 DIAGNOSIS — R262 Difficulty in walking, not elsewhere classified: Secondary | ICD-10-CM | POA: Diagnosis not present

## 2016-04-25 DIAGNOSIS — M79672 Pain in left foot: Secondary | ICD-10-CM | POA: Diagnosis not present

## 2016-04-26 DIAGNOSIS — R262 Difficulty in walking, not elsewhere classified: Secondary | ICD-10-CM | POA: Diagnosis not present

## 2016-04-26 DIAGNOSIS — M25675 Stiffness of left foot, not elsewhere classified: Secondary | ICD-10-CM | POA: Diagnosis not present

## 2016-04-26 DIAGNOSIS — M2142 Flat foot [pes planus] (acquired), left foot: Secondary | ICD-10-CM | POA: Diagnosis not present

## 2016-04-26 DIAGNOSIS — M79672 Pain in left foot: Secondary | ICD-10-CM | POA: Diagnosis not present

## 2016-05-02 ENCOUNTER — Other Ambulatory Visit: Payer: Self-pay | Admitting: *Deleted

## 2016-05-02 MED ORDER — TRAMADOL HCL 50 MG PO TABS: ORAL_TABLET | ORAL | 0 refills | Status: DC

## 2016-05-02 NOTE — Telephone Encounter (Signed)
Rx called in to requested pharmacy 

## 2016-05-02 NOTE — Telephone Encounter (Signed)
Last f/u 03/2016-CPE 

## 2016-05-05 DIAGNOSIS — R262 Difficulty in walking, not elsewhere classified: Secondary | ICD-10-CM | POA: Diagnosis not present

## 2016-05-05 DIAGNOSIS — M2142 Flat foot [pes planus] (acquired), left foot: Secondary | ICD-10-CM | POA: Diagnosis not present

## 2016-05-05 DIAGNOSIS — M25675 Stiffness of left foot, not elsewhere classified: Secondary | ICD-10-CM | POA: Diagnosis not present

## 2016-05-05 DIAGNOSIS — M79672 Pain in left foot: Secondary | ICD-10-CM | POA: Diagnosis not present

## 2016-05-10 ENCOUNTER — Other Ambulatory Visit: Payer: Self-pay | Admitting: *Deleted

## 2016-05-10 MED ORDER — HYDROCODONE-ACETAMINOPHEN 5-325 MG PO TABS: ORAL_TABLET | ORAL | 0 refills | Status: DC

## 2016-05-10 NOTE — Telephone Encounter (Signed)
Last f/u 03/2016-CPE 

## 2016-05-10 NOTE — Telephone Encounter (Signed)
Spoke to pt and informed her Rx is available for pickup from the front desk 

## 2016-05-16 ENCOUNTER — Encounter: Payer: Self-pay | Admitting: Family Medicine

## 2016-05-16 DIAGNOSIS — M25675 Stiffness of left foot, not elsewhere classified: Secondary | ICD-10-CM | POA: Diagnosis not present

## 2016-05-16 DIAGNOSIS — R262 Difficulty in walking, not elsewhere classified: Secondary | ICD-10-CM | POA: Diagnosis not present

## 2016-05-16 DIAGNOSIS — M79672 Pain in left foot: Secondary | ICD-10-CM | POA: Diagnosis not present

## 2016-05-16 DIAGNOSIS — M2142 Flat foot [pes planus] (acquired), left foot: Secondary | ICD-10-CM | POA: Diagnosis not present

## 2016-05-17 ENCOUNTER — Encounter: Payer: Self-pay | Admitting: Family Medicine

## 2016-05-17 DIAGNOSIS — Z79891 Long term (current) use of opiate analgesic: Secondary | ICD-10-CM | POA: Diagnosis not present

## 2016-05-19 DIAGNOSIS — M79672 Pain in left foot: Secondary | ICD-10-CM | POA: Diagnosis not present

## 2016-05-19 DIAGNOSIS — R262 Difficulty in walking, not elsewhere classified: Secondary | ICD-10-CM | POA: Diagnosis not present

## 2016-05-19 DIAGNOSIS — M2142 Flat foot [pes planus] (acquired), left foot: Secondary | ICD-10-CM | POA: Diagnosis not present

## 2016-05-19 DIAGNOSIS — M25675 Stiffness of left foot, not elsewhere classified: Secondary | ICD-10-CM | POA: Diagnosis not present

## 2016-05-26 ENCOUNTER — Other Ambulatory Visit: Payer: Self-pay | Admitting: Family Medicine

## 2016-05-29 NOTE — Telephone Encounter (Signed)
Left refill on voice mail at pharmacy  

## 2016-05-29 NOTE — Telephone Encounter (Signed)
Last f/u 03/2016-CPE 

## 2016-06-14 ENCOUNTER — Other Ambulatory Visit: Payer: Self-pay | Admitting: Family Medicine

## 2016-06-14 MED ORDER — HYDROCODONE-ACETAMINOPHEN 5-325 MG PO TABS: ORAL_TABLET | ORAL | 0 refills | Status: DC

## 2016-06-14 NOTE — Addendum Note (Signed)
Addended by: Desmond DikeKNIGHT, Ralphine Hinks H on: 06/14/2016 09:07 AM   Modules accepted: Orders

## 2016-06-14 NOTE — Telephone Encounter (Signed)
Spoke to pt and informed her Rx is available for pickup from the front desk 

## 2016-06-14 NOTE — Telephone Encounter (Signed)
Dr Dayton MartesAron approved this Rx but is out of the office until the new year. Would you mind signing it in her absence?

## 2016-06-14 NOTE — Telephone Encounter (Signed)
RX printed and signed and placed in MYD box 

## 2016-06-14 NOTE — Telephone Encounter (Signed)
Last f/u 03/2016 

## 2016-07-08 ENCOUNTER — Other Ambulatory Visit: Payer: Self-pay | Admitting: Family Medicine

## 2016-07-08 ENCOUNTER — Other Ambulatory Visit: Payer: Self-pay | Admitting: Internal Medicine

## 2016-07-10 NOTE — Telephone Encounter (Signed)
Rx called in to requested pharmacy 

## 2016-07-10 NOTE — Telephone Encounter (Signed)
Last f/u 03/2016-CPE 

## 2016-07-24 ENCOUNTER — Other Ambulatory Visit: Payer: Self-pay | Admitting: Internal Medicine

## 2016-07-24 ENCOUNTER — Other Ambulatory Visit: Payer: Self-pay | Admitting: Family Medicine

## 2016-07-24 NOTE — Telephone Encounter (Signed)
Last filled 06/14/16--please advise 

## 2016-07-24 NOTE — Telephone Encounter (Signed)
Spoke to pt and informed her Rx is available for pickup from the front desk 

## 2016-08-08 ENCOUNTER — Telehealth: Payer: Self-pay | Admitting: Family Medicine

## 2016-08-08 NOTE — Telephone Encounter (Signed)
Last f/u 03/2016 

## 2016-08-08 NOTE — Telephone Encounter (Signed)
Rx called in to requested pharmacy 

## 2016-08-09 MED ORDER — TRAMADOL HCL 50 MG PO TABS: ORAL_TABLET | ORAL | 0 refills | Status: DC

## 2016-08-09 NOTE — Telephone Encounter (Signed)
I'm not sure why quantity was changed.  Yes please update.

## 2016-08-09 NOTE — Addendum Note (Signed)
Addended by: Dixie DialsVALENCIA, Adelyn Roscher on: 08/09/2016 10:33 AM   Modules accepted: Orders

## 2016-08-09 NOTE — Telephone Encounter (Signed)
[facility] as FYI to Dr Dayton MartesAron ins will only pay for 30 day supply so gave pt # 240 of Tramadol instead of # 270. No cb needed just FYI. Do you want the med list changed to # 240 since that is what pt received?

## 2016-08-09 NOTE — Telephone Encounter (Signed)
Changed.

## 2016-08-21 DIAGNOSIS — M960 Pseudarthrosis after fusion or arthrodesis: Secondary | ICD-10-CM | POA: Diagnosis not present

## 2016-08-21 DIAGNOSIS — E669 Obesity, unspecified: Secondary | ICD-10-CM | POA: Diagnosis not present

## 2016-08-21 DIAGNOSIS — M19042 Primary osteoarthritis, left hand: Secondary | ICD-10-CM | POA: Diagnosis not present

## 2016-08-21 DIAGNOSIS — G90512 Complex regional pain syndrome I of left upper limb: Secondary | ICD-10-CM | POA: Diagnosis not present

## 2016-08-21 DIAGNOSIS — J45909 Unspecified asthma, uncomplicated: Secondary | ICD-10-CM | POA: Diagnosis not present

## 2016-08-21 DIAGNOSIS — Z882 Allergy status to sulfonamides status: Secondary | ICD-10-CM | POA: Diagnosis not present

## 2016-08-21 DIAGNOSIS — M25522 Pain in left elbow: Secondary | ICD-10-CM | POA: Diagnosis not present

## 2016-08-21 DIAGNOSIS — Z79899 Other long term (current) drug therapy: Secondary | ICD-10-CM | POA: Diagnosis not present

## 2016-08-21 DIAGNOSIS — Z6841 Body Mass Index (BMI) 40.0 and over, adult: Secondary | ICD-10-CM | POA: Diagnosis not present

## 2016-08-23 ENCOUNTER — Other Ambulatory Visit: Payer: Self-pay | Admitting: Family Medicine

## 2016-08-23 NOTE — Telephone Encounter (Signed)
Last filled 07-28-16 #180 Last OV 04-12-16 No Future OV

## 2016-08-23 NOTE — Telephone Encounter (Signed)
Spoke to pt. RX up front ready for pickup 

## 2016-09-12 ENCOUNTER — Other Ambulatory Visit: Payer: Self-pay | Admitting: Family Medicine

## 2016-09-12 NOTE — Telephone Encounter (Signed)
Called pharmacy for rx refilled

## 2016-09-12 NOTE — Telephone Encounter (Signed)
Pt last seen 04/12/2016..last refill 08/09/2016....please advise

## 2016-09-28 ENCOUNTER — Other Ambulatory Visit: Payer: Self-pay | Admitting: Family Medicine

## 2016-09-28 NOTE — Telephone Encounter (Signed)
Left message to call office. Rx is up front ready for pickup

## 2016-09-28 NOTE — Telephone Encounter (Signed)
Last filled 08-29-16 #180 Last OV 04-12-16 No Future OV. Forwarding to Dr Patsy Lager in Dr Elmer Sow absence

## 2016-10-03 DIAGNOSIS — M960 Pseudarthrosis after fusion or arthrodesis: Secondary | ICD-10-CM | POA: Diagnosis not present

## 2016-10-03 DIAGNOSIS — G90513 Complex regional pain syndrome I of upper limb, bilateral: Secondary | ICD-10-CM | POA: Diagnosis not present

## 2016-10-03 DIAGNOSIS — J45909 Unspecified asthma, uncomplicated: Secondary | ICD-10-CM | POA: Diagnosis not present

## 2016-10-03 DIAGNOSIS — M5416 Radiculopathy, lumbar region: Secondary | ICD-10-CM | POA: Diagnosis not present

## 2016-10-03 DIAGNOSIS — M1812 Unilateral primary osteoarthritis of first carpometacarpal joint, left hand: Secondary | ICD-10-CM | POA: Diagnosis not present

## 2016-10-03 DIAGNOSIS — M25522 Pain in left elbow: Secondary | ICD-10-CM | POA: Diagnosis not present

## 2016-10-03 DIAGNOSIS — G8929 Other chronic pain: Secondary | ICD-10-CM | POA: Diagnosis not present

## 2016-10-03 DIAGNOSIS — S63052A Subluxation of other carpometacarpal joint of left hand, initial encounter: Secondary | ICD-10-CM | POA: Diagnosis not present

## 2016-10-03 DIAGNOSIS — T8484XA Pain due to internal orthopedic prosthetic devices, implants and grafts, initial encounter: Secondary | ICD-10-CM | POA: Diagnosis not present

## 2016-10-03 DIAGNOSIS — Z472 Encounter for removal of internal fixation device: Secondary | ICD-10-CM | POA: Diagnosis not present

## 2016-10-03 DIAGNOSIS — S63213A Subluxation of metacarpophalangeal joint of left middle finger, initial encounter: Secondary | ICD-10-CM | POA: Diagnosis not present

## 2016-10-03 DIAGNOSIS — G90512 Complex regional pain syndrome I of left upper limb: Secondary | ICD-10-CM | POA: Diagnosis not present

## 2016-10-03 DIAGNOSIS — M20002 Unspecified deformity of left finger(s): Secondary | ICD-10-CM | POA: Diagnosis not present

## 2016-10-03 DIAGNOSIS — M65842 Other synovitis and tenosynovitis, left hand: Secondary | ICD-10-CM | POA: Diagnosis not present

## 2016-10-03 DIAGNOSIS — M25542 Pain in joints of left hand: Secondary | ICD-10-CM | POA: Diagnosis not present

## 2016-10-10 DIAGNOSIS — M25642 Stiffness of left hand, not elsewhere classified: Secondary | ICD-10-CM | POA: Diagnosis not present

## 2016-10-10 DIAGNOSIS — M25649 Stiffness of unspecified hand, not elsewhere classified: Secondary | ICD-10-CM | POA: Diagnosis not present

## 2016-10-17 ENCOUNTER — Other Ambulatory Visit: Payer: Self-pay | Admitting: Family Medicine

## 2016-10-17 DIAGNOSIS — M960 Pseudarthrosis after fusion or arthrodesis: Secondary | ICD-10-CM | POA: Diagnosis not present

## 2016-10-17 DIAGNOSIS — T8484XD Pain due to internal orthopedic prosthetic devices, implants and grafts, subsequent encounter: Secondary | ICD-10-CM | POA: Diagnosis not present

## 2016-10-17 DIAGNOSIS — Z9889 Other specified postprocedural states: Secondary | ICD-10-CM | POA: Diagnosis not present

## 2016-10-17 DIAGNOSIS — Z4789 Encounter for other orthopedic aftercare: Secondary | ICD-10-CM | POA: Diagnosis not present

## 2016-10-17 NOTE — Telephone Encounter (Signed)
Last filled 09-12-16 #240 Last OV CPE 04-12-16 No Future OV

## 2016-10-17 NOTE — Telephone Encounter (Signed)
Left refill on voice mail at pharmacy  

## 2016-10-24 DIAGNOSIS — M25642 Stiffness of left hand, not elsewhere classified: Secondary | ICD-10-CM | POA: Diagnosis not present

## 2016-11-06 ENCOUNTER — Other Ambulatory Visit: Payer: Self-pay | Admitting: Family Medicine

## 2016-11-06 NOTE — Telephone Encounter (Signed)
Last office visit 04/12/2016.  Last refilled 09/28/2016 for #180 with no refills.  Ok to refill?

## 2016-11-06 NOTE — Telephone Encounter (Signed)
Left detailed message.  PLACED IN BLUE FOLDER

## 2016-11-07 ENCOUNTER — Encounter: Payer: Self-pay | Admitting: Family Medicine

## 2016-11-08 DIAGNOSIS — Z79891 Long term (current) use of opiate analgesic: Secondary | ICD-10-CM | POA: Diagnosis not present

## 2016-11-20 DIAGNOSIS — J45909 Unspecified asthma, uncomplicated: Secondary | ICD-10-CM | POA: Diagnosis not present

## 2016-11-20 DIAGNOSIS — Z9889 Other specified postprocedural states: Secondary | ICD-10-CM | POA: Diagnosis not present

## 2016-11-20 DIAGNOSIS — Z969 Presence of functional implant, unspecified: Secondary | ICD-10-CM | POA: Diagnosis not present

## 2016-11-20 DIAGNOSIS — Z4889 Encounter for other specified surgical aftercare: Secondary | ICD-10-CM | POA: Diagnosis not present

## 2016-11-23 ENCOUNTER — Other Ambulatory Visit: Payer: Self-pay | Admitting: Family Medicine

## 2016-11-23 NOTE — Telephone Encounter (Signed)
Last refill 10/17/16 #240 Last OV 04/12/16 Ok to refill?

## 2016-11-24 NOTE — Telephone Encounter (Signed)
[facility] [place] - [id number]: [phone]

## 2016-12-04 ENCOUNTER — Other Ambulatory Visit: Payer: Self-pay | Admitting: Family Medicine

## 2016-12-04 NOTE — Telephone Encounter (Signed)
Spoke w/ Pt, informed that Rx has been placed at front desk for pick up.

## 2016-12-04 NOTE — Telephone Encounter (Signed)
Pt is requesting refill on Hydrocodone 5-325mg .   Last OV: 04/12/2016 Last Fill: 11/06/2016 #180 and 0RF UDS: 11/07/2016 Low risk  Please advise

## 2016-12-04 NOTE — Telephone Encounter (Signed)
Rx printed.  Ok to refill as requested.

## 2017-01-01 ENCOUNTER — Other Ambulatory Visit: Payer: Self-pay | Admitting: Family Medicine

## 2017-01-01 NOTE — Telephone Encounter (Signed)
Last refill 11/23/16 #240 Last OV 04/12/16 Ok to refill?

## 2017-01-01 NOTE — Telephone Encounter (Signed)
Faxed to  [facility] - [place] - [address]  [address] [place] [address]  Phone: [phone] Fax: [phone]    

## 2017-01-01 NOTE — Telephone Encounter (Signed)
Last refill NORCO 12/02/16 #180,  ZANAFLEX 07/24/2016 #180 +1 Last OV 04/12/16 Ok to refill?

## 2017-01-01 NOTE — Telephone Encounter (Signed)
Indication for chronic opioid: polydactyl s/p multiple surgeries  Medication and dose: Norco 5-325 1 -2 tab every 6 hours as needed for pain # pills per month: 180 Last UDS date: 11/08/16 Pain contract signed (Y/N): Y Date narcotic database last reviewed (include red flags): 01/01/17

## 2017-01-30 ENCOUNTER — Other Ambulatory Visit: Payer: Self-pay | Admitting: Family Medicine

## 2017-03-05 DIAGNOSIS — M65311 Trigger thumb, right thumb: Secondary | ICD-10-CM | POA: Diagnosis not present

## 2017-03-05 DIAGNOSIS — Q699 Polydactyly, unspecified: Secondary | ICD-10-CM | POA: Diagnosis not present

## 2017-03-13 ENCOUNTER — Other Ambulatory Visit: Payer: Self-pay | Admitting: Family Medicine

## 2017-03-13 NOTE — Telephone Encounter (Signed)
Last filled 02/02/17 Tramadol /Hydrocodone  Last filled tizanidine  01/01/17 Last office visit 10/25/117 No office visit scheduled

## 2017-03-21 ENCOUNTER — Other Ambulatory Visit: Payer: Self-pay | Admitting: Family Medicine

## 2017-03-22 NOTE — Telephone Encounter (Signed)
Last refill 03/13/17  Last OV 04/12/16 Ok to refill?

## 2017-03-22 NOTE — Telephone Encounter (Signed)
Pt left v/m requesting cb with status of hydrocodone and tramadol rx.

## 2017-03-23 MED ORDER — TRAMADOL HCL 50 MG PO TABS: ORAL_TABLET | ORAL | 0 refills | Status: DC

## 2017-03-23 MED ORDER — HYDROCODONE-ACETAMINOPHEN 5-325 MG PO TABS: 1.0000 | ORAL_TABLET | Freq: Four times a day (QID) | ORAL | 0 refills | Status: DC | PRN

## 2017-03-23 NOTE — Addendum Note (Signed)
Addended by: Damita Lack on: 03/23/2017 11:03 AM   Modules accepted: Orders

## 2017-03-23 NOTE — Telephone Encounter (Addendum)
Pt cb and wants to know status of hydrocodone and tramadol rx. Cannot find printed rx for hydrocodone or tramadol; not at front desk and not at Dr Elmer Sow CMA desk and not on Dr Elmer Sow desk; [facility] and last time either rx was filled was 02/02/17. I spoke with Dr Ermalene Searing and she said if I would confirm with Dr Dayton Martes about the rx she would print rx this time. I spoke with Dr Dayton Martes and she said pt has had numerous surgeries and is OK to print rx for hydrocodone and tramadol. Lupita Leash CMA will print and have Dr Ermalene Searing sign and rxs will be at front desk for pick up at 12 noon. Pt notified and voiced understanding. Pt was upset that the person who answered the phone told pt that she could take tramadol until Monday when Dr Dayton Martes returned. I apologized to pt and pt said if she had done that when she worked she would have lost her job. Again I apologized to pt and pt will be here at 12 noon to pick up rxs. Also see refill phone note from 03/21/17 also.

## 2017-03-23 NOTE — Telephone Encounter (Signed)
Also see 03/13/17 refill note.

## 2017-03-23 NOTE — Telephone Encounter (Signed)
Unable to locate original prescriptions. Reprinted for Dr. Ermalene Searing will sign since Dr. Dayton Martes is out of the office today.

## 2017-03-27 ENCOUNTER — Other Ambulatory Visit: Payer: Self-pay | Admitting: Family Medicine

## 2017-03-27 DIAGNOSIS — Z1231 Encounter for screening mammogram for malignant neoplasm of breast: Secondary | ICD-10-CM

## 2017-03-27 NOTE — Telephone Encounter (Signed)
I was not in office on 03/13/17 at 539 when message routed back to me, on 9/26.

## 2017-04-02 ENCOUNTER — Other Ambulatory Visit: Payer: Self-pay | Admitting: Family Medicine

## 2017-04-02 DIAGNOSIS — Z01419 Encounter for gynecological examination (general) (routine) without abnormal findings: Secondary | ICD-10-CM

## 2017-04-10 ENCOUNTER — Other Ambulatory Visit (INDEPENDENT_AMBULATORY_CARE_PROVIDER_SITE_OTHER): Payer: Commercial Managed Care - HMO

## 2017-04-10 DIAGNOSIS — Z01419 Encounter for gynecological examination (general) (routine) without abnormal findings: Secondary | ICD-10-CM

## 2017-04-10 LAB — COMPREHENSIVE METABOLIC PANEL
ALBUMIN: 4.1 g/dL (ref 3.5–5.2)
ALK PHOS: 59 U/L (ref 39–117)
ALT: 9 U/L (ref 0–35)
AST: 9 U/L (ref 0–37)
BILIRUBIN TOTAL: 0.5 mg/dL (ref 0.2–1.2)
BUN: 10 mg/dL (ref 6–23)
CALCIUM: 9.5 mg/dL (ref 8.4–10.5)
CHLORIDE: 106 meq/L (ref 96–112)
CO2: 27 mEq/L (ref 19–32)
Creatinine, Ser: 0.64 mg/dL (ref 0.40–1.20)
GFR: 101.44 mL/min (ref >60.00)
GLUCOSE: 96 mg/dL (ref 70–99)
Potassium: 3.8 mEq/L (ref 3.5–5.1)
SODIUM: 140 meq/L (ref 135–145)
TOTAL PROTEIN: 7.3 g/dL (ref 6.0–8.3)

## 2017-04-10 LAB — LIPID PANEL
CHOL/HDL RATIO: 3
Cholesterol: 183 mg/dL (ref 0–200)
HDL: 64.5 mg/dL (ref >39.00)
LDL Cholesterol: 106 mg/dL — ABNORMAL HIGH (ref 0–99)
NONHDL: 118.22
TRIGLYCERIDES: 63 mg/dL (ref 0.0–149.0)
VLDL: 12.6 mg/dL (ref 0.0–40.0)

## 2017-04-10 LAB — CBC WITH DIFFERENTIAL/PLATELET
BASOS PCT: 1.3 % (ref 0.0–3.0)
Basophils Absolute: 0.1 10*3/uL (ref 0.0–0.1)
EOS ABS: 0.1 10*3/uL (ref 0.0–0.7)
EOS PCT: 2.2 % (ref 0.0–5.0)
HEMATOCRIT: 37.7 % (ref 36.0–46.0)
HEMOGLOBIN: 12.3 g/dL (ref 12.0–15.0)
Lymphocytes Relative: 23.8 % (ref 12.0–46.0)
Lymphs Abs: 1.3 10*3/uL (ref 0.7–4.0)
MCHC: 32.6 g/dL (ref 30.0–36.0)
MCV: 88.7 fl (ref 78.0–100.0)
Monocytes Absolute: 0.5 10*3/uL (ref 0.1–1.0)
Monocytes Relative: 9.7 % (ref 3.0–12.0)
Neutro Abs: 3.3 10*3/uL (ref 1.4–7.7)
Neutrophils Relative %: 63 % (ref 43.0–77.0)
Platelets: 281 10*3/uL (ref 150.0–400.0)
RBC: 4.25 Mil/uL (ref 3.87–5.11)
RDW: 15.3 % (ref 11.5–15.5)
WBC: 5.3 10*3/uL (ref 4.0–10.5)

## 2017-04-10 LAB — TSH: TSH: 3.13 u[IU]/mL (ref 0.35–4.50)

## 2017-04-17 ENCOUNTER — Ambulatory Visit
Admission: RE | Admit: 2017-04-17 | Discharge: 2017-04-17 | Disposition: A | Discharge status: Home / Self Care | Payer: Commercial Managed Care - HMO | Source: Ambulatory Visit | Attending: Family Medicine | Admitting: Family Medicine

## 2017-04-17 DIAGNOSIS — Z1231 Encounter for screening mammogram for malignant neoplasm of breast: Secondary | ICD-10-CM

## 2017-04-18 ENCOUNTER — Encounter: Payer: Commercial Managed Care - HMO | Admitting: Family Medicine

## 2017-04-24 DIAGNOSIS — M2142 Flat foot [pes planus] (acquired), left foot: Secondary | ICD-10-CM | POA: Diagnosis not present

## 2017-04-24 DIAGNOSIS — M778 Other enthesopathies, not elsewhere classified: Secondary | ICD-10-CM | POA: Diagnosis not present

## 2017-04-24 DIAGNOSIS — M85872 Other specified disorders of bone density and structure, left ankle and foot: Secondary | ICD-10-CM | POA: Diagnosis not present

## 2017-04-24 DIAGNOSIS — M7989 Other specified soft tissue disorders: Secondary | ICD-10-CM | POA: Diagnosis not present

## 2017-04-24 DIAGNOSIS — I7091 Generalized atherosclerosis: Secondary | ICD-10-CM | POA: Diagnosis not present

## 2017-04-24 DIAGNOSIS — M7752 Other enthesopathy of left foot: Secondary | ICD-10-CM | POA: Diagnosis not present

## 2017-04-24 DIAGNOSIS — M25572 Pain in left ankle and joints of left foot: Secondary | ICD-10-CM | POA: Diagnosis not present

## 2017-04-25 ENCOUNTER — Ambulatory Visit (INDEPENDENT_AMBULATORY_CARE_PROVIDER_SITE_OTHER): Payer: Commercial Managed Care - HMO | Admitting: Family Medicine

## 2017-04-25 ENCOUNTER — Other Ambulatory Visit (HOSPITAL_COMMUNITY)
Admission: RE | Admit: 2017-04-25 | Discharge: 2017-04-25 | Disposition: A | Discharge status: Home / Self Care | Payer: Commercial Managed Care - HMO | Source: Ambulatory Visit | Attending: Family Medicine | Admitting: Family Medicine

## 2017-04-25 ENCOUNTER — Encounter: Payer: Self-pay | Admitting: Family Medicine

## 2017-04-25 VITALS — BP 110/70 | HR 69 | Temp 97.6°F | Ht 64.0 in | Wt 180.0 lb

## 2017-04-25 DIAGNOSIS — G8929 Other chronic pain: Secondary | ICD-10-CM

## 2017-04-25 DIAGNOSIS — R1909 Other intra-abdominal and pelvic swelling, mass and lump: Secondary | ICD-10-CM | POA: Insufficient documentation

## 2017-04-25 DIAGNOSIS — D508 Other iron deficiency anemias: Secondary | ICD-10-CM

## 2017-04-25 DIAGNOSIS — Z0000 Encounter for general adult medical examination without abnormal findings: Secondary | ICD-10-CM | POA: Diagnosis not present

## 2017-04-25 DIAGNOSIS — Z124 Encounter for screening for malignant neoplasm of cervix: Secondary | ICD-10-CM

## 2017-04-25 DIAGNOSIS — Z01419 Encounter for gynecological examination (general) (routine) without abnormal findings: Secondary | ICD-10-CM

## 2017-04-25 DIAGNOSIS — Q690 Accessory finger(s): Secondary | ICD-10-CM

## 2017-04-25 NOTE — Assessment & Plan Note (Signed)
The patients weight, height, BMI and visual acuity have been recorded in the chart.  Cognitive function assessed.   I have made referrals, counseling and provided education to the patient based review of the above and I have provided the pt with a written personalized care plan for preventive services.  

## 2017-04-25 NOTE — Addendum Note (Signed)
Addended by: Dianne DunARON, TALIA M on: 04/25/2017 11:05 AM   Modules accepted: Orders

## 2017-04-25 NOTE — Assessment & Plan Note (Signed)
Reviewed preventive care protocols, scheduled due services, and updated immunizations Discussed nutrition, exercise, diet, and healthy lifestyle.  

## 2017-04-25 NOTE — Progress Notes (Addendum)
56 yo pleasant female with complex medical history for Annual medicare wellness visit, CPX and follow up of chronic medical conditions.  Health Maintenance  Topic Date Due  . PAP SMEAR  03/12/2017  . COLONOSCOPY  04/25/2018 (Originally 08/28/2009)  . INFLUENZA VACCINE  01/25/2019 (Originally 01/17/2017)  . TETANUS/TDAP  06/16/2018  . MAMMOGRAM  04/18/2019  . Hepatitis C Screening  Completed  . HIV Screening  Completed   Due for colonoscopy but refusing.   Refusing IFOB as well .  Denies any changes in her bowel habits or blood in her stool.  Mammogram 04/17/17  UTD pap smear- last done by me 03/13/15.  No h/o abnormal pap smears.   I have personally reviewed the Medicare Annual Wellness questionnaire and have noted 1. The patient's medical and social history 2. Their use of alcohol, tobacco or illicit drugs 3. Their current medications and supplements 4. The patient's functional ability including ADL's, fall risks, home safety risks and hearing or visual             impairment. 5. Diet and physical activities 6. Evidence for depression or mood disorders  End of life wishes discussed and updated in Social History.  The roster of all physicians providing medical care to patient - is listed in the CareTeams section of the chart.   Pain syndrome- very complex.  Has polydactyl and other associated abnormalities requiring [facility] per [facility]. Currently continues to take the following for pain management- Take Vicodin 5-500 four times daily, Tramadol 100 mg every 6 hours, Flexeril and Tizanidine 2 mg two times a day as needed.  Also has a Lidoderm patch.  Indication for chronic opioid: polydactyly, multiple surgeries Medication and dose: Vicodin 5- 550 four times daily as needed, lidoderm patch, Tramadol 100 mg every 6 hours as needed # pills per month: Vicodin- 180, Tramadol - 240 Last UDS date: 11/08/16 Pain contract signed (Y/N): Y Date narcotic  database last reviewed (include red flags): 05/05/2017  Right inguinal mass- has been there for months.  Not painful, no growing, no red or warm.  Lab Results  Component Value Date   WBC 5.3 04/10/2017   HGB 12.3 04/10/2017   HCT 37.7 04/10/2017   MCV 88.7 04/10/2017   PLT 281.0 04/10/2017     Lab Results  Component Value Date   CHOL 183 04/10/2017   HDL 64.50 04/10/2017   LDLCALC 106 (H) 04/10/2017   LDLDIRECT 138.8 02/25/2013   TRIG 63.0 04/10/2017   CHOLHDL 3 04/10/2017    Lab Results  Component Value Date   NA 140 04/10/2017   K 3.8 04/10/2017   CL 106 04/10/2017   CO2 27 04/10/2017    Lab Results  Component Value Date   CREATININE 0.64 04/10/2017   Lab Results  Component Value Date   TSH 3.13 04/10/2017   Lab Results  Component Value Date   WBC 5.3 04/10/2017   HGB 12.3 04/10/2017   HCT 37.7 04/10/2017   MCV 88.7 04/10/2017   PLT 281.0 04/10/2017    Patient Active Problem List   Diagnosis Date Noted  . Well woman exam 04/12/2016  . Medicare annual wellness visit, subsequent 03/16/2015  . UNSPECIFIED DISORDER OF KIDNEY AND URETER 03/21/2010  . VITAMIN D DEFICIENCY 12/25/2007  . Anemia 12/25/2007  . Reflex sympathetic dystrophy 04/15/2007  . Osteoarthritis of spine 04/15/2007  . PAIN, CHRONIC NEC 04/09/2007  . CARPAL TUNNEL SYNDROME 04/09/2007  . STRICTURE, ESOPHAGEAL 04/09/2007  . POLYDACTYLY,  HANDS 04/09/2007   Past Medical History:  Diagnosis Date  . GERD (gastroesophageal reflux disease)   . Pain syndrome, chronic    complex region   Past Surgical History:  Procedure Laterality Date  . CARPAL TUNNEL RELEASE  01/2006  . [facility]  . HAND SURGERY     left  . ROTATOR CUFF REPAIR  08/2000  . thumb surgery     pollicized left   Social History   Tobacco Use  . Smoking status: Never Smoker  . Smokeless tobacco: Never Used  Substance Use Topics  . Alcohol use: No    Alcohol/week: 0.0 oz  .  Drug use: No   No family history on file. Allergies  Allergen Reactions  . Hydromorphone Hcl Anaphylaxis and Itching  . Latex   . Oxycodone-Acetaminophen Anaphylaxis, Rash and Swelling  . Clonidine Hydrochloride   . Codeine     REACTION: anaphylaxis  . Dilaudid [Hydromorphone Hcl] Itching and Dermatitis  . Morphine     REACTION: anaphylaxis  . Oxycodone-Acetaminophen     REACTION: anaphylaxis  . Prednisone     REACTION: difficulty breathing  . Sulfamethoxazole-Trimethoprim     REACTION: rash  . Sulfonamide Derivatives     REACTION: urticaria (hives)   Current Outpatient Medications on File Prior to Visit  Medication Sig Dispense Refill  . albuterol (PROVENTIL,VENTOLIN) 90 MCG/ACT inhaler Inhale 2 puffs into the lungs every 6 (six) hours as needed.      . hydrochlorothiazide (HYDRODIURIL) 25 MG tablet TAKE 1/2 TO 1 TABLET BY MOUTH ONCE A DAYAS NEEDED FOR SWELLING 90 tablet 3  . HYDROcodone-acetaminophen (NORCO/VICODIN) 5-325 MG tablet Take 1-2 tablets by mouth every 6 (six) hours as needed. for pain 180 tablet 0  . tiZANidine (ZANAFLEX) 2 MG tablet TAKE 1 TABLET BY MOUTH TWICE A DAY 180 tablet 3  . traMADol (ULTRAM) 50 MG tablet TAKE 1-2 TABLETS BY MOUTH EVERY 6 HOURS AS NEEDED AS DIRECTED 240 tablet 0  . lidocaine (LIDODERM) 5 % 1-2 patches on effected area, leave on for no longer than 12 hours      No current facility-administered medications on file prior to visit.      The PMH, PSH, Social History, Family History, Medications, and allergies have been reviewed [facility], and have been updated if relevant.   Review of Systems  Constitutional: Positive for fatigue.  HENT: Negative.   Eyes: Negative.   Respiratory: Negative.   Cardiovascular: Negative.   Gastrointestinal: Negative.  Negative for anal bleeding and blood in stool.  Genitourinary: Negative.   Musculoskeletal: Positive for gait problem, myalgias, neck pain and neck stiffness.  Skin: Negative.    Allergic/Immunologic: Negative.   Hematological: Negative.   Psychiatric/Behavioral: Negative.   All other systems reviewed and are negative.   Physical Exam BP 110/70 (BP Location: Left Arm, Patient Position: Sitting, Cuff Size: Normal)   Pulse 69   Temp 97.6 F (36.4 C) (Oral)   Ht 5\' 4"  (1.626 m)   Wt 180 lb (81.6 kg)   SpO2 98%   BMI 30.90 kg/m  Wt Readings from Last 3 Encounters:  04/25/17 180 lb (81.6 kg)  03/16/16 151 lb 12 oz (68.8 kg)  03/16/15 171 lb 4 oz (77.7 kg)     General:  Well-developed,well-nourished,in no acute distress; alert,appropriate and cooperative throughout examination Head:  normocephalic and atraumatic.   Eyes:  vision grossly intact, PERRL Ears:  R ear normal and L ear normal externally,  TMs clear bilaterally Nose:  no external deformity.   Mouth:  good dentition.   Neck:  No deformities, masses, or tenderness noted. Breasts:  No mass, nodules, thickening, tenderness, bulging, retraction, inflamation, nipple discharge or skin changes noted.   Lungs:  Normal respiratory effort, chest expands symmetrically. Lungs are clear to auscultation, no crackles or wheezes. Heart:  Normal rate and regular rhythm. S1 and S2 normal without gallop, murmur, click, rub or other extra sounds. Abdomen:  Bowel sounds positive,abdomen soft and non-tender without masses, organomegaly or hernias noted. Firm, large, NTTP right inguinal mass Rectal:  no external abnormalities.   Genitalia:  Pelvic Exam:        External: normal female genitalia without lesions or masses        Vagina: normal without lesions or masses        Cervix: normal without lesions or masses        Adnexa: normal bimanual exam without masses or fullness        Uterus: normal by palpation        Pap smear: performed Msk: Polydactyly Extremities:  No clubbing, cyanosis, edema, or deformity noted with normal full range of motion of all joints.   Neurologic:  alert & oriented X3 and gait normal.    Skin:  Intact without suspicious lesions or rashes Cervical Nodes:  No lymphadenopathy noted Axillary Nodes:  No palpable lymphadenopathy Psych:  Cognition and judgment appear intact. Alert and cooperative with normal attention span and concentration. No apparent delusions, illusions, hallucinations

## 2017-04-25 NOTE — Assessment & Plan Note (Signed)
See HPI- chronic opioid indication warranted No red flags- UDS UTD, database reviewed.

## 2017-04-25 NOTE — Assessment & Plan Note (Signed)
Improved

## 2017-04-26 ENCOUNTER — Other Ambulatory Visit: Payer: Self-pay | Admitting: Family Medicine

## 2017-04-26 LAB — CYTOLOGY - PAP
DIAGNOSIS: NEGATIVE
HPV (WINDOPATH): NOT DETECTED

## 2017-04-26 NOTE — Telephone Encounter (Signed)
Requesting: Tramadol & Hydrocodone-APAP Contract: Yes UDS: 10.05.2018 Low-risk next screen 11.22.2018 Last OV: 11.07.2018 Next OV: Not scheduled Last Refill: 10.05.2018 & 10.05.2018   Please advise

## 2017-04-26 NOTE — Addendum Note (Signed)
Addended by: Shon MilletWATLINGTON, Shawne Bulow M on: 04/26/2017 11:35 AM   Modules accepted: Orders

## 2017-04-26 NOTE — Telephone Encounter (Signed)
LMOVM that Rx's are ready at the front/thx dmf

## 2017-04-27 ENCOUNTER — Ambulatory Visit
Admission: RE | Admit: 2017-04-27 | Discharge: 2017-04-27 | Disposition: A | Discharge status: Home / Self Care | Payer: Commercial Managed Care - HMO | Source: Ambulatory Visit | Attending: Family Medicine | Admitting: Family Medicine

## 2017-04-27 ENCOUNTER — Other Ambulatory Visit: Payer: Self-pay | Admitting: Family Medicine

## 2017-04-27 ENCOUNTER — Other Ambulatory Visit: Payer: Self-pay

## 2017-04-27 DIAGNOSIS — R2241 Localized swelling, mass and lump, right lower limb: Secondary | ICD-10-CM | POA: Diagnosis not present

## 2017-04-27 DIAGNOSIS — R1909 Other intra-abdominal and pelvic swelling, mass and lump: Secondary | ICD-10-CM

## 2017-04-27 MED ORDER — HYDROCHLOROTHIAZIDE 25 MG PO TABS: ORAL_TABLET | ORAL | 3 refills | Status: DC

## 2017-04-27 MED ORDER — TIZANIDINE HCL 2 MG PO TABS: 2.0000 mg | ORAL_TABLET | Freq: Two times a day (BID) | ORAL | 3 refills | Status: DC

## 2017-04-27 NOTE — Progress Notes (Signed)
Rx's faxed to [facility] per pt req/thx dmf

## 2017-05-28 ENCOUNTER — Other Ambulatory Visit: Payer: Self-pay | Admitting: Family Medicine

## 2017-05-30 NOTE — Telephone Encounter (Signed)
Pt wants this to go to [facility]/placed note for them to call pt when Harriett Sineancy gives to them/thx dmf

## 2017-05-30 NOTE — Telephone Encounter (Signed)
Patient's last OV was 11.7.18. Ok to refill?

## 2017-05-31 ENCOUNTER — Telehealth: Payer: Self-pay | Admitting: Family Medicine

## 2017-05-31 NOTE — Telephone Encounter (Signed)
Copied from CRM [id number]. Topic: Quick Communication - Office Called Patient >> May 31, 2017 10:58 AM Marcha SoldersWalker, Monica D wrote: I called and left message on patient voicemail. Prescription will be ready to pick[facility] office after 12pm. * PEC -ok to give information

## 2017-06-29 ENCOUNTER — Encounter: Payer: Self-pay | Admitting: Family Medicine

## 2017-06-29 ENCOUNTER — Ambulatory Visit (INDEPENDENT_AMBULATORY_CARE_PROVIDER_SITE_OTHER): Payer: Medicare HMO | Admitting: Family Medicine

## 2017-06-29 VITALS — BP 116/68 | HR 67 | Temp 98.2°F | Wt 180.0 lb

## 2017-06-29 DIAGNOSIS — G9050 Complex regional pain syndrome I, unspecified: Secondary | ICD-10-CM | POA: Diagnosis not present

## 2017-06-29 DIAGNOSIS — G8929 Other chronic pain: Secondary | ICD-10-CM

## 2017-06-29 MED ORDER — HYDROCODONE-ACETAMINOPHEN 5-325 MG PO TABS: 1.0000 | ORAL_TABLET | Freq: Four times a day (QID) | ORAL | 0 refills | Status: DC | PRN

## 2017-06-29 MED ORDER — TRAMADOL HCL 50 MG PO TABS: ORAL_TABLET | ORAL | 1 refills | Status: DC

## 2017-06-29 MED ORDER — HYDROCODONE-ACETAMINOPHEN 5-325 MG PO TABS: ORAL_TABLET | ORAL | 0 refills | Status: DC

## 2017-06-29 MED ORDER — HYDROCHLOROTHIAZIDE 25 MG PO TABS: ORAL_TABLET | ORAL | 3 refills | Status: DC

## 2017-06-29 MED ORDER — TIZANIDINE HCL 2 MG PO TABS: 2.0000 mg | ORAL_TABLET | Freq: Two times a day (BID) | ORAL | 3 refills | Status: DC

## 2017-06-29 NOTE — Patient Instructions (Signed)
It was a pleasure to see you today  Please schedule a follow up visit in 6 months  Let me know at least 2 weeks before you need your pain medication refilled

## 2017-06-29 NOTE — Progress Notes (Signed)
Subjective:    Patient ID: Jessica Shelton, female    DOB: 1960-11-05, 57 y.o.   MRN: [id number]  HPI This is a 57 yo female who presents today to establish care, she is transferring from Dr. Dayton Martes.  She has a long history of musculoskeletal deformaties and over 70 surgeries.  Dr. Dayton Martes has been managing her chronic pain medication. She has been maintained on oxycodone 5-325 mg, 1-2 every 6 hours prn and Tramadol every 6 hours prn. Poor sleep due to pain. Has been to pain clinic and has tried Lyrica and gabapentin without any relief.  Has CRPS in all extremities. Not a candidate for implantable spinal stimulator due to widespread nature of pain.Has appointment with [facility] orthopaedic surgeon,  Dr. Laurice Record, next month. Sees him regularly.  She had to go out on disability several years ago. Her three young grandchildren live with her and her husband which keeps her busy. Youngest grandson in remission with leukemia. She misses her old job at [employer] where she worked for many years.   Last CPE- 04/25/17 Mammo- 04/17/17 Pap- 04/25/17, negative HPV, repeat 5 years Colonoscopy- patient refuses, also FOBT Tdap- 06/16/2008 Flu- declines Eye- regular Dental- regular  Indication for chronic opioid: chronic pain syndrome Medication and dose: hydrocodone-acetaminophen 3-325 mg 1-2 tablets every 6 hours prn # pills per month: 180 Last UDS date: 11/08/16  Pain contract signed (Y/N): y Date narcotic database last reviewed (include red flags): 06/29/17  Past Medical History:  Diagnosis Date  . GERD (gastroesophageal reflux disease)   . Pain syndrome, chronic    complex region   Past Surgical History:  Procedure Laterality Date  . CARPAL TUNNEL RELEASE  01/2006  . FOOT SURGERY Left 06/18/2015   [facility]  . HAND SURGERY     left  . ROTATOR CUFF REPAIR  08/2000  . thumb surgery     pollicized left   No family history on file. Social History   Tobacco Use  . Smoking  status: Never Smoker  . Smokeless tobacco: Never Used  Substance Use Topics  . Alcohol use: No    Alcohol/week: 0.0 oz  . Drug use: No      Review of Systems  Respiratory: Negative for shortness of breath.   Cardiovascular: Negative for chest pain, palpitations and leg swelling.  Gastrointestinal: Negative for abdominal pain, constipation and diarrhea.  Genitourinary: Negative for dysuria.  Musculoskeletal:       Pain tolerable with pain medication. Pain in all extremities.        Objective:   Physical Exam  Constitutional: She is oriented to person, place, and time. She appears well-developed and well-nourished.  HENT:  Head: Normocephalic and atraumatic.  Cardiovascular: Normal rate, regular rhythm and normal heart sounds.  Pulmonary/Chest: Effort normal and breath sounds normal.  Neurological: She is alert and oriented to person, place, and time.  Skin: Skin is warm and dry.  Psychiatric: She has a normal mood and affect. Her behavior is normal. Judgment and thought content normal.  Vitals reviewed.     BP 116/68   Pulse 67   Temp 98.2 F (36.8 C) (Oral)   Wt 180 lb (81.6 kg)   SpO2 98%   BMI 30.90 kg/m  Wt Readings from Last 3 Encounters:  06/29/17 180 lb (81.6 kg)  04/25/17 180 lb (81.6 kg)  03/16/16 151 lb 12 oz (68.8 kg)       Assessment & Plan:  1. PAIN, CHRONIC NEC -  Reviewed expectations with narcotic pain management, will give patient 3 prescriptions, can call for 3 more, must have office visit every 6 months, reviewed pain contract. Patient agrees.  - tiZANidine (ZANAFLEX) 2 MG tablet; Take 1 tablet (2 mg total) by mouth 2 (two) times daily.  Dispense: 180 tablet; Refill: 3 - traMADol (ULTRAM) 50 MG tablet; TAKE 1 TO 2 TABLETS BY MOUTH EVERY 6 HOURS AS NEEDED AS DIRECTED.  Dispense: 240 tablet; Refill: 1 - HYDROcodone-acetaminophen (NORCO/VICODIN) 5-325 MG tablet; TAKE 1 TO 2 TABLETS BY MOUTH EVERY SIX HOURS AS NEEDED FOR PAIN  Dispense: 180 tablet;  Refill: 0 - HYDROcodone-acetaminophen (NORCO/VICODIN) 5-325 MG tablet; Take 1-2 tablets by mouth every 6 (six) hours as needed for moderate pain. May fill 30 days after date on prescription  Dispense: 180 tablet; Refill: 0 - HYDROcodone-acetaminophen (NORCO/VICODIN) 5-325 MG tablet; Take 1-2 tablets by mouth every 6 (six) hours as needed for moderate pain. May fill 60 days after date on prescription  Dispense: 180 tablet; Refill: 0  2. Reflex sympathetic dystrophy - tiZANidine (ZANAFLEX) 2 MG tablet; Take 1 tablet (2 mg total) by mouth 2 (two) times daily.  Dispense: 180 tablet; Refill: 3 - traMADol (ULTRAM) 50 MG tablet; TAKE 1 TO 2 TABLETS BY MOUTH EVERY 6 HOURS AS NEEDED AS DIRECTED.  Dispense: 240 tablet; Refill: 1 - HYDROcodone-acetaminophen (NORCO/VICODIN) 5-325 MG tablet; TAKE 1 TO 2 TABLETS BY MOUTH EVERY SIX HOURS AS NEEDED FOR PAIN  Dispense: 180 tablet; Refill: 0 - HYDROcodone-acetaminophen (NORCO/VICODIN) 5-325 MG tablet; Take 1-2 tablets by mouth every 6 (six) hours as needed for moderate pain. May fill 30 days after date on prescription  Dispense: 180 tablet; Refill: 0 - HYDROcodone-acetaminophen (NORCO/VICODIN) 5-325 MG tablet; Take 1-2 tablets by mouth every 6 (six) hours as needed for moderate pain. May fill 60 days after date on prescription  Dispense: 180 tablet; Refill: 0  - follow up in 6 months for CPE  Olean Reeeborah Gessner, [name]  [facility]  06/29/2017 1:29 PM

## 2017-07-03 ENCOUNTER — Other Ambulatory Visit: Payer: Self-pay | Admitting: *Deleted

## 2017-07-10 ENCOUNTER — Other Ambulatory Visit: Payer: Self-pay | Admitting: *Deleted

## 2017-08-01 DIAGNOSIS — M19041 Primary osteoarthritis, right hand: Secondary | ICD-10-CM | POA: Diagnosis not present

## 2017-08-01 DIAGNOSIS — M1811 Unilateral primary osteoarthritis of first carpometacarpal joint, right hand: Secondary | ICD-10-CM | POA: Diagnosis not present

## 2017-08-01 DIAGNOSIS — Z981 Arthrodesis status: Secondary | ICD-10-CM | POA: Diagnosis not present

## 2017-08-01 DIAGNOSIS — M65311 Trigger thumb, right thumb: Secondary | ICD-10-CM | POA: Diagnosis not present

## 2017-08-01 DIAGNOSIS — Z91040 Latex allergy status: Secondary | ICD-10-CM | POA: Diagnosis not present

## 2017-08-01 DIAGNOSIS — M21931 Unspecified acquired deformity of right forearm: Secondary | ICD-10-CM | POA: Diagnosis not present

## 2017-08-01 DIAGNOSIS — M25531 Pain in right wrist: Secondary | ICD-10-CM | POA: Diagnosis not present

## 2017-08-01 DIAGNOSIS — Z885 Allergy status to narcotic agent status: Secondary | ICD-10-CM | POA: Diagnosis not present

## 2017-08-01 DIAGNOSIS — G90511 Complex regional pain syndrome I of right upper limb: Secondary | ICD-10-CM | POA: Diagnosis not present

## 2017-08-14 ENCOUNTER — Other Ambulatory Visit: Payer: Self-pay | Admitting: Family Medicine

## 2017-08-14 DIAGNOSIS — G9050 Complex regional pain syndrome I, unspecified: Secondary | ICD-10-CM

## 2017-08-14 DIAGNOSIS — G8929 Other chronic pain: Secondary | ICD-10-CM

## 2017-08-21 DIAGNOSIS — M25341 Other instability, right hand: Secondary | ICD-10-CM | POA: Diagnosis not present

## 2017-08-21 DIAGNOSIS — M659 Synovitis and tenosynovitis, unspecified: Secondary | ICD-10-CM | POA: Diagnosis not present

## 2017-08-21 DIAGNOSIS — Z981 Arthrodesis status: Secondary | ICD-10-CM | POA: Diagnosis not present

## 2017-08-21 DIAGNOSIS — G8929 Other chronic pain: Secondary | ICD-10-CM | POA: Diagnosis not present

## 2017-08-21 DIAGNOSIS — M19041 Primary osteoarthritis, right hand: Secondary | ICD-10-CM | POA: Diagnosis not present

## 2017-08-21 DIAGNOSIS — Z885 Allergy status to narcotic agent status: Secondary | ICD-10-CM | POA: Diagnosis not present

## 2017-08-21 DIAGNOSIS — I872 Venous insufficiency (chronic) (peripheral): Secondary | ICD-10-CM | POA: Diagnosis not present

## 2017-08-21 DIAGNOSIS — M1811 Unilateral primary osteoarthritis of first carpometacarpal joint, right hand: Secondary | ICD-10-CM | POA: Diagnosis not present

## 2017-08-21 DIAGNOSIS — Z882 Allergy status to sulfonamides status: Secondary | ICD-10-CM | POA: Diagnosis not present

## 2017-08-21 DIAGNOSIS — G90511 Complex regional pain syndrome I of right upper limb: Secondary | ICD-10-CM | POA: Diagnosis not present

## 2017-08-21 DIAGNOSIS — Z79891 Long term (current) use of opiate analgesic: Secondary | ICD-10-CM | POA: Diagnosis not present

## 2017-08-21 DIAGNOSIS — Z888 Allergy status to other drugs, medicaments and biological substances status: Secondary | ICD-10-CM | POA: Diagnosis not present

## 2017-08-21 DIAGNOSIS — M65311 Trigger thumb, right thumb: Secondary | ICD-10-CM | POA: Diagnosis not present

## 2017-08-22 DIAGNOSIS — G90511 Complex regional pain syndrome I of right upper limb: Secondary | ICD-10-CM | POA: Diagnosis not present

## 2017-08-22 DIAGNOSIS — I872 Venous insufficiency (chronic) (peripheral): Secondary | ICD-10-CM | POA: Diagnosis not present

## 2017-08-22 DIAGNOSIS — Z885 Allergy status to narcotic agent status: Secondary | ICD-10-CM | POA: Diagnosis not present

## 2017-08-22 DIAGNOSIS — M1811 Unilateral primary osteoarthritis of first carpometacarpal joint, right hand: Secondary | ICD-10-CM | POA: Diagnosis not present

## 2017-08-22 DIAGNOSIS — M25341 Other instability, right hand: Secondary | ICD-10-CM | POA: Diagnosis not present

## 2017-08-22 DIAGNOSIS — Z888 Allergy status to other drugs, medicaments and biological substances status: Secondary | ICD-10-CM | POA: Diagnosis not present

## 2017-08-22 DIAGNOSIS — G8918 Other acute postprocedural pain: Secondary | ICD-10-CM | POA: Diagnosis not present

## 2017-08-22 DIAGNOSIS — M65311 Trigger thumb, right thumb: Secondary | ICD-10-CM | POA: Diagnosis not present

## 2017-08-22 DIAGNOSIS — Z882 Allergy status to sulfonamides status: Secondary | ICD-10-CM | POA: Diagnosis not present

## 2017-08-22 DIAGNOSIS — M19041 Primary osteoarthritis, right hand: Secondary | ICD-10-CM | POA: Diagnosis not present

## 2017-08-31 DIAGNOSIS — M659 Synovitis and tenosynovitis, unspecified: Secondary | ICD-10-CM | POA: Diagnosis not present

## 2017-08-31 DIAGNOSIS — M1811 Unilateral primary osteoarthritis of first carpometacarpal joint, right hand: Secondary | ICD-10-CM | POA: Diagnosis not present

## 2017-08-31 DIAGNOSIS — M65311 Trigger thumb, right thumb: Secondary | ICD-10-CM | POA: Diagnosis not present

## 2017-08-31 DIAGNOSIS — Z4789 Encounter for other orthopedic aftercare: Secondary | ICD-10-CM | POA: Diagnosis not present

## 2017-09-10 DIAGNOSIS — Z4789 Encounter for other orthopedic aftercare: Secondary | ICD-10-CM | POA: Diagnosis not present

## 2017-09-10 DIAGNOSIS — M1811 Unilateral primary osteoarthritis of first carpometacarpal joint, right hand: Secondary | ICD-10-CM | POA: Diagnosis not present

## 2017-09-19 DIAGNOSIS — Z888 Allergy status to other drugs, medicaments and biological substances status: Secondary | ICD-10-CM | POA: Diagnosis not present

## 2017-09-19 DIAGNOSIS — Z886 Allergy status to analgesic agent status: Secondary | ICD-10-CM | POA: Diagnosis not present

## 2017-09-19 DIAGNOSIS — Z4789 Encounter for other orthopedic aftercare: Secondary | ICD-10-CM | POA: Diagnosis not present

## 2017-09-19 DIAGNOSIS — Z91040 Latex allergy status: Secondary | ICD-10-CM | POA: Diagnosis not present

## 2017-09-19 DIAGNOSIS — Z885 Allergy status to narcotic agent status: Secondary | ICD-10-CM | POA: Diagnosis not present

## 2017-09-19 DIAGNOSIS — M1811 Unilateral primary osteoarthritis of first carpometacarpal joint, right hand: Secondary | ICD-10-CM | POA: Diagnosis not present

## 2017-09-19 DIAGNOSIS — Z882 Allergy status to sulfonamides status: Secondary | ICD-10-CM | POA: Diagnosis not present

## 2017-09-20 ENCOUNTER — Other Ambulatory Visit: Payer: Self-pay | Admitting: Family Medicine

## 2017-09-20 DIAGNOSIS — G8929 Other chronic pain: Secondary | ICD-10-CM

## 2017-09-20 DIAGNOSIS — G9050 Complex regional pain syndrome I, unspecified: Secondary | ICD-10-CM

## 2017-09-21 NOTE — Telephone Encounter (Signed)
Last OV and Rx 06/29/2017 #240 1R.

## 2017-09-26 ENCOUNTER — Ambulatory Visit (INDEPENDENT_AMBULATORY_CARE_PROVIDER_SITE_OTHER): Payer: Medicare HMO | Admitting: Family Medicine

## 2017-09-26 ENCOUNTER — Encounter: Payer: Self-pay | Admitting: Family Medicine

## 2017-09-26 VITALS — BP 128/78 | HR 66 | Temp 97.8°F | Wt 182.2 lb

## 2017-09-26 DIAGNOSIS — G8929 Other chronic pain: Secondary | ICD-10-CM | POA: Diagnosis not present

## 2017-09-26 DIAGNOSIS — G9050 Complex regional pain syndrome I, unspecified: Secondary | ICD-10-CM | POA: Diagnosis not present

## 2017-09-26 MED ORDER — HYDROCODONE-ACETAMINOPHEN 5-325 MG PO TABS: 1.0000 | ORAL_TABLET | Freq: Four times a day (QID) | ORAL | 0 refills | Status: DC | PRN

## 2017-09-26 MED ORDER — HYDROCODONE-ACETAMINOPHEN 5-325 MG PO TABS: ORAL_TABLET | ORAL | 0 refills | Status: DC

## 2017-09-26 NOTE — Progress Notes (Signed)
Subjective:    Patient ID: Jessica Shelton, female    DOB: March 23, 1961, 57 y.o.   MRN: [id number]  HPI This is a 57 yo female who presents today follow up of chronic pain.  She had surgery on her right thumb last month and has been in a cast ever since. She has 3 temporary pins. Had a pain pump post operatively. Did not need to increase narcotic pain medications. Was given additional prescription for pain medication from surgeon but did not need to have filled. She denies chest pain, SOB, nausea/vomiting, constipation.   Poor sleep which is chronic. Naps when she can.   She and her husband are raising 3 grandchildren, stressful situation with mother of the children.   Past Medical History:  Diagnosis Date  . GERD (gastroesophageal reflux disease)   . Pain syndrome, chronic    complex region   Past Surgical History:  Procedure Laterality Date  . CARPAL TUNNEL RELEASE  01/2006  . FOOT SURGERY Left 06/18/2015   [facility]  . HAND SURGERY     left  . ROTATOR CUFF REPAIR  08/2000  . thumb surgery     pollicized left   No family history on file. Social History   Tobacco Use  . Smoking status: Never Smoker  . Smokeless tobacco: Never Used  Substance Use Topics  . Alcohol use: No    Alcohol/week: 0.0 oz  . Drug use: No      Review of Systems Per HPI    Objective:   Physical Exam  Constitutional: She is oriented to person, place, and time. She appears well-developed and well-nourished.  HENT:  Head: Normocephalic and atraumatic.  Eyes: Conjunctivae are normal.  Cardiovascular: Normal rate, regular rhythm and normal heart sounds.  Pulmonary/Chest: Effort normal and breath sounds normal.  Musculoskeletal:  Right wrist/forearm cast intact. Fingers without edema or discoloration.   Neurological: She is alert and oriented to person, place, and time.  Skin: Skin is warm and dry.  Psychiatric: She has a normal mood and affect. Her behavior is normal. Judgment and  thought content normal.  Vitals reviewed.     BP 128/78   Pulse 66   Temp 97.8 F (36.6 C) (Oral)   Wt 182 lb 4 oz (82.7 kg)   SpO2 96%   BMI 31.28 kg/m      Assessment & Plan:  1. PAIN, CHRONIC NEC - Indication for chronic opioid: reflex sympathetic dystrophy, polydactyly, chronic pain Medication and dose: hydrocodone 5-325 mg 1-2 every 6 hours prn # pills per month: 180 Last UDS date: 11/08/16, will obtain at next visit Opioid Treatment Agreement signed (Y/N): yes Opioid Treatment Agreement last reviewed with patient:  06/29/17 NCCSRS reviewed this encounter (include red flags):  yes, no red flags noted, no prescriptions dispensed from any other provider - follow up in 3 months  - HYDROcodone-acetaminophen (NORCO/VICODIN) 5-325 MG tablet; Take 1-2 tablets by mouth every 6 (six) hours as needed for moderate pain. May fill 60 days after date on prescription  Dispense: 180 tablet; Refill: 0 - HYDROcodone-acetaminophen (NORCO/VICODIN) 5-325 MG tablet; Take 1-2 tablets by mouth every 6 (six) hours as needed for moderate pain. May fill 30 days after date on prescription  Dispense: 180 tablet; Refill: 0 - HYDROcodone-acetaminophen (NORCO/VICODIN) 5-325 MG tablet; TAKE 1 TO 2 TABLETS BY MOUTH EVERY SIX HOURS AS NEEDED FOR PAIN  Dispense: 180 tablet; Refill: 0  2. Reflex sympathetic dystrophy - HYDROcodone-acetaminophen (NORCO/VICODIN) 5-325 MG tablet; Take 1-2  tablets by mouth every 6 (six) hours as needed for moderate pain. May fill 60 days after date on prescription  Dispense: 180 tablet; Refill: 0 - HYDROcodone-acetaminophen (NORCO/VICODIN) 5-325 MG tablet; Take 1-2 tablets by mouth every 6 (six) hours as needed for moderate pain. May fill 30 days after date on prescription  Dispense: 180 tablet; Refill: 0 - HYDROcodone-acetaminophen (NORCO/VICODIN) 5-325 MG tablet; TAKE 1 TO 2 TABLETS BY MOUTH EVERY SIX HOURS AS NEEDED FOR PAIN  Dispense: 180 tablet; Refill: 0   Olean Ree,  [name]  [facility], [facility]  09/26/2017 12:09 PM

## 2017-09-26 NOTE — Patient Instructions (Signed)
Good to see you today  Please schedule a follow up visit in 3 months

## 2017-09-28 DIAGNOSIS — M659 Synovitis and tenosynovitis, unspecified: Secondary | ICD-10-CM | POA: Diagnosis not present

## 2017-09-28 DIAGNOSIS — M65311 Trigger thumb, right thumb: Secondary | ICD-10-CM | POA: Diagnosis not present

## 2017-09-28 DIAGNOSIS — M8588 Other specified disorders of bone density and structure, other site: Secondary | ICD-10-CM | POA: Diagnosis not present

## 2017-09-28 DIAGNOSIS — M1811 Unilateral primary osteoarthritis of first carpometacarpal joint, right hand: Secondary | ICD-10-CM | POA: Diagnosis not present

## 2017-09-28 DIAGNOSIS — M24631 Ankylosis, right wrist: Secondary | ICD-10-CM | POA: Diagnosis not present

## 2017-09-28 DIAGNOSIS — M19041 Primary osteoarthritis, right hand: Secondary | ICD-10-CM | POA: Diagnosis not present

## 2017-09-28 DIAGNOSIS — Z981 Arthrodesis status: Secondary | ICD-10-CM | POA: Diagnosis not present

## 2017-09-30 ENCOUNTER — Encounter: Payer: Self-pay | Admitting: Family Medicine

## 2017-09-30 DIAGNOSIS — Q740 Other congenital malformations of upper limb(s), including shoulder girdle: Secondary | ICD-10-CM | POA: Insufficient documentation

## 2017-09-30 DIAGNOSIS — R262 Difficulty in walking, not elsewhere classified: Secondary | ICD-10-CM | POA: Insufficient documentation

## 2017-09-30 DIAGNOSIS — M1812 Unilateral primary osteoarthritis of first carpometacarpal joint, left hand: Secondary | ICD-10-CM | POA: Insufficient documentation

## 2017-09-30 DIAGNOSIS — G5620 Lesion of ulnar nerve, unspecified upper limb: Secondary | ICD-10-CM | POA: Insufficient documentation

## 2017-10-15 DIAGNOSIS — M8580 Other specified disorders of bone density and structure, unspecified site: Secondary | ICD-10-CM | POA: Diagnosis not present

## 2017-10-15 DIAGNOSIS — Q699 Polydactyly, unspecified: Secondary | ICD-10-CM | POA: Diagnosis not present

## 2017-10-15 DIAGNOSIS — M1811 Unilateral primary osteoarthritis of first carpometacarpal joint, right hand: Secondary | ICD-10-CM | POA: Diagnosis not present

## 2017-10-15 DIAGNOSIS — Z9889 Other specified postprocedural states: Secondary | ICD-10-CM | POA: Diagnosis not present

## 2017-10-15 DIAGNOSIS — Z4789 Encounter for other orthopedic aftercare: Secondary | ICD-10-CM | POA: Diagnosis not present

## 2017-10-15 DIAGNOSIS — Z981 Arthrodesis status: Secondary | ICD-10-CM | POA: Diagnosis not present

## 2017-10-30 ENCOUNTER — Other Ambulatory Visit: Payer: Self-pay | Admitting: Family Medicine

## 2017-10-30 DIAGNOSIS — G9050 Complex regional pain syndrome I, unspecified: Secondary | ICD-10-CM

## 2017-10-30 DIAGNOSIS — G8929 Other chronic pain: Secondary | ICD-10-CM

## 2017-10-31 NOTE — Telephone Encounter (Signed)
Last filled 09/22/17 #240 refills 0 Last OV 09/26/17

## 2017-11-05 ENCOUNTER — Ambulatory Visit (INDEPENDENT_AMBULATORY_CARE_PROVIDER_SITE_OTHER): Payer: Medicare HMO | Admitting: Family Medicine

## 2017-11-05 ENCOUNTER — Encounter: Payer: Self-pay | Admitting: Family Medicine

## 2017-11-05 VITALS — BP 96/68 | HR 63 | Temp 98.4°F | Ht 64.0 in | Wt 180.5 lb

## 2017-11-05 DIAGNOSIS — W57XXXA Bitten or stung by nonvenomous insect and other nonvenomous arthropods, initial encounter: Secondary | ICD-10-CM | POA: Insufficient documentation

## 2017-11-05 DIAGNOSIS — S90862A Insect bite (nonvenomous), left foot, initial encounter: Secondary | ICD-10-CM | POA: Diagnosis not present

## 2017-11-05 MED ORDER — DOXYCYCLINE HYCLATE 100 MG PO TABS: 100.0000 mg | ORAL_TABLET | Freq: Two times a day (BID) | ORAL | 0 refills | Status: DC

## 2017-11-05 NOTE — Patient Instructions (Signed)
Keep the bite clean with soap and water  Elevate when sitting  Watch for rash/fever or new symptoms  You can use antibiotic ointment if you want to  Cortisone cream will help itch / cold compress helps also   If the bite gets larger/more red or more swollen -let us know   Use your insect repellent

## 2017-11-05 NOTE — Progress Notes (Signed)
Subjective:    Patient ID: Jessica Shelton, female    DOB: 25-May-1961, 57 y.o.   MRN: [id number]  HPI Here to check a tick bite   A week ago Saturday  Took it off  Quite sore and itchy (still itchy)   On L ankle  Bite is red and swollen  Got pus out of it over the weekend  (baseline swelling-this is more swollen than usual)  Warm to the touch   Tried epsom salts Alcohol and peroxide   No fever  No headache or sore throat  No rash   No red/swollen joints that are new    Patient Active Problem List   Diagnosis Date Noted  . Tick bite of left foot 11/05/2017  . Congenital dislocation of radial head 09/30/2017  . Difficulty walking involving foot 09/30/2017  . Arthritis of carpometacarpal [facility]) joint of left thumb 09/30/2017  . Lesion of ulnar nerve 09/30/2017  . Mass of right inguinal region 04/25/2017  . Well woman exam 04/12/2016  . Pain from implanted hardware 12/14/2015  . Acquired posterior equinus of left lower extremity 06/03/2015  . Medicare annual wellness visit, subsequent 03/16/2015  . Acquired pes planovalgus of left foot 02/09/2015  . Arthrosis of left midfoot 02/09/2015  . Myofascial pain 01/19/2014  . Lumbar facet joint pain 11/17/2013  . Pseudarthrosis after fusion or arthrodesis 06/30/2013  . Hip bursitis 06/02/2012  . Cubital tunnel syndrome on right 04/09/2012  . Apophysitis of iliac crest 10/25/2011  . Neuropathy of upper extremity 08/17/2011  . Other lesion of median nerve 07/12/2011  . Congenital deformity of foot 04/26/2011  . Degeneration of lumbar or lumbosacral intervertebral disc 04/26/2011  . Neurogenic thoracic outlet syndrome 04/26/2011  . Pain 04/06/2011  . UNSPECIFIED DISORDER OF KIDNEY AND URETER 03/21/2010  . VITAMIN D DEFICIENCY 12/25/2007  . Anemia 12/25/2007  . Reflex sympathetic dystrophy 04/15/2007  . Osteoarthritis of spine 04/15/2007  . PAIN, CHRONIC NEC 04/09/2007  . CARPAL TUNNEL SYNDROME 04/09/2007  . STRICTURE,  ESOPHAGEAL 04/09/2007  . POLYDACTYLY, HANDS 04/09/2007   Past Medical History:  Diagnosis Date  . GERD (gastroesophageal reflux disease)   . Pain syndrome, chronic    complex region   Past Surgical History:  Procedure Laterality Date  . CARPAL TUNNEL RELEASE  01/2006  . FOOT SURGERY Left 06/18/2015   [facility]  . HAND SURGERY     left  . ROTATOR CUFF REPAIR  08/2000  . thumb surgery     pollicized left   Social History   Tobacco Use  . Smoking status: Never Smoker  . Smokeless tobacco: Never Used  Substance Use Topics  . Alcohol use: No    Alcohol/week: 0.0 oz  . Drug use: No   History reviewed. No pertinent family history. Allergies  Allergen Reactions  . Hydromorphone Hcl Anaphylaxis and Itching  . Latex   . Oxycodone-Acetaminophen Anaphylaxis, Rash and Swelling  . Clonidine Hydrochloride   . Codeine     REACTION: anaphylaxis  . Dilaudid [Hydromorphone Hcl] Itching and Dermatitis  . Morphine     REACTION: anaphylaxis  . Oxycodone-Acetaminophen     REACTION: anaphylaxis  . Prednisone     REACTION: difficulty breathing  . Sulfamethoxazole-Trimethoprim     REACTION: rash  . Sulfonamide Derivatives     REACTION: urticaria (hives)   Current Outpatient Medications on File Prior to Visit  Medication Sig Dispense Refill  . albuterol (PROVENTIL,VENTOLIN) 90 MCG/ACT inhaler Inhale 2 puffs into the lungs  every 6 (six) hours as needed.      . hydrochlorothiazide (HYDRODIURIL) 25 MG tablet TAKE 1/2 TO 1 TABLET BY MOUTH ONCE A DAYAS NEEDED FOR SWELLING 90 tablet 3  . HYDROcodone-acetaminophen (NORCO/VICODIN) 5-325 MG tablet Take 1-2 tablets by mouth every 6 (six) hours as needed for moderate pain. May fill 60 days after date on prescription 180 tablet 0  . HYDROcodone-acetaminophen (NORCO/VICODIN) 5-325 MG tablet Take 1-2 tablets by mouth every 6 (six) hours as needed for moderate pain. May fill 30 days after date on prescription 180 tablet 0  .  HYDROcodone-acetaminophen (NORCO/VICODIN) 5-325 MG tablet TAKE 1 TO 2 TABLETS BY MOUTH EVERY SIX HOURS AS NEEDED FOR PAIN 180 tablet 0  . lidocaine (LIDODERM) 5 % 1-2 patches on effected area, leave on for no longer than 12 hours     . tiZANidine (ZANAFLEX) 2 MG tablet Take 1 tablet (2 mg total) by mouth 2 (two) times daily. 180 tablet 3  . traMADol (ULTRAM) 50 MG tablet TAKE 1 TO 2 TABLETS BY MOUTH EVERY 6 HOURS AS NEEDED 240 tablet 0   No current facility-administered medications on file prior to visit.     Review of Systems  Constitutional: Negative for activity change, appetite change, fatigue, fever and unexpected weight change.  HENT: Negative for congestion, ear pain, rhinorrhea, sinus pressure and sore throat.   Eyes: Negative for pain, redness and visual disturbance.  Respiratory: Negative for cough, shortness of breath and wheezing.   Cardiovascular: Negative for chest pain and palpitations.  Gastrointestinal: Negative for abdominal pain, blood in stool, constipation and diarrhea.  Endocrine: Negative for polydipsia and polyuria.  Genitourinary: Negative for dysuria, frequency and urgency.  Musculoskeletal: Negative for arthralgias, back pain and myalgias.  Skin: Positive for wound. Negative for pallor and rash.  Allergic/Immunologic: Negative for environmental allergies.  Neurological: Negative for dizziness, syncope and headaches.  Hematological: Negative for adenopathy. Does not bruise/bleed easily.  Psychiatric/Behavioral: Negative for decreased concentration and dysphoric mood. The patient is not nervous/anxious.        Objective:   Physical Exam  Constitutional: She appears well-developed and well-nourished. No distress.  Well appearing   HENT:  Head: Normocephalic and atraumatic.  Eyes: Pupils are equal, round, and reactive to light. Conjunctivae and EOM are normal.  Neck: Normal range of motion. Neck supple.  Cardiovascular: Normal rate, regular rhythm and normal  heart sounds.  Pulmonary/Chest: Effort normal and breath sounds normal.  Musculoskeletal: She exhibits edema and tenderness.  Acute on chronic ankle edema  Slight tender over bite on L ankle (joint is not swollen however and baseline rom)  Lymphadenopathy:    She has no cervical adenopathy.  Neurological: She is alert. No cranial nerve deficit. Coordination normal.  Skin: Skin is warm and dry. There is erythema.  Oval area (diam 5 cm) of erythema and induration medial L ankle with tiny scab in middle No bullseye appearance  No tick parts   No rash   Psychiatric: She has a normal mood and affect.  Pleasant           Assessment & Plan:   Problem List Items Addressed This Visit      Musculoskeletal and Integument   Tick bite of left foot - Primary    L medial ankle Red/swollen/uncomfortable No bullseye app and no s/s of tick fever (pt was worried about local infection)  tx with doxycycline bid 10 d  Soap and water cleanse  otc cortisone cream  Alert if worse /  or if any s/s of tick dz (reviewed)  See avs Update if not starting to improve in a week or if worsening

## 2017-11-05 NOTE — Assessment & Plan Note (Signed)
L medial ankle Red/swollen/uncomfortable No bullseye app and no s/s of tick fever (pt was worried about local infection)  tx with doxycycline bid 10 d  Soap and water cleanse  otc cortisone cream  Alert if worse / or if any s/s of tick dz (reviewed)  See avs Update if not starting to improve in a week or if worsening

## 2017-11-19 DIAGNOSIS — M1811 Unilateral primary osteoarthritis of first carpometacarpal joint, right hand: Secondary | ICD-10-CM | POA: Diagnosis not present

## 2017-11-19 DIAGNOSIS — M19041 Primary osteoarthritis, right hand: Secondary | ICD-10-CM | POA: Diagnosis not present

## 2017-11-19 DIAGNOSIS — Z981 Arthrodesis status: Secondary | ICD-10-CM | POA: Diagnosis not present

## 2017-11-19 DIAGNOSIS — M1812 Unilateral primary osteoarthritis of first carpometacarpal joint, left hand: Secondary | ICD-10-CM | POA: Diagnosis not present

## 2017-11-19 DIAGNOSIS — M8580 Other specified disorders of bone density and structure, unspecified site: Secondary | ICD-10-CM | POA: Diagnosis not present

## 2017-12-03 ENCOUNTER — Other Ambulatory Visit: Payer: Self-pay | Admitting: Family Medicine

## 2017-12-03 DIAGNOSIS — G8929 Other chronic pain: Secondary | ICD-10-CM

## 2017-12-03 DIAGNOSIS — G9050 Complex regional pain syndrome I, unspecified: Secondary | ICD-10-CM

## 2017-12-03 NOTE — Telephone Encounter (Signed)
Last filled 10/31/17 #240 refills 0  Last OV 09/26/17

## 2017-12-26 ENCOUNTER — Encounter: Payer: Self-pay | Admitting: Emergency Medicine

## 2017-12-26 ENCOUNTER — Ambulatory Visit (INDEPENDENT_AMBULATORY_CARE_PROVIDER_SITE_OTHER): Payer: Medicare HMO | Admitting: Family Medicine

## 2017-12-26 ENCOUNTER — Encounter: Payer: Self-pay | Admitting: Family Medicine

## 2017-12-26 VITALS — BP 114/70 | HR 59 | Temp 98.3°F | Ht 64.0 in | Wt 183.8 lb

## 2017-12-26 DIAGNOSIS — G9050 Complex regional pain syndrome I, unspecified: Secondary | ICD-10-CM

## 2017-12-26 DIAGNOSIS — M7989 Other specified soft tissue disorders: Secondary | ICD-10-CM | POA: Diagnosis not present

## 2017-12-26 DIAGNOSIS — G8929 Other chronic pain: Secondary | ICD-10-CM | POA: Diagnosis not present

## 2017-12-26 MED ORDER — TRAMADOL HCL 50 MG PO TABS: ORAL_TABLET | ORAL | 0 refills | Status: DC

## 2017-12-26 MED ORDER — HYDROCHLOROTHIAZIDE 25 MG PO TABS: 25.0000 mg | ORAL_TABLET | Freq: Every day | ORAL | 3 refills | Status: DC

## 2017-12-26 MED ORDER — HYDROCODONE-ACETAMINOPHEN 5-325 MG PO TABS: ORAL_TABLET | ORAL | 0 refills | Status: DC

## 2017-12-26 MED ORDER — HYDROCODONE-ACETAMINOPHEN 5-325 MG PO TABS: 1.0000 | ORAL_TABLET | Freq: Four times a day (QID) | ORAL | 0 refills | Status: DC | PRN

## 2017-12-26 MED ORDER — TIZANIDINE HCL 2 MG PO TABS: 2.0000 mg | ORAL_TABLET | Freq: Two times a day (BID) | ORAL | 3 refills | Status: DC

## 2017-12-26 NOTE — Progress Notes (Signed)
Subjective:    Patient ID: Jessica Shelton, female    DOB: 09-14-1960, 57 y.o.   MRN: [id number]  HPI This is a 57 yo female who presents today for chronic pain management.   Has noticed increased swelling in legs. Taking HCTZ 25 mg daily and about 3 times a week will take 2 tabs. Doesn't have any dizziness or lightheadedness. Good relief of swelling with increased dose.   May need additional surgery on right hand, not healing well.   Denies sedation, constipation, depression. Continues to take care of her grandchildren, ongoing difficulty with children's mother. Some increased stress, feels that she is coping well. Children are in counseling.   Past Medical History:  Diagnosis Date  . GERD (gastroesophageal reflux disease)   . Pain syndrome, chronic    complex region   Past Surgical History:  Procedure Laterality Date  . CARPAL TUNNEL RELEASE  01/2006  . FOOT SURGERY Left 06/18/2015   [facility]  . HAND SURGERY     left  . ROTATOR CUFF REPAIR  08/2000  . thumb surgery     pollicized left   No family history on file. Social History   Tobacco Use  . Smoking status: Never Smoker  . Smokeless tobacco: Never Used  Substance Use Topics  . Alcohol use: No    Alcohol/week: 0.0 oz  . Drug use: No      Review of Systems Per HPI    Objective:   Physical Exam  Constitutional: She is oriented to person, place, and time. She appears well-developed and well-nourished. No distress.  HENT:  Head: Normocephalic and atraumatic.  Eyes: Conjunctivae are normal.  Cardiovascular: Normal rate.  Pulmonary/Chest: Effort normal.  Neurological: She is alert and oriented to person, place, and time.  Skin: Skin is warm and dry. She is not diaphoretic.  Psychiatric: She has a normal mood and affect. Her behavior is normal. Judgment and thought content normal.  Vitals reviewed.     BP 114/70 (BP Location: Left Arm, Patient Position: Sitting, Cuff Size: Normal)   Pulse (!)  59   Temp 98.3 F (36.8 C) (Oral)   Ht 5\' 4"  (1.626 m)   Wt 183 lb 12 oz (83.3 kg)   SpO2 96%   BMI 31.54 kg/m  Wt Readings from Last 3 Encounters:  12/26/17 183 lb 12 oz (83.3 kg)  11/05/17 180 lb 8 oz (81.9 kg)  09/26/17 182 lb 4 oz (82.7 kg)   Indication for chronic opioid: Reflex sympathetic dystrophy, neurogenic thoracic outlet syndrome Medication and dose: hydrocodone-acetaminophen 5-325, 1-2 tabs q 6 prn, tramadol 50 mg, 1-2 tabs q 6 prn Last UDS date: 5/18, will provide sample today Opioid Treatment Agreement signed (Y/N): updated today Opioid Treatment Agreement last reviewed with patient:  today NCCSRS reviewed this encounter (include red flags):  Reviewed, no red flags, all prescriptions filled at same pharmacy, no other providers     Assessment & Plan:  1. Encounter for chronic pain management - Pain Mgmt, Profile 8 w/Conf, U - follow up in 3 months for CPE/labs  2. PAIN, CHRONIC NEC - tiZANidine (ZANAFLEX) 2 MG tablet; Take 1 tablet (2 mg total) by mouth 2 (two) times daily.  Dispense: 180 tablet; Refill: 3 - HYDROcodone-acetaminophen (NORCO/VICODIN) 5-325 MG tablet; TAKE 1 TO 2 TABLETS BY MOUTH EVERY SIX HOURS AS NEEDED FOR PAIN  Dispense: 180 tablet; Refill: 0 - HYDROcodone-acetaminophen (NORCO/VICODIN) 5-325 MG tablet; Take 1-2 tablets by mouth every 6 (six) hours as  needed for moderate pain. May fill 60 days after date on prescription  Dispense: 180 tablet; Refill: 0 - HYDROcodone-acetaminophen (NORCO/VICODIN) 5-325 MG tablet; Take 1-2 tablets by mouth every 6 (six) hours as needed for moderate pain. May fill 30 days after date on prescription  Dispense: 180 tablet; Refill: 0 - traMADol (ULTRAM) 50 MG tablet; TAKE 1 OR 2 TABLETS  BY MOUTH EVERY 6 HOURS AS NEEDED  Dispense: 240 tablet; Refill: 0  3. Reflex sympathetic dystrophy - tiZANidine (ZANAFLEX) 2 MG tablet; Take 1 tablet (2 mg total) by mouth 2 (two) times daily.  Dispense: 180 tablet; Refill: 3 -  HYDROcodone-acetaminophen (NORCO/VICODIN) 5-325 MG tablet; TAKE 1 TO 2 TABLETS BY MOUTH EVERY SIX HOURS AS NEEDED FOR PAIN  Dispense: 180 tablet; Refill: 0 - HYDROcodone-acetaminophen (NORCO/VICODIN) 5-325 MG tablet; Take 1-2 tablets by mouth every 6 (six) hours as needed for moderate pain. May fill 60 days after date on prescription  Dispense: 180 tablet; Refill: 0 - HYDROcodone-acetaminophen (NORCO/VICODIN) 5-325 MG tablet; Take 1-2 tablets by mouth every 6 (six) hours as needed for moderate pain. May fill 30 days after date on prescription  Dispense: 180 tablet; Refill: 0 - traMADol (ULTRAM) 50 MG tablet; TAKE 1 OR 2 TABLETS  BY MOUTH EVERY 6 HOURS AS NEEDED  Dispense: 240 tablet; Refill: 0  4. Leg swelling - Can increase HCTZ 25 mg to 50 mg prn, will check labs at next visit - encouraged good fluid intake and elevate feet when possible   Olean Reeeborah Gessner, [name]  [facility], [facility]  12/28/2017 7:18 PM

## 2017-12-26 NOTE — Progress Notes (Signed)
m,m

## 2017-12-26 NOTE — Patient Instructions (Addendum)
Ask if you should have a bone density study- they can order or I can  See you in 3 months, let me know if swelling worsening

## 2017-12-28 ENCOUNTER — Encounter: Payer: Self-pay | Admitting: Family Medicine

## 2017-12-28 ENCOUNTER — Other Ambulatory Visit: Payer: Self-pay | Admitting: Emergency Medicine

## 2017-12-28 DIAGNOSIS — M7989 Other specified soft tissue disorders: Secondary | ICD-10-CM

## 2017-12-28 MED ORDER — HYDROCHLOROTHIAZIDE 25 MG PO TABS: 25.0000 mg | ORAL_TABLET | Freq: Every day | ORAL | 3 refills | Status: DC

## 2017-12-29 LAB — PAIN MGMT, PROFILE 8 W/CONF, U
6 Acetylmorphine: NEGATIVE ng/mL (ref <10)
ALCOHOL METABOLITES: NEGATIVE ng/mL (ref <500)
Amphetamines: NEGATIVE ng/mL (ref <500)
BUPRENORPHINE, URINE: NEGATIVE ng/mL (ref <5)
Benzodiazepines: NEGATIVE ng/mL (ref <100)
Buprenorphine: NEGATIVE ng/mL (ref <2)
Cocaine Metabolite: NEGATIVE ng/mL (ref <150)
Codeine: NEGATIVE ng/mL (ref <50)
Creatinine: 43.6 mg/dL
HYDROCODONE: 137 ng/mL — AB (ref <50)
Hydromorphone: 53 ng/mL — ABNORMAL HIGH (ref <50)
MARIJUANA METABOLITE: NEGATIVE ng/mL (ref <20)
MDMA: NEGATIVE ng/mL (ref <500)
MORPHINE: NEGATIVE ng/mL (ref <50)
NORHYDROCODONE: 341 ng/mL — AB (ref <50)
Norbuprenorphine: NEGATIVE ng/mL (ref <2)
OXIDANT: NEGATIVE ug/mL (ref <200)
Opiates: POSITIVE ng/mL — AB (ref <100)
Oxycodone: NEGATIVE ng/mL (ref <100)
PH: 6.68 (ref 4.5–9.0)

## 2017-12-31 ENCOUNTER — Telehealth: Payer: Self-pay | Admitting: Family Medicine

## 2017-12-31 DIAGNOSIS — M65311 Trigger thumb, right thumb: Secondary | ICD-10-CM | POA: Diagnosis not present

## 2017-12-31 DIAGNOSIS — Z981 Arthrodesis status: Secondary | ICD-10-CM | POA: Diagnosis not present

## 2017-12-31 DIAGNOSIS — Z9889 Other specified postprocedural states: Secondary | ICD-10-CM | POA: Diagnosis not present

## 2017-12-31 DIAGNOSIS — M1811 Unilateral primary osteoarthritis of first carpometacarpal joint, right hand: Secondary | ICD-10-CM | POA: Diagnosis not present

## 2017-12-31 DIAGNOSIS — M19041 Primary osteoarthritis, right hand: Secondary | ICD-10-CM | POA: Diagnosis not present

## 2017-12-31 DIAGNOSIS — M1812 Unilateral primary osteoarthritis of first carpometacarpal joint, left hand: Secondary | ICD-10-CM | POA: Diagnosis not present

## 2017-12-31 NOTE — Telephone Encounter (Signed)
Called and spoke with patient informing her that prescription was resent electronically with correct order on 12/28/17.

## 2017-12-31 NOTE — Telephone Encounter (Signed)
Copied from CRM [id number]. Topic: Quick Communication - Rx Refill/Question >> Dec 31, 2017  3:58 PM Burchel, Abbi R wrote: Medication: hydrochlorothiazide (HYDRODIURIL) 25 MG tablet   Preferred Pharmacy:  [facility] [place] - [id number]  [phone] (Phone) [phone] (Fax)  Pt states that [facility] regarding this Rx. Pt stated [employer] will not fill th is Rx until it is completed. Please call pt when paperwork is completed and [facility] to [facility].  Pt: [phone]

## 2018-01-01 DIAGNOSIS — M19072 Primary osteoarthritis, left ankle and foot: Secondary | ICD-10-CM | POA: Diagnosis not present

## 2018-01-01 DIAGNOSIS — M25572 Pain in left ankle and joints of left foot: Secondary | ICD-10-CM | POA: Diagnosis not present

## 2018-01-01 DIAGNOSIS — M21072 Valgus deformity, not elsewhere classified, left ankle: Secondary | ICD-10-CM | POA: Diagnosis not present

## 2018-01-01 DIAGNOSIS — M85872 Other specified disorders of bone density and structure, left ankle and foot: Secondary | ICD-10-CM | POA: Diagnosis not present

## 2018-01-01 DIAGNOSIS — M25775 Osteophyte, left foot: Secondary | ICD-10-CM | POA: Diagnosis not present

## 2018-01-01 DIAGNOSIS — M25475 Effusion, left foot: Secondary | ICD-10-CM | POA: Diagnosis not present

## 2018-01-01 DIAGNOSIS — M2142 Flat foot [pes planus] (acquired), left foot: Secondary | ICD-10-CM | POA: Diagnosis not present

## 2018-01-01 DIAGNOSIS — Z8781 Personal history of (healed) traumatic fracture: Secondary | ICD-10-CM | POA: Diagnosis not present

## 2018-01-01 DIAGNOSIS — M71372 Other bursal cyst, left ankle and foot: Secondary | ICD-10-CM | POA: Diagnosis not present

## 2018-01-01 DIAGNOSIS — M21532 Acquired clawfoot, left foot: Secondary | ICD-10-CM | POA: Diagnosis not present

## 2018-02-07 ENCOUNTER — Other Ambulatory Visit: Payer: Self-pay | Admitting: Family Medicine

## 2018-02-07 DIAGNOSIS — G9050 Complex regional pain syndrome I, unspecified: Secondary | ICD-10-CM

## 2018-02-07 DIAGNOSIS — G8929 Other chronic pain: Secondary | ICD-10-CM

## 2018-02-11 NOTE — Telephone Encounter (Signed)
Last filled 12/26/17 # 240 refills 0  Last OV 12/26/17

## 2018-03-06 DIAGNOSIS — M719 Bursopathy, unspecified: Secondary | ICD-10-CM | POA: Diagnosis not present

## 2018-03-06 DIAGNOSIS — G5770 Causalgia of unspecified lower limb: Secondary | ICD-10-CM | POA: Diagnosis not present

## 2018-03-06 DIAGNOSIS — Z79891 Long term (current) use of opiate analgesic: Secondary | ICD-10-CM | POA: Diagnosis not present

## 2018-03-06 DIAGNOSIS — Z79899 Other long term (current) drug therapy: Secondary | ICD-10-CM | POA: Diagnosis not present

## 2018-03-06 DIAGNOSIS — M71372 Other bursal cyst, left ankle and foot: Secondary | ICD-10-CM | POA: Diagnosis not present

## 2018-03-26 DIAGNOSIS — M71372 Other bursal cyst, left ankle and foot: Secondary | ICD-10-CM | POA: Diagnosis not present

## 2018-03-26 DIAGNOSIS — Z48817 Encounter for surgical aftercare following surgery on the skin and subcutaneous tissue: Secondary | ICD-10-CM | POA: Diagnosis not present

## 2018-04-05 ENCOUNTER — Telehealth: Payer: Self-pay

## 2018-04-05 NOTE — Telephone Encounter (Signed)
Called and spoke with Ladona Ridgel. Per Para March okay to fill prescription. Understanding verbalized nothing further needed at this time.

## 2018-04-05 NOTE — Telephone Encounter (Signed)
Please call pharmacy back.  If she has valid rx on file there, then it is okay with me to fill since it isn't early.  Thanks.

## 2018-04-05 NOTE — Telephone Encounter (Signed)
Jessica Shelton with [facility] left v/m wanting to know if OK to fill hydrocodone apap 5 -325; instructions on 12/26/17 rx have may fill 60 days after date on prescription.  Pt last got refill on 02/19/18 for 23 day supply. On pts med list there are 3 rx for hydrocodone apap # 180 dated 12/26/17. Please advise. Harlin Heys FNP out of town until 04/08/18.Please advise.

## 2018-04-18 DIAGNOSIS — M71372 Other bursal cyst, left ankle and foot: Secondary | ICD-10-CM | POA: Diagnosis not present

## 2018-04-24 ENCOUNTER — Other Ambulatory Visit: Payer: Self-pay | Admitting: Family Medicine

## 2018-04-24 DIAGNOSIS — M7989 Other specified soft tissue disorders: Secondary | ICD-10-CM

## 2018-04-24 DIAGNOSIS — E7800 Pure hypercholesterolemia, unspecified: Secondary | ICD-10-CM

## 2018-04-25 ENCOUNTER — Other Ambulatory Visit (INDEPENDENT_AMBULATORY_CARE_PROVIDER_SITE_OTHER): Payer: Medicare HMO

## 2018-04-25 DIAGNOSIS — M7989 Other specified soft tissue disorders: Secondary | ICD-10-CM | POA: Diagnosis not present

## 2018-04-25 DIAGNOSIS — E7800 Pure hypercholesterolemia, unspecified: Secondary | ICD-10-CM | POA: Diagnosis not present

## 2018-04-25 LAB — LIPID PANEL
CHOLESTEROL: 205 mg/dL — AB (ref 0–200)
HDL: 61 mg/dL (ref >39.00)
LDL CALC: 127 mg/dL — AB (ref 0–99)
NonHDL: 144.37
Total CHOL/HDL Ratio: 3
Triglycerides: 86 mg/dL (ref 0.0–149.0)
VLDL: 17.2 mg/dL (ref 0.0–40.0)

## 2018-04-25 LAB — BASIC METABOLIC PANEL
BUN: 11 mg/dL (ref 6–23)
CHLORIDE: 101 meq/L (ref 96–112)
CO2: 31 meq/L (ref 19–32)
Calcium: 9.8 mg/dL (ref 8.4–10.5)
Creatinine, Ser: 0.65 mg/dL (ref 0.40–1.20)
GFR: 99.28 mL/min (ref >60.00)
GLUCOSE: 99 mg/dL (ref 70–99)
POTASSIUM: 3.7 meq/L (ref 3.5–5.1)
SODIUM: 139 meq/L (ref 135–145)

## 2018-04-29 ENCOUNTER — Other Ambulatory Visit: Payer: Medicare HMO

## 2018-05-01 ENCOUNTER — Encounter: Payer: Self-pay | Admitting: Family Medicine

## 2018-05-01 ENCOUNTER — Ambulatory Visit (INDEPENDENT_AMBULATORY_CARE_PROVIDER_SITE_OTHER): Payer: Medicare HMO | Admitting: Family Medicine

## 2018-05-01 VITALS — BP 112/78 | HR 65 | Temp 97.9°F | Ht 64.0 in | Wt 186.0 lb

## 2018-05-01 DIAGNOSIS — G9050 Complex regional pain syndrome I, unspecified: Secondary | ICD-10-CM

## 2018-05-01 DIAGNOSIS — Z0000 Encounter for general adult medical examination without abnormal findings: Secondary | ICD-10-CM | POA: Diagnosis not present

## 2018-05-01 DIAGNOSIS — E669 Obesity, unspecified: Secondary | ICD-10-CM

## 2018-05-01 DIAGNOSIS — M7989 Other specified soft tissue disorders: Secondary | ICD-10-CM

## 2018-05-01 DIAGNOSIS — G8929 Other chronic pain: Secondary | ICD-10-CM | POA: Diagnosis not present

## 2018-05-01 MED ORDER — HYDROCODONE-ACETAMINOPHEN 5-325 MG PO TABS: ORAL_TABLET | ORAL | 0 refills | Status: DC

## 2018-05-01 MED ORDER — HYDROCODONE-ACETAMINOPHEN 5-325 MG PO TABS
1.0000 | ORAL_TABLET | Freq: Four times a day (QID) | ORAL | 0 refills | Status: DC | PRN
Start: 2018-05-01 — End: 2018-08-07

## 2018-05-01 MED ORDER — HYDROCODONE-ACETAMINOPHEN 5-325 MG PO TABS: 1.0000 | ORAL_TABLET | Freq: Four times a day (QID) | ORAL | 0 refills | Status: DC | PRN

## 2018-05-01 MED ORDER — TIZANIDINE HCL 2 MG PO TABS: 2.0000 mg | ORAL_TABLET | Freq: Two times a day (BID) | ORAL | 3 refills | Status: DC

## 2018-05-01 MED ORDER — TRAMADOL HCL 50 MG PO TABS: ORAL_TABLET | ORAL | 0 refills | Status: DC

## 2018-05-01 MED ORDER — HYDROCHLOROTHIAZIDE 25 MG PO TABS: 25.0000 mg | ORAL_TABLET | Freq: Every day | ORAL | 3 refills | Status: DC

## 2018-05-01 NOTE — Progress Notes (Signed)
Subjective:    Patient ID: Jessica Shelton, female    DOB: 26-Jan-1961, 57 y.o.   MRN: [id number]  HPI This is a 57 yo female who presents today for CPE.   Chronic pain- Had a surgery two months ago, had bursa removed. Got cast off of right arm. Continued use of hydrocodone-acetaminophen 5-325, 1-2 tabs every 6 hours and tramadol 50 mg 1-2 tabs every 6 hours and tizanidine 2 mg.   She and her husband have full custody of their 3 school aged grandchildren. The youngest grandson is in remission from leukemia. Has learning difficulties, getting help at school.   Last CPE- 04/25/17 Mammo- 04/17/17 Pap- 04/25/17 Colonoscopy- over due, she agrees to cologuard Tdap- 06/16/08 Flu- declined  Eye- regular Dental- regular Exercise- some walking, housework, limited by pain and joint deformity.  Diet- drinks sweet tea and Pepsi. Increasing proteins   Past Medical History:  Diagnosis Date  . GERD (gastroesophageal reflux disease)   . Pain syndrome, chronic    complex region   Past Surgical History:  Procedure Laterality Date  . CARPAL TUNNEL RELEASE  01/2006  . [facility]  . HAND SURGERY     left  . ROTATOR CUFF REPAIR  08/2000  . thumb surgery     pollicized left   No family history on file. Social History   Tobacco Use  . Smoking status: Never Smoker  . Smokeless tobacco: Never Used  Substance Use Topics  . Alcohol use: No    Alcohol/week: 0.0 standard drinks  . Drug use: No      Review of Systems  Constitutional: Negative.   HENT: Negative.   Eyes: Negative.   Respiratory: Negative.   Cardiovascular: Negative.   Gastrointestinal: Negative.  Negative for constipation (denies).  Endocrine: Negative.   Genitourinary: Negative.   Musculoskeletal:       Chronic multi joint pain/swelling, deformity  Skin: Negative.   Allergic/Immunologic: Negative.   Neurological: Negative.   Hematological: Negative.   Psychiatric/Behavioral:  Negative.        Objective:   Physical Exam Physical Exam  Constitutional: She is oriented to person, place, and time. She appears well-developed and well-nourished. No distress.  HENT:  Head: Normocephalic and atraumatic.  Right Ear: External ear normal.  Left Ear: External ear normal.  Nose: Nose normal.  Mouth/Throat: Oropharynx is clear and moist. No oropharyngeal exudate.  Eyes: Conjunctivae are normal. Pupils are equal, round, and reactive to light.  Neck: Normal range of motion. Neck supple. No JVD present. No thyromegaly present.  Cardiovascular: Normal rate, regular rhythm, normal heart sounds and intact distal pulses.   Pulmonary/Chest: Effort normal and breath sounds normal. Right breast exhibits no inverted nipple, no mass, no nipple discharge, no skin change and no tenderness. Left breast exhibits no inverted nipple, no mass, no nipple discharge, no skin change and no tenderness. Breasts are symmetrical.  Abdominal: Soft. Bowel sounds are normal. She exhibits no distension and no mass. There is no tenderness. There is no rebound and no guarding.  Musculoskeletal: Swelling of hands, feet, multiple joint deformities Lymphadenopathy:    She has no cervical adenopathy.  Neurological: She is alert and oriented to person, place, and time. She has normal reflexes.  Skin: Skin is warm and dry. She is not diaphoretic.  Psychiatric: She has a normal mood and affect. Her behavior is normal. Judgment and thought content normal.  Vitals reviewed.     BP 112/78 (BP  Location: Left Arm, Patient Position: Sitting, Cuff Size: Large)   Pulse 65   Temp 97.9 F (36.6 C) (Oral)   Ht 5\' 4"  (1.626 m)   Wt 186 lb (84.4 kg)   SpO2 96%   BMI 31.93 kg/m  Wt Readings from Last 3 Encounters:  05/01/18 186 lb (84.4 kg)  12/26/17 183 lb 12 oz (83.3 kg)  11/05/17 180 lb 8 oz (81.9 kg)   Depression screen [facility] 2/9 05/01/2018 04/25/2017 03/16/2016 03/16/2015  Decreased Interest 0 0 0 0  Down,  Depressed, Hopeless 0 0 0 0  PHQ - 2 Score 0 0 0 0       Assessment & Plan:  1. Annual physical exam - Discussed and encouraged healthy lifestyle choices- adequate sleep, regular exercise, stress management and healthy food choices.  - Reviewed labs, she will have cologuard screening  2. PAIN, CHRONIC NEC Indication for chronic opioid: Reflex sympathetic dystrophy, polydactyly hands Medication and dose: see below # pills per month: see below Last UDS date: 12/26/2017 Opioid Treatment Agreement signed (Y/N): Y Opioid Treatment Agreement last reviewed with patient:  12/31/2017 NCCSRS reviewed this encounter (include red flags):  yes, no red flags  - HYDROcodone-acetaminophen (NORCO/VICODIN) 5-325 MG tablet; TAKE 1 TO 2 TABLETS BY MOUTH EVERY SIX HOURS AS NEEDED FOR PAIN  Dispense: 180 tablet; Refill: 0 - HYDROcodone-acetaminophen (NORCO/VICODIN) 5-325 MG tablet; Take 1-2 tablets by mouth every 6 (six) hours as needed for moderate pain. May fill 60 days after date on prescription  Dispense: 180 tablet; Refill: 0 - HYDROcodone-acetaminophen (NORCO/VICODIN) 5-325 MG tablet; Take 1-2 tablets by mouth every 6 (six) hours as needed for moderate pain. May fill 30 days after date on prescription  Dispense: 180 tablet; Refill: 0 - traMADol (ULTRAM) 50 MG tablet; TAKE 1 OR 2 TABLETS  BY MOUTH EVERY 6 HOURS AS NEEDED  Dispense: 240 tablet; Refill: 0 - tiZANidine (ZANAFLEX) 2 MG tablet; Take 1 tablet (2 mg total) by mouth 2 (two) times daily.  Dispense: 180 tablet; Refill: 3  3. Reflex sympathetic dystrophy - HYDROcodone-acetaminophen (NORCO/VICODIN) 5-325 MG tablet; TAKE 1 TO 2 TABLETS BY MOUTH EVERY SIX HOURS AS NEEDED FOR PAIN  Dispense: 180 tablet; Refill: 0 - HYDROcodone-acetaminophen (NORCO/VICODIN) 5-325 MG tablet; Take 1-2 tablets by mouth every 6 (six) hours as needed for moderate pain. May fill 60 days after date on prescription  Dispense: 180 tablet; Refill: 0 - HYDROcodone-acetaminophen  (NORCO/VICODIN) 5-325 MG tablet; Take 1-2 tablets by mouth every 6 (six) hours as needed for moderate pain. May fill 30 days after date on prescription  Dispense: 180 tablet; Refill: 0 - traMADol (ULTRAM) 50 MG tablet; TAKE 1 OR 2 TABLETS  BY MOUTH EVERY 6 HOURS AS NEEDED  Dispense: 240 tablet; Refill: 0 - tiZANidine (ZANAFLEX) 2 MG tablet; Take 1 tablet (2 mg total) by mouth 2 (two) times daily.  Dispense: 180 tablet; Refill: 3  4. Leg swelling - hydrochlorothiazide (HYDRODIURIL) 25 MG tablet; Take 1-2 tablets (25-50 mg total) by mouth daily.  Dispense: 180 tablet; Refill: 3  5. Obesity (BMI 30.0-34.9) - encouraged her to eliminate soda and sweet tea, increase lean proteins and vegetables, activity as tolerated   Olean Ree, [name]  [facility], [facility]  05/02/2018 3:23 PM

## 2018-05-01 NOTE — Patient Instructions (Signed)
Good to see you today  Follow up in 3 months  Keeping You Healthy  Get These Tests  Blood Pressure- Have your blood pressure checked by your healthcare provider at least once a year.  Normal blood pressure is 120/80.  Weight- Have your body mass index (BMI) calculated to screen for obesity.  BMI is a measure of body fat based on height and weight.  You can calculate your own BMI at https://www.west-esparza.com/www.[email or web address]/bmi/  Cholesterol- Have your cholesterol checked every year.  Diabetes- Have your blood sugar checked every year if you have high blood pressure, high cholesterol, a family history of diabetes or if you are overweight.  Pap Test - Have a pap test every 1 to 5 years if you have been sexually active.  If you are older than 65 and recent pap tests have been normal you may not need additional pap tests.  In addition, if you have had a hysterectomy  for benign disease additional pap tests are not necessary.  Mammogram-Yearly mammograms are essential for early detection of breast cancer  Screening for Colon Cancer- Colonoscopy starting at age 58. Screening may begin sooner depending on your family history and other health conditions.  Follow up colonoscopy as directed by your Gastroenterologist.  Screening for Osteoporosis- Screening begins at age 58 with bone density scanning, sooner if you are at higher risk for developing Osteoporosis.  Get these medicines  Calcium with Vitamin D- Your body requires 1200-1500 mg of Calcium a day and [phone] IU of Vitamin D a day.  You can only absorb 500 mg of Calcium at a time therefore Calcium must be taken in 2 or 3 separate doses throughout the day.  Hormones- Hormone therapy has been associated with increased risk for certain cancers and heart disease.  Talk to your healthcare provider about if you need relief from menopausal symptoms.  Aspirin- Ask your healthcare provider about taking Aspirin to prevent Heart Disease and Stroke.  Get these  Immuniztions  Flu shot- Every fall  Pneumonia shot- Once after the age of 58; if you are younger ask your healthcare provider if you need a pneumonia shot.  Tetanus- Every ten years.  Zostavax- Once after the age of 58 to prevent shingles.  Take these steps  Don't smoke- Your healthcare provider can help you quit. For tips on how to quit, ask your healthcare provider or go to www.smokefree.gov or call 1-800 QUIT-NOW.  Be physically active- Exercise 5 days a week for a minimum of 30 minutes.  If you are not already physically active, start slow and gradually work up to 30 minutes of moderate physical activity.  Try walking, dancing, bike riding, swimming, etc.  Eat a healthy diet- Eat a variety of healthy foods such as fruits, vegetables, whole grains, low fat milk, low fat cheeses, yogurt, lean meats, chicken, fish, eggs, dried beans, tofu, etc.  For more information go to www.thenutritionsource.org  Dental visit- Brush and floss teeth twice daily; visit your dentist twice a year.  Eye exam- Visit your Optometrist or Ophthalmologist yearly.  Drink alcohol in moderation- Limit alcohol intake to one drink or less a day.  Never drink and drive.  Depression- Your emotional health is as important as your physical health.  If you're feeling down or losing interest in things you normally enjoy, please talk to your healthcare provider.  Seat Belts- can save your life; always wear one  Smoke/Carbon Monoxide detectors- These detectors need to be installed on the appropriate level of your  home.  Replace batteries at least once a year.  Violence- If anyone is threatening or hurting you, please tell your healthcare provider.  Living Will/ Health care power of attorney- Discuss with your healthcare provider and family.

## 2018-05-02 ENCOUNTER — Encounter: Payer: Self-pay | Admitting: Family Medicine

## 2018-05-02 DIAGNOSIS — M65941 Unspecified synovitis and tenosynovitis, right hand: Secondary | ICD-10-CM | POA: Insufficient documentation

## 2018-05-02 DIAGNOSIS — G5631 Lesion of radial nerve, right upper limb: Secondary | ICD-10-CM | POA: Insufficient documentation

## 2018-05-02 DIAGNOSIS — M659 Synovitis and tenosynovitis, unspecified: Secondary | ICD-10-CM | POA: Insufficient documentation

## 2018-05-06 DIAGNOSIS — G90511 Complex regional pain syndrome I of right upper limb: Secondary | ICD-10-CM | POA: Diagnosis not present

## 2018-05-06 DIAGNOSIS — Q688 Other specified congenital musculoskeletal deformities: Secondary | ICD-10-CM | POA: Diagnosis not present

## 2018-05-06 DIAGNOSIS — G5631 Lesion of radial nerve, right upper limb: Secondary | ICD-10-CM | POA: Diagnosis not present

## 2018-05-06 DIAGNOSIS — M1812 Unilateral primary osteoarthritis of first carpometacarpal joint, left hand: Secondary | ICD-10-CM | POA: Diagnosis not present

## 2018-05-06 DIAGNOSIS — M24421 Recurrent dislocation, right elbow: Secondary | ICD-10-CM | POA: Diagnosis not present

## 2018-05-06 DIAGNOSIS — T849XXA Unspecified complication of internal orthopedic prosthetic device, implant and graft, initial encounter: Secondary | ICD-10-CM | POA: Diagnosis not present

## 2018-05-22 ENCOUNTER — Telehealth: Payer: Self-pay | Admitting: Family Medicine

## 2018-05-22 NOTE — Telephone Encounter (Signed)
Form completed, Patient notified, placed upfront for pick. Nothing further needed at this time.

## 2018-05-22 NOTE — Telephone Encounter (Signed)
Pt dropped off a new parking placard form to be filled out. [facility] due to option 1 being circled and then crossed out. Placed forms in RX tower.

## 2018-06-20 ENCOUNTER — Encounter: Payer: Self-pay | Admitting: Family Medicine

## 2018-06-20 ENCOUNTER — Ambulatory Visit (INDEPENDENT_AMBULATORY_CARE_PROVIDER_SITE_OTHER): Payer: Medicare HMO | Admitting: Family Medicine

## 2018-06-20 VITALS — BP 104/66 | HR 61 | Temp 97.8°F | Ht 64.0 in | Wt 191.5 lb

## 2018-06-20 DIAGNOSIS — J4520 Mild intermittent asthma, uncomplicated: Secondary | ICD-10-CM

## 2018-06-20 DIAGNOSIS — J40 Bronchitis, not specified as acute or chronic: Secondary | ICD-10-CM

## 2018-06-20 MED ORDER — ALBUTEROL SULFATE HFA 108 (90 BASE) MCG/ACT IN AERS: 2.0000 | INHALATION_SPRAY | Freq: Four times a day (QID) | RESPIRATORY_TRACT | 2 refills | Status: DC | PRN

## 2018-06-20 MED ORDER — BENZONATATE 100 MG PO CAPS: 100.0000 mg | ORAL_CAPSULE | Freq: Two times a day (BID) | ORAL | 0 refills | Status: DC | PRN

## 2018-06-20 MED ORDER — AZITHROMYCIN 250 MG PO TABS: ORAL_TABLET | ORAL | 0 refills | Status: DC

## 2018-06-20 NOTE — Patient Instructions (Signed)

## 2018-06-20 NOTE — Progress Notes (Signed)
Subjective:     Jessica Shelton is a 58 y.o. female presenting for Cough (x 1 week. Since 06/10/18. Some runny nose, irritated throat sensation fro mthe cough, dry cough. No fever. Did use her husbands inhaler-Albuterol, due to her not having an active RX of her own inhaler. )     Cough  This is a new problem. The current episode started 1 to 4 weeks ago. The problem has been unchanged. The problem occurs hourly. The cough is non-productive. Associated symptoms include nasal congestion, a sore throat and wheezing. Pertinent negatives include no ear congestion, ear pain, fever, headaches, hemoptysis, myalgias, postnasal drip, rhinorrhea or shortness of breath. The symptoms are aggravated by lying down. She has tried a beta-agonist inhaler and OTC cough suppressant for the symptoms. The treatment provided mild relief.   Initially thought allergic reaction but continuing  Having surgery in 2 weeks  Review of Systems  Constitutional: Negative for fever.  HENT: Positive for sore throat. Negative for ear pain, postnasal drip and rhinorrhea.   Respiratory: Positive for cough and wheezing. Negative for hemoptysis and shortness of breath.   Musculoskeletal: Negative for myalgias.  Neurological: Negative for headaches.     Social History   Tobacco Use  Smoking Status Never Smoker  Smokeless Tobacco Never Used        Objective:    BP Readings from Last 3 Encounters:  06/20/18 104/66  05/01/18 112/78  12/26/17 114/70   Wt Readings from Last 3 Encounters:  06/20/18 191 lb 8 oz (86.9 kg)  05/01/18 186 lb (84.4 kg)  12/26/17 183 lb 12 oz (83.3 kg)    BP 104/66   Pulse 61   Temp 97.8 F (36.6 C)   Ht 5\' 4"  (1.626 m)   Wt 191 lb 8 oz (86.9 kg)   SpO2 99%   BMI 32.87 kg/m    Physical Exam Constitutional:      General: She is not in acute distress.    Appearance: She is well-developed. She is not diaphoretic.  HENT:     Head: Normocephalic and atraumatic.     Right Ear:  Tympanic membrane and ear canal normal.     Left Ear: Tympanic membrane and ear canal normal.     Nose: Mucosal edema and rhinorrhea present.     Right Sinus: No maxillary sinus tenderness or frontal sinus tenderness.     Left Sinus: No maxillary sinus tenderness or frontal sinus tenderness.     Mouth/Throat:     Pharynx: Uvula midline. Posterior oropharyngeal erythema present. No oropharyngeal exudate.     Tonsils: Swelling: 0 on the right. 0 on the left.  Eyes:     General: No scleral icterus.    Conjunctiva/sclera: Conjunctivae normal.  Neck:     Musculoskeletal: Neck supple.  Cardiovascular:     Rate and Rhythm: Normal rate and regular rhythm.     Heart sounds: Normal heart sounds. No murmur.  Pulmonary:     Effort: Pulmonary effort is normal. No respiratory distress.     Breath sounds: Decreased air movement present. Decreased breath sounds and wheezing present.  Lymphadenopathy:     Cervical: No cervical adenopathy.  Skin:    General: Skin is warm and dry.     Capillary Refill: Capillary refill takes less than 2 seconds.  Neurological:     Mental Status: She is alert.           Assessment & Plan:   Problem List Items Addressed This  Visit    None    Visit Diagnoses    Bronchitis    -  Primary   Relevant Medications   azithromycin (ZITHROMAX) 250 MG tablet   benzonatate (TESSALON) 100 MG capsule   Mild intermittent asthma, unspecified whether complicated       Relevant Medications   albuterol (PROVENTIL HFA;VENTOLIN HFA) 108 (90 Base) MCG/ACT inhaler   azithromycin (ZITHROMAX) 250 MG tablet   benzonatate (TESSALON) 100 MG capsule     Suspect viral etiology and recommend 1-2 days of symptomatic care with albuterol but given upcoming surgery and unable to tolerate prednisone provided Abx if not improving.   Return if symptoms worsen or fail to improve.  Lynnda ChildJessica R Naela Nodal, MD

## 2018-07-03 ENCOUNTER — Other Ambulatory Visit: Payer: Self-pay | Admitting: Family Medicine

## 2018-07-03 DIAGNOSIS — G8929 Other chronic pain: Secondary | ICD-10-CM

## 2018-07-03 DIAGNOSIS — G9050 Complex regional pain syndrome I, unspecified: Secondary | ICD-10-CM

## 2018-07-03 NOTE — Telephone Encounter (Addendum)
Name of Medication: Tramadol Name of Pharmacy: [facility] or Written Date and Quantity:05/01/18 #240  Last Office Visit and Type: 06/20/18 acute Next Office Visit and Type: 07/31/18 follow-up Last Controlled Substance Agreement Date: 11/07/16 Last UDS:11/07/16

## 2018-07-04 DIAGNOSIS — G5631 Lesion of radial nerve, right upper limb: Secondary | ICD-10-CM | POA: Diagnosis not present

## 2018-07-04 DIAGNOSIS — F1190 Opioid use, unspecified, uncomplicated: Secondary | ICD-10-CM | POA: Diagnosis not present

## 2018-07-04 DIAGNOSIS — I10 Essential (primary) hypertension: Secondary | ICD-10-CM | POA: Diagnosis not present

## 2018-07-04 DIAGNOSIS — G90513 Complex regional pain syndrome I of upper limb, bilateral: Secondary | ICD-10-CM | POA: Diagnosis not present

## 2018-07-04 DIAGNOSIS — Z981 Arthrodesis status: Secondary | ICD-10-CM | POA: Diagnosis not present

## 2018-07-04 DIAGNOSIS — G8918 Other acute postprocedural pain: Secondary | ICD-10-CM | POA: Diagnosis not present

## 2018-07-04 DIAGNOSIS — Z9889 Other specified postprocedural states: Secondary | ICD-10-CM | POA: Diagnosis not present

## 2018-07-04 DIAGNOSIS — M7918 Myalgia, other site: Secondary | ICD-10-CM | POA: Diagnosis not present

## 2018-07-04 DIAGNOSIS — M79601 Pain in right arm: Secondary | ICD-10-CM | POA: Diagnosis not present

## 2018-07-04 DIAGNOSIS — G8929 Other chronic pain: Secondary | ICD-10-CM | POA: Diagnosis not present

## 2018-07-16 DIAGNOSIS — Z4789 Encounter for other orthopedic aftercare: Secondary | ICD-10-CM | POA: Diagnosis not present

## 2018-07-16 DIAGNOSIS — G5631 Lesion of radial nerve, right upper limb: Secondary | ICD-10-CM | POA: Diagnosis not present

## 2018-07-29 DIAGNOSIS — S63621D Sprain of interphalangeal joint of right thumb, subsequent encounter: Secondary | ICD-10-CM | POA: Diagnosis not present

## 2018-07-29 DIAGNOSIS — M79641 Pain in right hand: Secondary | ICD-10-CM | POA: Diagnosis not present

## 2018-07-29 DIAGNOSIS — S63621A Sprain of interphalangeal joint of right thumb, initial encounter: Secondary | ICD-10-CM | POA: Diagnosis not present

## 2018-07-31 ENCOUNTER — Ambulatory Visit: Payer: Medicare HMO | Admitting: Family Medicine

## 2018-08-07 ENCOUNTER — Encounter: Payer: Self-pay | Admitting: Family Medicine

## 2018-08-07 ENCOUNTER — Ambulatory Visit (INDEPENDENT_AMBULATORY_CARE_PROVIDER_SITE_OTHER): Payer: Medicare HMO | Admitting: Family Medicine

## 2018-08-07 DIAGNOSIS — G8929 Other chronic pain: Secondary | ICD-10-CM

## 2018-08-07 DIAGNOSIS — G9050 Complex regional pain syndrome I, unspecified: Secondary | ICD-10-CM

## 2018-08-07 MED ORDER — TRAMADOL HCL 50 MG PO TABS: 50.0000 mg | ORAL_TABLET | Freq: Four times a day (QID) | ORAL | 0 refills | Status: DC | PRN

## 2018-08-07 MED ORDER — HYDROCODONE-ACETAMINOPHEN 5-325 MG PO TABS: 1.0000 | ORAL_TABLET | Freq: Four times a day (QID) | ORAL | 0 refills | Status: DC | PRN

## 2018-08-07 MED ORDER — HYDROCODONE-ACETAMINOPHEN 5-325 MG PO TABS: ORAL_TABLET | ORAL | 0 refills | Status: DC

## 2018-08-07 MED ORDER — TIZANIDINE HCL 2 MG PO TABS: 2.0000 mg | ORAL_TABLET | Freq: Two times a day (BID) | ORAL | 3 refills | Status: DC

## 2018-08-07 NOTE — Patient Instructions (Signed)
Good to see you today  Please collect your stool sample for your Cologuard test and send in  Follow up in 3 months, sooner if needed  Try increasing your protein at lunch to at least 20 grams, continue to drink lots of water, don't forget that stress and sleep play an important role in ability to lose weight. Increase activity as able.

## 2018-08-07 NOTE — Progress Notes (Signed)
Subjective:    Patient ID: Jessica Shelton, female    DOB: [birth year], 58 y.o.   MRN: [id number]  HPI This is a 58 yo female who presents today for follow up of chronic pain for opioid pain medication management. She had an operation on her left forearm last month at [facility] to reroute a nerve and clean up some scar tissue. Had a continuous regional block, did not require any additional pain medication. She denies sedation, dysphoric mood, constipation, abdominal pain with current medication regimen. Feels that pain is bearable.   Obesity- weight creeping up, she has been trying to walk 1- 1.5 miles several days a week when the weather cooperates. Doesn't eat breakfast, eats soup for lunch, cooks dinner nightly. Does not feel that her intake has increased, no change in appetite. Chronic poor sleep. Chronic underlying stress with 3 grandchildren living with her and her husband and problems with the children's mother.   Colon cancer screening- has Cologuard kit at home, intends to collect and send in.    Past Medical History:  Diagnosis Date  . GERD (gastroesophageal reflux disease)   . Pain syndrome, chronic    complex region   Past Surgical History:  Procedure Laterality Date  . CARPAL TUNNEL RELEASE  01/2006  . FOOT SURGERY Left 06/18/2015   [facility]  . HAND SURGERY     left  . ROTATOR CUFF REPAIR  08/2000  . thumb surgery     pollicized left   No family history on file. Social History   Tobacco Use  . Smoking status: Never Smoker  . Smokeless tobacco: Never Used  Substance Use Topics  . Alcohol use: No    Alcohol/week: 0.0 standard drinks  . Drug use: No      Review of Systems Per HPI    Objective:   Physical Exam Vitals signs reviewed.  Constitutional:      General: She is not in acute distress.    Appearance: Normal appearance. She is obese. She is not ill-appearing, toxic-appearing or diaphoretic.  HENT:     Head: Normocephalic and atraumatic.    Eyes:     Conjunctiva/sclera: Conjunctivae normal.  Cardiovascular:     Rate and Rhythm: Normal rate.     Pulses: Normal pulses.  Neurological:     Mental Status: She is alert and oriented to person, place, and time.  Psychiatric:        Mood and Affect: Mood normal.        Behavior: Behavior normal.        Thought Content: Thought content normal.        Judgment: Judgment normal.       BP 116/68 (BP Location: Left Arm, Patient Position: Sitting, Cuff Size: Normal)   Pulse 66   Temp 97.8 F (36.6 C) (Oral)   Ht 5' 4"  (1.626 m)   Wt 193 lb (87.5 kg)   SpO2 96%   BMI 33.13 kg/m      Wt Readings from Last 3 Encounters:  08/07/18 193 lb (87.5 kg)  06/20/18 191 lb 8 oz (86.9 kg)  05/01/18 186 lb (84.4 kg)    Assessment & Plan:  1. PAIN, CHRONIC NEC - Indication for chronic opioid: reflex sympathetic dystrophy, myofascial pain Medication and dose: hydrocodone- acetaminophen 5-325 1-2 q6 prn, tramadol 50-100 mg q 6 prn # pills per month: 180 hydrocodone- acetaminophen Last UDS date: 12/26/17 Opioid Treatment Agreement signed (Y/N): Y Opioid Treatment Agreement last reviewed with patient:  7/19 [facility] reviewed this encounter (include red flags):  reviewed, no red flags - traMADol (ULTRAM) 50 MG tablet; Take 1-2 tablets (50-100 mg total) by mouth every 6 (six) hours as needed.  Dispense: 240 tablet; Refill: 0 - tiZANidine (ZANAFLEX) 2 MG tablet; Take 1 tablet (2 mg total) by mouth 2 (two) times daily.  Dispense: 180 tablet; Refill: 3 - HYDROcodone-acetaminophen (NORCO/VICODIN) 5-325 MG tablet; TAKE 1 TO 2 TABLETS BY MOUTH EVERY SIX HOURS AS NEEDED FOR PAIN  Dispense: 180 tablet; Refill: 0 - HYDROcodone-acetaminophen (NORCO/VICODIN) 5-325 MG tablet; Take 1-2 tablets by mouth every 6 (six) hours as needed for moderate pain. May fill 60 days after date on prescription  Dispense: 180 tablet; Refill: 0 - HYDROcodone-acetaminophen (NORCO/VICODIN) 5-325 MG tablet; Take 1-2 tablets by  mouth every 6 (six) hours as needed for moderate pain. May fill 30 days after date on prescription  Dispense: 180 tablet; Refill: 0  2. Reflex sympathetic dystrophy - traMADol (ULTRAM) 50 MG tablet; Take 1-2 tablets (50-100 mg total) by mouth every 6 (six) hours as needed.  Dispense: 240 tablet; Refill: 0 - tiZANidine (ZANAFLEX) 2 MG tablet; Take 1 tablet (2 mg total) by mouth 2 (two) times daily.  Dispense: 180 tablet; Refill: 3 - HYDROcodone-acetaminophen (NORCO/VICODIN) 5-325 MG tablet; TAKE 1 TO 2 TABLETS BY MOUTH EVERY SIX HOURS AS NEEDED FOR PAIN  Dispense: 180 tablet; Refill: 0 - HYDROcodone-acetaminophen (NORCO/VICODIN) 5-325 MG tablet; Take 1-2 tablets by mouth every 6 (six) hours as needed for moderate pain. May fill 60 days after date on prescription  Dispense: 180 tablet; Refill: 0 - HYDROcodone-acetaminophen (NORCO/VICODIN) 5-325 MG tablet; Take 1-2 tablets by mouth every 6 (six) hours as needed for moderate pain. May fill 30 days after date on prescription  Dispense: 180 tablet; Refill: 0  - follow up in 101month  DClarene Reamer [name]  [facility] 08/07/2018 9:04 AM

## 2018-08-09 ENCOUNTER — Other Ambulatory Visit: Payer: Self-pay | Admitting: Family Medicine

## 2018-08-09 DIAGNOSIS — G9050 Complex regional pain syndrome I, unspecified: Secondary | ICD-10-CM

## 2018-08-09 DIAGNOSIS — G8929 Other chronic pain: Secondary | ICD-10-CM

## 2018-08-09 NOTE — Telephone Encounter (Signed)
Landon at [facility] pharmacy left v/m requesting reason for tramadol denial. Landon request cb.

## 2018-08-09 NOTE — Telephone Encounter (Signed)
This was sent to [facility] Order on 08/07/2018 for #240 This was explained to [facility].  They will relay to patient that she will be receiving her medication via mail order.   Nothing further needed.

## 2018-08-14 DIAGNOSIS — G5631 Lesion of radial nerve, right upper limb: Secondary | ICD-10-CM | POA: Diagnosis not present

## 2018-10-11 ENCOUNTER — Other Ambulatory Visit: Payer: Self-pay

## 2018-10-11 ENCOUNTER — Ambulatory Visit (INDEPENDENT_AMBULATORY_CARE_PROVIDER_SITE_OTHER): Payer: Medicare HMO | Admitting: Family Medicine

## 2018-10-11 ENCOUNTER — Encounter: Payer: Self-pay | Admitting: Family Medicine

## 2018-10-11 VITALS — BP 112/79 | HR 59 | Wt 170.0 lb

## 2018-10-11 DIAGNOSIS — K4090 Unilateral inguinal hernia, without obstruction or gangrene, not specified as recurrent: Secondary | ICD-10-CM

## 2018-10-11 DIAGNOSIS — G9050 Complex regional pain syndrome I, unspecified: Secondary | ICD-10-CM

## 2018-10-11 DIAGNOSIS — G8929 Other chronic pain: Secondary | ICD-10-CM | POA: Diagnosis not present

## 2018-10-11 MED ORDER — TRAMADOL HCL 50 MG PO TABS: 50.0000 mg | ORAL_TABLET | Freq: Four times a day (QID) | ORAL | 0 refills | Status: DC | PRN

## 2018-10-11 NOTE — Patient Instructions (Signed)
Hi Jessica Shelton,  Good to talk with you today during your virtual visit.  As we discussed, I have put in a referral for you to see Dr. Lily Peer at [facility].  The referrals coordinator here in the office will make sure your records are sent. Below is some information about hernias.  If you have any acute severe pain that is not relieved with position change, seek care immediately. Please let me know if you have any questions. Warm regards,  Deboraha Sprang, [name]   Hernia, Adult     A hernia happens when tissue inside your body pushes out through a weak spot in your belly muscles (abdominal wall). This makes a round lump (bulge). The lump may be:  In a scar from surgery that was done in your belly (incisional hernia).  Near your belly button (umbilical hernia).  In your groin (inguinal hernia). Your groin is the area where your leg meets your lower belly (abdomen). This kind of hernia could also be: ? In your scrotum, if you are female. ? In folds of skin around your vagina, if you are female.  In your upper thigh (femoral hernia).  Inside your belly (hiatal hernia). This happens when your stomach slides above the muscle between your belly and your chest (diaphragm). If your hernia is small and it does not cause pain, you may not need treatment. If your hernia is large or it causes pain, you may need surgery. Follow these instructions at home: Activity  Avoid stretching or overusing (straining) the muscles near your hernia. Straining can happen when you: ? Lift something heavy. ? Poop (have a bowel movement).  Do not lift anything that is heavier than 10 lb (4.5 kg), or the limit that you are told, until your doctor says that it is safe.  Use the strength of your legs when you lift something heavy. Do not use only your back muscles to lift. General instructions  Do these things if told by your doctor so you do not have trouble pooping (constipation): ? Drink  enough fluid to keep your pee (urine) pale yellow. ? Eat foods that are high in fiber. These include fresh fruits and vegetables, whole grains, and beans. ? Limit foods that are high in fat and processed sugars. These include foods that are fried or sweet. ? Take medicine for trouble pooping.  When you cough, try to cough gently.  You may try to push your hernia in by very gently pressing on it when you are lying down. Do not try to force the bulge back in if it will not push in easily.  If you are overweight, work with your doctor to lose weight safely.  Do not use any products that have nicotine or tobacco in them. These include cigarettes and e-cigarettes. If you need help quitting, ask your doctor.  If you will be having surgery (hernia repair), watch your hernia for changes in shape, size, or color. Tell your doctor if you see any changes.  Take over-the-counter and prescription medicines only as told by your doctor.  Keep all follow-up visits as told by your doctor. Contact a doctor if:  You get new pain, swelling, or redness near your hernia.  You poop fewer times in a week than normal.  You have trouble pooping.  You have poop (stool) that is more dry than normal.  You have poop that is harder or larger than normal. Get help right away if:  You have a  fever.  You have belly pain that gets worse.  You feel sick to your stomach (nauseous).  You throw up (vomit).  Your hernia cannot be pushed in by very gently pressing on it when you are lying down. Do not try to force the bulge back in if it will not push in easily.  Your hernia: ? Changes in shape or size. ? Changes color. ? Feels hard or it hurts when you touch it. These symptoms may represent a serious problem that is an emergency. Do not wait to see if the symptoms will go away. Get medical help right away. Call your local emergency services (911 in the [place] Summary  A hernia happens when tissue inside your  body pushes out through a weak spot in the belly muscles. This creates a bulge.  If your hernia is small and it does not hurt, you may not need treatment. If your hernia is large or it hurts, you may need surgery.  If you will be having surgery, watch your hernia for changes in shape, size, or color. Tell your doctor about any changes. This information is not intended to replace advice given to you by your health care provider. Make sure you discuss any questions you have with your health care provider. Document Released: 11/23/2009 Document Revised: 03/07/2017 Document Reviewed: 03/07/2017 Elsevier Interactive Patient Education  2019 [employer]

## 2018-10-11 NOTE — Progress Notes (Signed)
Virtual Visit via Video Note  I connected with Jessica Shelton on 10/11/18 at  9:04 AM EDT by a video enabled telemedicine application and verified that I am speaking with the correct person using two identifiers. The patient was in her home and I was in my office.    I discussed the limitations of evaluation and management by telemedicine and the availability of in person appointments. The patient expressed understanding and agreed to proceed.  History of Present Illness: This is a 58 year old female who requests virtual visit today to discuss a referral to general surgery at [facility].  She is having what she thinks is a recurrent left groin hernia.  August 2012 she had left femoral hernia surgery by Dr. Lily Peer.  She has done well until recently when she started to have pulling and a bulge in her left groin.  Generally this is improved with flexing at the hips.  She did have one episode of pain the other night that made her very nauseous.  She has called Dr. Fontaine No office and requires a referral from her PCP.  She has otherwise been doing well.  She has chronic pain management related to lifelong surgeries and regional complex pain syndrome.  She requests refill of tramadol today.    Observations/Objective: The patient is alert and answers questions appropriately.  Visible skin is unremarkable.  She is normally conversive without shortness of breath. BP 112/79   Pulse (!) 59   Wt 170 lb (77.1 kg)   BMI 29.18 kg/m    Assessment and Plan: 1. Left groin hernia -Reviewed warning signs of hernia and instructed her to go to the emergency room if these occur. - Ambulatory referral to General Surgery  2. PAIN, CHRONIC NEC - traMADol (ULTRAM) 50 MG tablet; Take 1-2 tablets (50-100 mg total) by mouth every 6 (six) hours as needed.  Dispense: 240 tablet; Refill: 0  3. Reflex sympathetic dystrophy - traMADol (ULTRAM) 50 MG tablet; Take 1-2 tablets (50-100 mg total)  by mouth every 6 (six) hours as needed.  Dispense: 240 tablet; Refill: 0   Olean Ree, [name]  [facility] [place]  10/11/2018 9:28 AM   Follow Up Instructions: AVS  Mailed to patient.    I discussed the assessment and treatment plan with the patient. The patient was provided an opportunity to ask questions and all were answered. The patient agreed with the plan and demonstrated an understanding of the instructions.   The patient was advised to call back or seek an in-person evaluation if the symptoms worsen or if the condition fails to improve as anticipated.   Emi Belfast, FNP

## 2018-10-17 DIAGNOSIS — K4190 Unilateral femoral hernia, without obstruction or gangrene, not specified as recurrent: Secondary | ICD-10-CM | POA: Diagnosis not present

## 2018-11-06 ENCOUNTER — Ambulatory Visit: Payer: Medicare HMO | Admitting: Family Medicine

## 2018-11-14 ENCOUNTER — Other Ambulatory Visit: Payer: Self-pay | Admitting: Family Medicine

## 2018-11-14 DIAGNOSIS — G9050 Complex regional pain syndrome I, unspecified: Secondary | ICD-10-CM

## 2018-11-14 DIAGNOSIS — G8929 Other chronic pain: Secondary | ICD-10-CM

## 2018-11-14 NOTE — Telephone Encounter (Signed)
Hydrocodone-last filled 08/07/2018 #180 x 3.   Tramadol last refilled on 10/11/2018 for #240 0 refill  LOV 10/11/2018 for referral to general surgeon for hernia. Future appointment on 12/04/2018. UDS 12/26/2017 Pain contract 12/31/2017

## 2018-11-15 DIAGNOSIS — M79605 Pain in left leg: Secondary | ICD-10-CM | POA: Diagnosis not present

## 2018-11-15 DIAGNOSIS — R1032 Left lower quadrant pain: Secondary | ICD-10-CM | POA: Diagnosis not present

## 2018-11-15 DIAGNOSIS — K4190 Unilateral femoral hernia, without obstruction or gangrene, not specified as recurrent: Secondary | ICD-10-CM | POA: Diagnosis not present

## 2018-12-04 ENCOUNTER — Ambulatory Visit (INDEPENDENT_AMBULATORY_CARE_PROVIDER_SITE_OTHER): Payer: Medicare HMO | Admitting: Family Medicine

## 2018-12-04 ENCOUNTER — Encounter: Payer: Self-pay | Admitting: Family Medicine

## 2018-12-04 VITALS — Wt 192.0 lb

## 2018-12-04 DIAGNOSIS — R591 Generalized enlarged lymph nodes: Secondary | ICD-10-CM

## 2018-12-04 DIAGNOSIS — M19042 Primary osteoarthritis, left hand: Secondary | ICD-10-CM | POA: Insufficient documentation

## 2018-12-04 DIAGNOSIS — G8929 Other chronic pain: Secondary | ICD-10-CM | POA: Diagnosis not present

## 2018-12-04 DIAGNOSIS — G9050 Complex regional pain syndrome I, unspecified: Secondary | ICD-10-CM | POA: Diagnosis not present

## 2018-12-04 DIAGNOSIS — R590 Localized enlarged lymph nodes: Secondary | ICD-10-CM

## 2018-12-04 MED ORDER — HYDROCODONE-ACETAMINOPHEN 5-325 MG PO TABS: 1.0000 | ORAL_TABLET | Freq: Four times a day (QID) | ORAL | 0 refills | Status: DC | PRN

## 2018-12-04 MED ORDER — TRAMADOL HCL 50 MG PO TABS: 50.0000 mg | ORAL_TABLET | Freq: Four times a day (QID) | ORAL | 0 refills | Status: DC | PRN

## 2018-12-04 MED ORDER — HYDROCODONE-ACETAMINOPHEN 5-325 MG PO TABS: ORAL_TABLET | ORAL | 0 refills | Status: DC

## 2018-12-04 NOTE — Progress Notes (Signed)
Virtual Visit via Video Note  I connected with Jessica [place] on 12/04/18 at 11:32 AM EDT by a video enabled telemedicine application and verified that I am speaking with the correct person using two identifiers.  Location: Patient: Jessica [place] Provider: [facility] [place]   I discussed the limitations of evaluation and management by telemedicine and the availability of Jessica person appointments. The patient expressed understanding and agreed to proceed.  History of Present Illness: This is a 58 year old female who requests virtual visit today for pain management.  She has been maintained on current pain regimen for many years for her chronic pain and reflex sympathetic dystrophy.  She reports that her pain is tolerable on her current regimen and allows her to do the things she needs to around her [place] and to take care of her 3 grandchildren who live with her.  She denies any abdominal pain, constipation, oversedation. She was recently seen at [facility] for evaluation of what was thought to be of recurrent right inguinal hernia.  An ultrasound was performed which showed reactive lymph nodes.  She is to follow-up Jessica a couple of months for reevaluation.  She denies any systemic symptoms such as fever/chills, weight loss, night sweats.  She has not had any recent blood work.  She does have a cat but denies having any cat scratches.  Past Medical History:  Diagnosis Date  . GERD (gastroesophageal reflux disease)   . Pain syndrome, chronic    complex region   Past Surgical History:  Procedure Laterality Date  . CARPAL TUNNEL RELEASE  01/2006  . FOOT [facility]GERY [facility]18[facility]  . HAND SURGERY     left  . ROTATOR CUFF REPAIR  08/2000  . thumb surgery     pollicized left   No family history on file. Social History   Tobacco Use  . Smoking status: Never Smoker  . Smokeless tobacco: Never Used  Substance Use Topics  . Alcohol use: No    Alcohol/week:  0.0 standard drinks  . Drug use: No      Observations/Objective: Patient is alert and answers questions appropriately.  Visible skin is unremarkable.  She is normally conversive without shortness of breath.  Assessment and Plan: .1. Encounter for chronic pain management -Indication for chronic opioid: RSD Medication and dose: hydrocodone- acetaminophen 5-325 mg, 1-2 tabs q 6 prn # pills per month: 180 Last UDS date: 12/26/2017 Opioid Treatment Agreement signed (Y/N): 12/26/2017 Opioid Treatment Agreement last reviewed with patient:  12/26/2017 NCCSRS reviewed this encounter (include red flags):  yes, no red flags   2. PAIN, CHRONIC NEC - managed on current medication - HYDROcodone-acetaminophen (NORCO/VICODIN) 5-325 MG tablet; TAKE 1 TO 2 TABLETS BY MOUTH EVERY SIX HOURS AS NEEDED FOR PAIN  Dispense: 180 tablet; Refill: 0 - traMADol (ULTRAM) 50 MG tablet; Take 1-2 tablets (50-100 mg total) by mouth every 6 (six) hours as needed.  Dispense: 240 tablet; Refill: 0 - HYDROcodone-acetaminophen (NORCO/VICODIN) 5-325 MG tablet; Take 1-2 tablets by mouth every 6 (six) hours as needed for moderate pain.  Dispense: 180 tablet; Refill: 0 - HYDROcodone-acetaminophen (NORCO/VICODIN) 5-325 MG tablet; Take 1-2 tablets by mouth every 6 (six) hours as needed for moderate pain. May fill 30 days after date on prescription  Dispense: 180 tablet; Refill: 0  3. Reflex sympathetic dystrophy - HYDROcodone-acetaminophen (NORCO/VICODIN) 5-325 MG tablet; TAKE 1 TO 2 TABLETS BY MOUTH EVERY SIX HOURS AS NEEDED FOR PAIN  Dispense: 180  tablet; Refill: 0 - traMADol (ULTRAM) 50 MG tablet; Take 1-2 tablets (50-100 mg total) by mouth every 6 (six) hours as needed.  Dispense: 240 tablet; Refill: 0 - HYDROcodone-acetaminophen (NORCO/VICODIN) 5-325 MG tablet; Take 1-2 tablets by mouth every 6 (six) hours as needed for moderate pain.  Dispense: 180 tablet; Refill: 0 - HYDROcodone-acetaminophen (NORCO/VICODIN) 5-325 MG tablet;  Take 1-2 tablets by mouth every 6 (six) hours as needed for moderate pain. May fill 30 days after date on prescription  Dispense: 180 tablet; Refill: 0  4. Focal lymphadenopathy - CBC with Differential/Platelet; Future   Clarene Reamer, [name]  [place] [facility], [facility]  12/04/2018 5:01 PM   Follow Up Instructions:  Patient does not have MyChart access so AVS was mailed to her [place] address   I discussed the assessment and treatment plan with the patient. The patient was provided an opportunity to ask questions and all were answered. The patient agreed with the plan and demonstrated an understanding of the instructions.   The patient was advised to call back or seek an Jessica-person evaluation if the symptoms worsen or if the condition fails to improve as anticipated.   Elby Beck, FNP

## 2018-12-04 NOTE — Patient Instructions (Signed)
Hi Jessica [place],  It is good to see you today for your virtual visit.  I have put in an order for you to get a blood count when you get time.  You can schedule lab only visit.  Please follow-up in 3 months for chronic neck pain medication management.  You will be due your annual physical exam in about 6 months.  Please let me know if you have any questions.  Warm regards,  Tor Netters, [name]

## 2019-01-24 ENCOUNTER — Other Ambulatory Visit: Payer: Self-pay | Admitting: Family Medicine

## 2019-01-24 DIAGNOSIS — G9050 Complex regional pain syndrome I, unspecified: Secondary | ICD-10-CM

## 2019-01-24 DIAGNOSIS — G8929 Other chronic pain: Secondary | ICD-10-CM

## 2019-01-24 NOTE — Telephone Encounter (Signed)
Upcoming appt 05/07/19 at 9:30 with Tor Netters, FNP LOV 12/04/2018 for pain management visit Last refilled 12/04/2018 #180 x 3 rx's  Please advise.

## 2019-02-14 DIAGNOSIS — R1032 Left lower quadrant pain: Secondary | ICD-10-CM | POA: Diagnosis not present

## 2019-02-14 DIAGNOSIS — Z6832 Body mass index (BMI) 32.0-32.9, adult: Secondary | ICD-10-CM | POA: Diagnosis not present

## 2019-02-14 DIAGNOSIS — Z713 Dietary counseling and surveillance: Secondary | ICD-10-CM | POA: Diagnosis not present

## 2019-02-14 DIAGNOSIS — M25552 Pain in left hip: Secondary | ICD-10-CM | POA: Diagnosis not present

## 2019-02-14 DIAGNOSIS — R635 Abnormal weight gain: Secondary | ICD-10-CM | POA: Diagnosis not present

## 2019-03-04 ENCOUNTER — Telehealth: Payer: Self-pay | Admitting: Family Medicine

## 2019-03-04 DIAGNOSIS — G8929 Other chronic pain: Secondary | ICD-10-CM

## 2019-03-04 DIAGNOSIS — G9050 Complex regional pain syndrome I, unspecified: Secondary | ICD-10-CM

## 2019-03-05 ENCOUNTER — Other Ambulatory Visit: Payer: Self-pay | Admitting: Family Medicine

## 2019-03-05 DIAGNOSIS — M7989 Other specified soft tissue disorders: Secondary | ICD-10-CM

## 2019-03-06 DIAGNOSIS — R1032 Left lower quadrant pain: Secondary | ICD-10-CM | POA: Diagnosis not present

## 2019-03-06 DIAGNOSIS — K429 Umbilical hernia without obstruction or gangrene: Secondary | ICD-10-CM | POA: Insufficient documentation

## 2019-03-06 DIAGNOSIS — M5136 Other intervertebral disc degeneration, lumbar region: Secondary | ICD-10-CM | POA: Diagnosis not present

## 2019-03-06 NOTE — Telephone Encounter (Signed)
Name of Medication: Hydrocodone-Acet Name of Pharmacy: [facility] or [facility] Date and Quantity: 12/04/2018 x 3. Last Office Visit and Type: 12/04/2018-pain management f/u Next Office Visit and Type: 05/07/2019 for CPE Last Controlled Substance Agreement Date:  12/31/2017 Last UDS: 12/26/2017  Name of Medication: Tramadol Name of Pharmacy: [facility] Date and Quantity: 01/24/2019 #240 with 0 refill Last Office Visit and Type: 12/04/2018-pain management f/u Next Office Visit and Type: 05/07/2019 for CPE Last Controlled Substance Agreement Date:  12/31/2017 Last UDS: 12/26/2017

## 2019-03-06 NOTE — Telephone Encounter (Signed)
Patient stated the pharmacy called her letting her know her prescriptions were denied She stated she is going to be out of medication on Saturday, and that at her last visit she thought that Jackelyn Poling stated that she was good on refills until November Please advise

## 2019-03-07 ENCOUNTER — Ambulatory Visit (INDEPENDENT_AMBULATORY_CARE_PROVIDER_SITE_OTHER): Payer: Medicare HMO | Admitting: Family Medicine

## 2019-03-07 ENCOUNTER — Other Ambulatory Visit: Payer: Self-pay

## 2019-03-07 ENCOUNTER — Encounter: Payer: Self-pay | Admitting: Family Medicine

## 2019-03-07 VITALS — Ht 64.0 in | Wt 192.0 lb

## 2019-03-07 DIAGNOSIS — G9050 Complex regional pain syndrome I, unspecified: Secondary | ICD-10-CM

## 2019-03-07 DIAGNOSIS — G8929 Other chronic pain: Secondary | ICD-10-CM | POA: Diagnosis not present

## 2019-03-07 DIAGNOSIS — E7800 Pure hypercholesterolemia, unspecified: Secondary | ICD-10-CM

## 2019-03-07 MED ORDER — HYDROCODONE-ACETAMINOPHEN 5-325 MG PO TABS: 1.0000 | ORAL_TABLET | Freq: Four times a day (QID) | ORAL | 0 refills | Status: DC | PRN

## 2019-03-07 MED ORDER — HYDROCODONE-ACETAMINOPHEN 5-325 MG PO TABS: ORAL_TABLET | ORAL | 0 refills | Status: DC

## 2019-03-07 MED ORDER — TRAMADOL HCL 50 MG PO TABS: ORAL_TABLET | ORAL | 0 refills | Status: DC

## 2019-03-07 NOTE — Telephone Encounter (Signed)
Pt is scheduled for a 4:15 appt today - virtual Jackelyn Poling is going the see the patient around lunch time.   Nothing further needed.

## 2019-03-07 NOTE — Telephone Encounter (Signed)
Please see if she can do a virtual visit today. I have to have a visit with her every 3 months to refill her pain medications.

## 2019-03-07 NOTE — Telephone Encounter (Addendum)
It looks like Jessica [place] advised the patient at her last OV that she needed to follow up in 3 months (02/2019) for UDS and pain management visit. Pt states that she cannot come in the office d/t raising 3 smalls kids and not having anyone to watch them and she also does not want to get exposed to anything, one of the kids is post cancer treatments. Pt states that she is going to be out of her pain meds TOMORROW and needs this refilled asap.   Please advise if there is anything that can be done. Thanks.   Patient Instructions by Jessica Beck, Jessica [place] at 12/04/2018 11:00 AM Hi Jessica [place],  It is good to see you today for your virtual visit.  I have put in an order for you to get a blood count when you get time.  You can schedule lab only visit.  Please follow-up in 3 months for chronic neck pain medication management.  You will be due your annual physical exam in about 6 months.  Please let me know if you have any questions.  Warm regards,  Jessica Netters, Jessica [place]

## 2019-03-07 NOTE — Progress Notes (Signed)
Virtual Visit via Video Note  I connected with Jessica [place] on 03/07/19 at  12:45 PM EDT by a video enabled telemedicine application and verified that I am speaking with the correct person using two identifiers.  Location: Patient: In her home Provider: [place]   I discussed the limitations of evaluation and management by telemedicine and the availability of in person appointments. The patient expressed understanding and agreed to proceed.  History of Present Illness: This is a 58 yo female who requests virtual visit today for chronic opioid management. 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   She has had some weight gain since pandemic but is working to lose the weight.  She is cutting back on soda.  Increasing walking.  Increased stress with 3 grandchildren being home and trying to juggle remote learning especially with youngest grandson who has lasting effects from leukemia treatment including "chemo brain," newly diagnosed ADHD.  Was seen by general surgery at [facility] yesterday for swelling in right groin.  She had an ultrasound and a CT that showed a swollen lymph node but no hernia in that area.  CT did show a small umbilical hernia and a cyst of her kidney.  There can continue to monitor these for the time being.  Chronic pain- patient has been maintained on current medications for many years.  She reports that her pain is manageable but is never completely relieved.  She gets couple of hours of good sleep at night and the rest of the night is up and down, this is no change for her.  She reports that she feels rested.  She does have some left leg jerking if she is had a particularly physical day.  She does take tizanidine for this on the weekends as needed.   Past Medical History:  Diagnosis Date  . GERD (gastroesophageal reflux disease)   . Pain syndrome, chronic     complex region   Past Surgical History:  Procedure Laterality Date  . CARPAL TUNNEL RELEASE  01/2006  . FOOT SURGERY Left 06/18/2015   [facility]  . HAND SURGERY     left  . ROTATOR CUFF REPAIR  08/2000  . thumb surgery     pollicized left   History reviewed. No pertinent family history. Social History   Tobacco Use  . Smoking status: Never Smoker  . Smokeless tobacco: Never Used  Substance Use Topics  . Alcohol use: No    Alcohol/week: 0.0 standard drinks  . Drug use: No      Observations/Objective: The patient is alert and answers questions appropriately.  Visible skin is unremarkable.  Respirations are even and unlabored without shortness of breath, audible wheeze or witnessed cough.  Her mood and affect are typical for her. Ht 5\' 4"  (1.626 m)   Wt 192 lb (87.1 kg)   BMI 32.96 kg/m  Wt Readings from Last 3 Encounters:  03/07/19 192 lb (87.1 kg)  12/04/18 192 lb (87.1 kg)  10/11/18 170 lb (77.1 kg)    Assessment and Plan: 1. Encounter for chronic pain management Indication for chronic opioid: RSD, posterior interosseous nerve syndrome, right.  Medication and dose: hydrocodone-acetaminophen 5-325, 1-2 q6 prn # pills per month: 180 Last UDS date: 7/19- will check at next in person appointment Opioid Treatment Agreement signed (Y/N): 7/19 Opioid Treatment Agreement last reviewed with patient:  Will update at next visit NCCSRS reviewed this encounter (include red flags): Yes- no red flags   2. PAIN, CHRONIC NEC - traMADol (ULTRAM) 50 MG tablet; TAKE 1 TO 2 TABLETS (50-100 MG TOTAL) BY MOUTH EVERY 6 HOURS AS NEEDED.  Dispense: 240 tablet; Refill: 0 - HYDROcodone-acetaminophen (NORCO/VICODIN) 5-325 MG tablet; TAKE 1 TO 2 TABLETS BY MOUTH EVERY SIX HOURS AS NEEDED FOR PAIN  Dispense: 180 tablet; Refill: 0 - HYDROcodone-acetaminophen (NORCO/VICODIN) 5-325 MG tablet; Take 1-2 tablets by mouth every 6 (six) hours as needed for moderate pain.  Dispense: 180  tablet; Refill: 0 - HYDROcodone-acetaminophen (NORCO/VICODIN) 5-325 MG tablet; Take 1-2 tablets by mouth every 6 (six) hours as needed for moderate pain. May fill 30 days after date on prescription  Dispense: 180 tablet; Refill: 0  3. Reflex sympathetic dystrophy - traMADol (ULTRAM) 50 MG tablet; TAKE 1 TO 2 TABLETS (50-100 MG TOTAL) BY MOUTH EVERY 6 HOURS AS NEEDED.  Dispense: 240 tablet; Refill: 0 - HYDROcodone-acetaminophen (NORCO/VICODIN) 5-325 MG tablet; TAKE 1 TO 2 TABLETS BY MOUTH EVERY SIX HOURS AS NEEDED FOR PAIN  Dispense: 180 tablet; Refill: 0 - HYDROcodone-acetaminophen (NORCO/VICODIN) 5-325 MG tablet; Take 1-2 tablets by mouth every 6 (six) hours as needed for moderate pain.  Dispense: 180 tablet; Refill: 0 - HYDROcodone-acetaminophen (NORCO/VICODIN) 5-325 MG tablet; Take 1-2 tablets by mouth every 6 (six) hours as needed for moderate pain. May fill 30 days after date on prescription  Dispense: 180 tablet; Refill: 0  - follow up on file for CPE 11/20  Olean Reeeborah Gessner, [name]  [place] [facility] at [facility] [place]  03/07/2019 1:11 PM   Follow Up Instructions:    I discussed the assessment and treatment plan with the patient. The patient was provided an opportunity to ask questions and all were answered. The patient agreed with the plan and demonstrated an understanding of the instructions.   The patient was advised to call back or seek an in-person evaluation if the symptoms worsen or if the condition fails to improve as anticipated.   Emi Belfasteborah B Gessner, FNP

## 2019-04-11 ENCOUNTER — Other Ambulatory Visit: Payer: Self-pay | Admitting: Family Medicine

## 2019-04-11 DIAGNOSIS — G9050 Complex regional pain syndrome I, unspecified: Secondary | ICD-10-CM

## 2019-04-11 DIAGNOSIS — G8929 Other chronic pain: Secondary | ICD-10-CM

## 2019-04-11 NOTE — Telephone Encounter (Signed)
Last seen 03/07/2019 Upcoming visit 05/07/2019  Please advise Debbie, thanks.

## 2019-04-28 ENCOUNTER — Telehealth: Payer: Self-pay

## 2019-04-28 NOTE — Telephone Encounter (Signed)
LVM to call clinic, pt needs COVID screen, front door and back lab info 11.9.2020 TLJ 

## 2019-04-29 ENCOUNTER — Other Ambulatory Visit: Payer: Self-pay | Admitting: *Deleted

## 2019-04-29 ENCOUNTER — Other Ambulatory Visit (INDEPENDENT_AMBULATORY_CARE_PROVIDER_SITE_OTHER): Payer: Medicare HMO

## 2019-04-29 DIAGNOSIS — G8929 Other chronic pain: Secondary | ICD-10-CM | POA: Diagnosis not present

## 2019-04-29 DIAGNOSIS — G9050 Complex regional pain syndrome I, unspecified: Secondary | ICD-10-CM | POA: Diagnosis not present

## 2019-04-29 DIAGNOSIS — E7800 Pure hypercholesterolemia, unspecified: Secondary | ICD-10-CM

## 2019-04-29 LAB — LIPID PANEL
Cholesterol: 215 mg/dL — ABNORMAL HIGH (ref 0–200)
HDL: 62.1 mg/dL (ref >39.00)
LDL Cholesterol: 136 mg/dL — ABNORMAL HIGH (ref 0–99)
NonHDL: 153.18
Total CHOL/HDL Ratio: 3
Triglycerides: 87 mg/dL (ref 0.0–149.0)
VLDL: 17.4 mg/dL (ref 0.0–40.0)

## 2019-04-29 LAB — COMPREHENSIVE METABOLIC PANEL
ALT: 10 U/L (ref 0–35)
AST: 9 U/L (ref 0–37)
Albumin: 4.5 g/dL (ref 3.5–5.2)
Alkaline Phosphatase: 74 U/L (ref 39–117)
BUN: 11 mg/dL (ref 6–23)
CO2: 28 mEq/L (ref 19–32)
Calcium: 9.9 mg/dL (ref 8.4–10.5)
Chloride: 100 mEq/L (ref 96–112)
Creatinine, Ser: 0.62 mg/dL (ref 0.40–1.20)
GFR: 98.3 mL/min (ref >60.00)
Glucose, Bld: 102 mg/dL — ABNORMAL HIGH (ref 70–99)
Potassium: 3.5 mEq/L (ref 3.5–5.1)
Sodium: 137 mEq/L (ref 135–145)
Total Bilirubin: 0.4 mg/dL (ref 0.2–1.2)
Total Protein: 8 g/dL (ref 6.0–8.3)

## 2019-04-29 NOTE — Addendum Note (Signed)
Addended by: Ellamae Sia on: 04/29/2019 11:03 AM   Modules accepted: Orders

## 2019-04-30 ENCOUNTER — Other Ambulatory Visit: Payer: Medicare HMO

## 2019-05-07 ENCOUNTER — Ambulatory Visit (INDEPENDENT_AMBULATORY_CARE_PROVIDER_SITE_OTHER): Payer: Medicare HMO | Admitting: Family Medicine

## 2019-05-07 ENCOUNTER — Other Ambulatory Visit: Payer: Self-pay

## 2019-05-07 ENCOUNTER — Other Ambulatory Visit: Payer: Medicare HMO

## 2019-05-07 ENCOUNTER — Encounter: Payer: Self-pay | Admitting: Family Medicine

## 2019-05-07 VITALS — BP 98/60 | HR 71 | Temp 98.5°F | Ht 64.25 in | Wt 191.8 lb

## 2019-05-07 DIAGNOSIS — Z0000 Encounter for general adult medical examination without abnormal findings: Secondary | ICD-10-CM

## 2019-05-07 DIAGNOSIS — Z5320 Procedure and treatment not carried out because of patient's decision for unspecified reasons: Secondary | ICD-10-CM | POA: Diagnosis not present

## 2019-05-07 DIAGNOSIS — G8929 Other chronic pain: Secondary | ICD-10-CM

## 2019-05-07 DIAGNOSIS — R1010 Upper abdominal pain, unspecified: Secondary | ICD-10-CM

## 2019-05-07 DIAGNOSIS — Z2821 Immunization not carried out because of patient refusal: Secondary | ICD-10-CM

## 2019-05-07 MED ORDER — FAMOTIDINE 20 MG PO TABS: 20.0000 mg | ORAL_TABLET | Freq: Two times a day (BID) | ORAL | 2 refills | Status: DC

## 2019-05-07 NOTE — Progress Notes (Signed)
Subjective:    Patient ID: Jessica [place], female    DOB: [birth year], 58 y.o.   MRN: [id number]  HPI This is a 58 yo female who presents today for CPE. He is a Educational psychologist.   Last CPE- 05/01/18 Mammo- 04/17/17 Pap- 2018 Colonoscopy- declines colonoscopy, discussed Cologuard at last annual visit and she agreed but never returned collection.  Tdap-  Due- will get at pharmacy Flu- declines Eye-regualr Dental- 2018 Exercise- walking 2-3 times per week, had been doing sit up until was told not to because of hernia.   Abdominal pain- upper, worse on right side. For several weeks. Under rib cage. No additional meds for pain, feels some fullness after eating. She had CT scan approximately 2 months ago that showed normal peritoneum. She did have a "tiny fat-containing umbilical hernia." Has seen gi in past for dilation of esophagus. No problems with swallowing currently.     Past Medical History:  Diagnosis Date  . GERD (gastroesophageal reflux disease)   . Pain syndrome, chronic    complex region   Past Surgical History:  Procedure Laterality Date  . CARPAL TUNNEL RELEASE  01/2006  . FOOT SURGERY Left 06/18/2015   [facility]  . HAND SURGERY     left  . ROTATOR CUFF REPAIR  08/2000  . thumb surgery     pollicized left   History reviewed. No pertinent family history. Social History   Tobacco Use  . Smoking status: Never Smoker  . Smokeless tobacco: Never Used  Substance Use Topics  . Alcohol use: No    Alcohol/week: 0.0 standard drinks  . Drug use: No     Review of Systems  Constitutional: Negative.   HENT: Negative.   Eyes: Negative.   Respiratory: Negative.   Cardiovascular: Negative.   Gastrointestinal: Positive for abdominal pain and constipation (chronic, BM every 1-3 days. ).  Endocrine: Negative.   Genitourinary: Negative.   Musculoskeletal:       Chronic pain.   Skin: Negative.   Allergic/Immunologic: Negative.   Neurological: Negative.    Hematological: Negative.   Psychiatric/Behavioral: Positive for sleep disturbance (chronic poor sleep due to pain).       Objective:   Physical Exam Vitals signs reviewed.  Constitutional:      General: She is not in acute distress.    Appearance: Normal appearance. She is obese. She is not ill-appearing, toxic-appearing or diaphoretic.  HENT:     Head: Normocephalic and atraumatic.  Eyes:     Conjunctiva/sclera: Conjunctivae normal.  Cardiovascular:     Rate and Rhythm: Normal rate.  Pulmonary:     Effort: Pulmonary effort is normal.  Abdominal:     General: Abdomen is flat. Bowel sounds are normal. There is no distension.     Palpations: Abdomen is soft. There is no mass.     Tenderness: There is abdominal tenderness (tender over bilateral anterior rib cage). There is no guarding.     Hernia: No hernia is present.  Musculoskeletal:        General: Deformity (hands and feet, chronic) present.     Right lower leg: No edema.     Left lower leg: No edema.  Skin:    General: Skin is warm and dry.  Neurological:     Mental Status: She is alert and oriented to person, place, and time.  Psychiatric:        Mood and Affect: Mood normal.        Behavior:  Behavior normal.        Thought Content: Thought content normal.        Judgment: Judgment normal.       BP 98/60 (BP Location: Left Arm, Patient Position: Sitting, Cuff Size: Normal)   Pulse 71   Temp 98.5 F (36.9 C) (Temporal)   Ht 5' 4.25" (1.632 m)   Wt 191 lb 12.8 oz (87 kg)   SpO2 99%   BMI 32.67 kg/m  Wt Readings from Last 3 Encounters:  05/07/19 191 lb 12.8 oz (87 kg)  03/07/19 192 lb (87.1 kg)  12/04/18 192 lb (87.1 kg)       Assessment & Plan:  1. Annual physical exam - Discussed and encouraged healthy lifestyle choices- adequate sleep, regular exercise, stress management and healthy food choices.  - Reviewed lab results that were obtained prior to visit - encouraged her to get Shingrix and Tdap at  her local pharmacy due to insurance coverage  2. Encounter for chronic pain management - Pain Mgmt, Profile 8 w/Conf, U UDS Med Mgt  3. Pain of upper abdomen - unclear etiology, reviewed CT and ultrasound reports and do not think related to small umbilical hernia. Some worsening of symptoms post prandial, will try H2 blocker.  - famotidine (PEPCID) 20 MG tablet; Take 1 tablet (20 mg total) by mouth 2 (two) times daily.  Dispense: 60 tablet; Refill: 2 - offered GI referral but patient prefers to go back to surgeon to evaluate hernia - she was instructed to update me on her condition if no improvement in 7-10 days  4. Influenza vaccination declined  5. Colon cancer screening declined - discussed colonoscopy and Cologuard, patient wants to hold off for the time being  - follow up in 3 months for virtual visit for pain management  Olean Ree, [name]  [place] [facility] at [facility], [facility]  05/07/2019 4:04 PM

## 2019-05-07 NOTE — Patient Instructions (Addendum)
Good to see you today  Please call and schedule an appointment for screening mammogram. A referral is not needed.  [place]  Obtain Tdap and Shingrix (2 shot series) at your pharmacy  Schedule 3 month virtual visit for pain management  I Have sent in stomach medicine to [facility], let me know if not better in 7-10 days   Health Maintenance for Postmenopausal Women Menopause is a normal process in which your ability to get pregnant comes to an end. This process happens slowly over many months or years, usually between the ages of 40 and 88. Menopause is complete when you have missed your menstrual periods for 12 months. It is important to talk with your health care provider about some of the most common conditions that affect women after menopause (postmenopausal women). These include heart disease, cancer, and bone loss (osteoporosis). Adopting a healthy lifestyle and getting preventive care can help to promote your health and wellness. The actions you take can also lower your chances of developing some of these common conditions. What should I know about menopause? During menopause, you may get a number of symptoms, such as:  Hot flashes. These can be moderate or severe.  Night sweats.  Decrease in sex drive.  Mood swings.  Headaches.  Tiredness.  Irritability.  Memory problems.  Insomnia. Choosing to treat or not to treat these symptoms is a decision that you make with your health care provider. Do I need hormone replacement therapy?  Hormone replacement therapy is effective in treating symptoms that are caused by menopause, such as hot flashes and night sweats.  Hormone replacement carries certain risks, especially as you become older. If you are thinking about using estrogen or estrogen with progestin, discuss the benefits and risks with your health care provider. What is my risk for heart disease and stroke? The risk  of heart disease, heart attack, and stroke increases as you age. One of the causes may be a change in the body's hormones during menopause. This can affect how your body uses dietary fats, triglycerides, and cholesterol. Heart attack and stroke are medical emergencies. There are many things that you can do to help prevent heart disease and stroke. Watch your blood pressure  High blood pressure causes heart disease and increases the risk of stroke. This is more likely to develop in people who have high blood pressure readings, are of African descent, or are overweight.  Have your blood pressure checked: ? Every 3-5 years if you are 43-20 years of age. ? Every year if you are 73 years old or older. Eat a healthy diet   Eat a diet that includes plenty of vegetables, fruits, low-fat dairy products, and lean protein.  Do not eat a lot of foods that are high in solid fats, added sugars, or sodium. Get regular exercise Get regular exercise. This is one of the most important things you can do for your health. Most adults should:  Try to exercise for at least 150 minutes each week. The exercise should increase your heart rate and make you sweat (moderate-intensity exercise).  Try to do strengthening exercises at least twice each week. Do these in addition to the moderate-intensity exercise.  Spend less time sitting. Even light physical activity can be beneficial. Other tips  Work with your health care provider to achieve or maintain a healthy weight.  Do not use any products that contain nicotine or tobacco, such as cigarettes, e-cigarettes, and chewing tobacco. If  you need help quitting, ask your health care provider.  Know your numbers. Ask your health care provider to check your cholesterol and your blood sugar (glucose). Continue to have your blood tested as directed by your health care provider. Do I need screening for cancer? Depending on your health history and family history, you may  need to have cancer screening at different stages of your life. This may include screening for:  Breast cancer.  Cervical cancer.  Lung cancer.  Colorectal cancer. What is my risk for osteoporosis? After menopause, you may be at increased risk for osteoporosis. Osteoporosis is a condition in which bone destruction happens more quickly than new bone creation. To help prevent osteoporosis or the bone fractures that can happen because of osteoporosis, you may take the following actions:  If you are 36-68 years old, get at least 1,000 mg of calcium and at least 600 mg of vitamin D per day.  If you are older than age 21 but younger than age 9, get at least 1,200 mg of calcium and at least 600 mg of vitamin D per day.  If you are older than age 70, get at least 1,200 mg of calcium and at least 800 mg of vitamin D per day. Smoking and drinking excessive alcohol increase the risk of osteoporosis. Eat foods that are rich in calcium and vitamin D, and do weight-bearing exercises several times each week as directed by your health care provider. How does menopause affect my mental health? Depression may occur at any age, but it is more common as you become older. Common symptoms of depression include:  Low or sad mood.  Changes in sleep patterns.  Changes in appetite or eating patterns.  Feeling an overall lack of motivation or enjoyment of activities that you previously enjoyed.  Frequent crying spells. Talk with your health care provider if you think that you are experiencing depression. General instructions See your health care provider for regular wellness exams and vaccines. This may include:  Scheduling regular health, dental, and eye exams.  Getting and maintaining your vaccines. These include: ? Influenza vaccine. Get this vaccine each year before the flu season begins. ? Pneumonia vaccine. ? Shingles vaccine. ? Tetanus, diphtheria, and pertussis (Tdap) booster vaccine. Your  health care provider may also recommend other immunizations. Tell your health care provider if you have ever been abused or do not feel safe at home. Summary  Menopause is a normal process in which your ability to get pregnant comes to an end.  This condition causes hot flashes, night sweats, decreased interest in sex, mood swings, headaches, or lack of sleep.  Treatment for this condition may include hormone replacement therapy.  Take actions to keep yourself healthy, including exercising regularly, eating a healthy diet, watching your weight, and checking your blood pressure and blood sugar levels.  Get screened for cancer and depression. Make sure that you are up to date with all your vaccines. This information is not intended to replace advice given to you by your health care provider. Make sure you discuss any questions you have with your health care provider. Document Released: 07/28/2005 Document Revised: 05/29/2018 Document Reviewed: 05/29/2018 Elsevier Patient Education  2020 [employer].

## 2019-05-07 NOTE — Progress Notes (Signed)
Established Patient Office Visit  Subjective:  Patient ID: Jessica Shelton, female    DOB: 08-12-1960  Age: 58 y.o. MRN: [id number]  CC:  Chief Complaint  Patient presents with  . Medicare Wellness    Umbilical hernia pain, burning and grabbing pain in right abdomen    HPI Chad W. Shropshire 58yo white woman presents for her annual physical. Pr reports increase pain in her RUQ. Patient reports pain is worse after meal. She describes pain as 'grabbing'. She noticed increasing pain on her site about couple weeks.    Last CPE-04/2018 Pap-04/2017 Mammo-04/2017 Colonoscopy-declined Tdap-need one today Flu-declined Dental-regularly Eye-2018 Diet-pt cuted on Peps[employer],  Exercise-walking 2-3 time a week about mile  Past Medical History:  Diagnosis Date  . GERD (gastroesophageal reflux disease)   . Pain syndrome, chronic    complex region    Past Surgical History:  Procedure Laterality Date  . CARPAL TUNNEL RELEASE  01/2006  . FOOT SURGERY Left 06/18/2015   [facility]  . HAND SURGERY     left  . ROTATOR CUFF REPAIR  08/2000  . thumb surgery     pollicized left    History reviewed. No pertinent family history.  Social History   Socioeconomic History  . Marital status: Divorced    Spouse name: Not on file  . Number of children: 2  . Years of education: Not on file  . Highest education level: Not on file  Occupational History  . Occupation: Works in Quarry manager: [employer]  . Financial resource strain: Not on file  . Food insecurity    Worry: Not on file    Inability: Not on file  . Transportation needs    Medical: Not on file    Non-medical: Not on file  Tobacco Use  . Smoking status: Never Smoker  . Smokeless tobacco: Never Used  Substance and Sexual Activity  . Alcohol use: No    Alcohol/week: 0.0 standard drinks  . Drug use: No  . Sexual activity: Yes  Lifestyle  . Physical activity    Days per week: Not  on file    Minutes per session: Not on file  . Stress: Not on file  Relationships  . Social Herbalist on phone: Not on file    Gets together: Not on file    Attends religious service: Not on file    Active member of club or organization: Not on file    Attends meetings of clubs or organizations: Not on file    Relationship status: Not on file  . Intimate partner violence    Fear of current or ex partner: Not on file    Emotionally abused: Not on file    Physically abused: Not on file    Forced sexual activity: Not on file  Other Topics Concern  . Not on file  Social History Narrative   Divorced--remarried   Daughter is hyperreflexic, son has polydactyl   Desires CPR   She does have a living will    Outpatient Medications Prior to Visit  Medication Sig Dispense Refill  . albuterol (PROVENTIL HFA;VENTOLIN HFA) 108 (90 Base) MCG/ACT inhaler Inhale 2 puffs into the lungs every 6 (six) hours as needed for wheezing or shortness of breath. 1 Inhaler 2  . hydrochlorothiazide (HYDRODIURIL) 25 MG tablet TAKE 1 TO 2 TABLETS EVERY DAY (SUBSTITUTED FOR HYDRODIURIL) 180 tablet 2  . HYDROcodone-acetaminophen (NORCO/VICODIN) 5-325  MG tablet TAKE 1 TO 2 TABLETS BY MOUTH EVERY SIX HOURS AS NEEDED FOR PAIN 180 tablet 0  . HYDROcodone-acetaminophen (NORCO/VICODIN) 5-325 MG tablet Take 1-2 tablets by mouth every 6 (six) hours as needed for moderate pain. 180 tablet 0  . HYDROcodone-acetaminophen (NORCO/VICODIN) 5-325 MG tablet Take 1-2 tablets by mouth every 6 (six) hours as needed for moderate pain. May fill 30 days after date on prescription 180 tablet 0  . lidocaine (LIDODERM) 5 % 1-2 patches on effected area, leave on for no longer than 12 hours     . ondansetron (ZOFRAN) 4 MG tablet     . tiZANidine (ZANAFLEX) 2 MG tablet Take 1 tablet (2 mg total) by mouth 2 (two) times daily. 180 tablet 3  . traMADol (ULTRAM) 50 MG tablet TAKE 1 TO 2 TABLES (50-100 MG TOTAL) BY MOUTH EVERY 6 HOURS AS  NEEDED 240 tablet 1   No facility-administered medications prior to visit.     Allergies  Allergen Reactions  . Hydromorphone Hcl Anaphylaxis and Itching  . Latex   . Oxycodone-Acetaminophen Anaphylaxis, Rash and Swelling  . Clonidine Hydrochloride   . Codeine     REACTION: anaphylaxis  . Dilaudid [Hydromorphone Hcl] Itching and Dermatitis  . Morphine     REACTION: anaphylaxis  . Oxycodone-Acetaminophen     REACTION: anaphylaxis  . Prednisone     REACTION: difficulty breathing  . Sulfamethoxazole-Trimethoprim     REACTION: rash  . Sulfonamide Derivatives     REACTION: urticaria (hives)    ROS Review of Systems  Constitutional: Negative for activity change, appetite change, chills, fatigue and fever.  HENT: Negative for congestion, ear pain, facial swelling, rhinorrhea and sinus pressure.   Eyes: Negative for pain.  Respiratory: Negative for cough, shortness of breath and wheezing.   Cardiovascular: Negative for chest pain, palpitations and leg swelling.  Gastrointestinal: Positive for abdominal pain. Negative for abdominal distention, constipation, diarrhea, nausea and vomiting.  Genitourinary: Negative for difficulty urinating and flank pain.  Skin: Negative.   Allergic/Immunologic: Negative for environmental allergies.  Neurological: Negative for dizziness, facial asymmetry, weakness and headaches.  Hematological: Negative for adenopathy.      Objective:     Wt Readings from Last 3 Encounters:  05/07/19 87 kg  03/07/19 87.1 kg  12/04/18 87.1 kg   Physical Exam  Constitutional: She is oriented to person, place, and time. She appears well-developed and well-nourished.  HENT:  Head: Normocephalic and atraumatic.  Right Ear: External ear normal.  Left Ear: External ear normal.  Nose: Nose normal.  Mouth/Throat: Oropharynx is clear and moist. No oropharyngeal exudate.  Eyes: Right eye exhibits no discharge. Left eye exhibits no discharge. No scleral icterus.   Neck: Normal range of motion. Neck supple. No tracheal deviation present. No thyromegaly present.  Cardiovascular: Normal rate and regular rhythm. Exam reveals no gallop.  No murmur heard. Pulmonary/Chest: Effort normal and breath sounds normal. She has no wheezes. She has no rales.  Abdominal: Soft. Bowel sounds are normal. She exhibits mass. There is abdominal tenderness.  Umbilical hernia  Musculoskeletal: Normal range of motion.        General: No edema.  Lymphadenopathy:    She has no cervical adenopathy.  Neurological: She is alert and oriented to person, place, and time.  Skin: Skin is warm and dry.    BP 98/60 (BP Location: Left Arm, Patient Position: Sitting, Cuff Size: Normal)   Pulse 71   Temp 98.5 F (36.9 C) (Temporal)   Ht  5' 4.25" (1.632 m)   Wt 87 kg   SpO2 99%   BMI 32.67 kg/m  Wt Readings from Last 3 Encounters:  05/07/19 87 kg  03/07/19 87.1 kg  12/04/18 87.1 kg     Health Maintenance Due  Topic Date Due  . TETANUS/TDAP  06/16/2018  . INFLUENZA VACCINE  01/18/2019  . MAMMOGRAM  04/18/2019    There are no preventive care reminders to display for this patient.  Lab Results  Component Value Date   TSH 3.13 04/10/2017   Lab Results  Component Value Date   WBC 5.3 04/10/2017   HGB 12.3 04/10/2017   HCT 37.7 04/10/2017   MCV 88.7 04/10/2017   PLT 281.0 04/10/2017   Lab Results  Component Value Date   NA 137 04/29/2019   K 3.5 04/29/2019   CO2 28 04/29/2019   GLUCOSE 102 (H) 04/29/2019   BUN 11 04/29/2019   CREATININE 0.62 04/29/2019   BILITOT 0.4 04/29/2019   ALKPHOS 74 04/29/2019   AST 9 04/29/2019   ALT 10 04/29/2019   PROT 8.0 04/29/2019   ALBUMIN 4.5 04/29/2019   CALCIUM 9.9 04/29/2019   GFR 98.30 04/29/2019   Lab Results  Component Value Date   CHOL 215 (H) 04/29/2019   Lab Results  Component Value Date   HDL 62.10 04/29/2019   Lab Results  Component Value Date   LDLCALC 136 (H) 04/29/2019   Lab Results  Component  Value Date   TRIG 87.0 04/29/2019   Lab Results  Component Value Date   CHOLHDL 3 04/29/2019   No results found for: HGBA1C    Assessment & Plan:   1. Encounter for chronic pain management Stable condition, continue with current treatment - Pain Mgmt, Profile 8 w/Conf, U UDS Med Mgt  2. Pain of upper abdomen Worsen. Going to try Pepcid got 1-2 weeks and reevaluate after it - famotidine (PEPCID) 20 MG tablet; Take 1 tablet (20 mg total) by mouth 2 (two) times daily.  Dispense: 60 tablet; Refill: 2  Problem List Items Addressed This Visit    None      No orders of the defined types were placed in this encounter.   Follow-up: No follow-ups on file.    Valentina Gulga N Norlan Rann, RN

## 2019-05-09 LAB — PAIN MGMT, PROFILE 8 W/CONF, U
6 Acetylmorphine: NEGATIVE ng/mL
Alcohol Metabolites: NEGATIVE ng/mL (ref <500)
Amphetamines: NEGATIVE ng/mL
Benzodiazepines: NEGATIVE ng/mL
Buprenorphine, Urine: NEGATIVE ng/mL
Cocaine Metabolite: NEGATIVE ng/mL
Codeine: NEGATIVE ng/mL
Creatinine: 103.4 mg/dL
Hydrocodone: 433 ng/mL
Hydromorphone: 1052 ng/mL
MDMA: NEGATIVE ng/mL
Marijuana Metabolite: NEGATIVE ng/mL
Morphine: NEGATIVE ng/mL
Norhydrocodone: 3381 ng/mL
Opiates: POSITIVE ng/mL
Oxidant: NEGATIVE ug/mL
Oxycodone: NEGATIVE ng/mL
pH: 7.4 (ref 4.5–9.0)

## 2019-06-17 ENCOUNTER — Other Ambulatory Visit: Payer: Self-pay | Admitting: Family Medicine

## 2019-06-17 DIAGNOSIS — G9050 Complex regional pain syndrome I, unspecified: Secondary | ICD-10-CM

## 2019-06-17 DIAGNOSIS — G8929 Other chronic pain: Secondary | ICD-10-CM

## 2019-06-17 NOTE — Telephone Encounter (Signed)
Last OV 03/07/2019 Hydrocodone last refilled 03/07/2019 x 3 Rx's with #180 each, no refills.  Tramadol refilled on 04/11/2019, #240 x 1 refill.   Upcoming visit 08/07/2018  Please advise, thanks.

## 2019-07-09 ENCOUNTER — Other Ambulatory Visit: Payer: Self-pay | Admitting: Family Medicine

## 2019-07-09 DIAGNOSIS — G8929 Other chronic pain: Secondary | ICD-10-CM

## 2019-07-09 DIAGNOSIS — G9050 Complex regional pain syndrome I, unspecified: Secondary | ICD-10-CM

## 2019-07-10 NOTE — Telephone Encounter (Signed)
Last OV 05/07/19 CPE Last Rx 08/07/2018 #180 x 3 refills Upcoming OV 08/08/19  Please advise, thanks.

## 2019-07-14 ENCOUNTER — Other Ambulatory Visit: Payer: Self-pay | Admitting: Family Medicine

## 2019-07-14 DIAGNOSIS — G8929 Other chronic pain: Secondary | ICD-10-CM

## 2019-07-14 DIAGNOSIS — G9050 Complex regional pain syndrome I, unspecified: Secondary | ICD-10-CM

## 2019-07-15 ENCOUNTER — Other Ambulatory Visit: Payer: Self-pay | Admitting: Family Medicine

## 2019-07-15 DIAGNOSIS — G9050 Complex regional pain syndrome I, unspecified: Secondary | ICD-10-CM

## 2019-07-15 DIAGNOSIS — G8929 Other chronic pain: Secondary | ICD-10-CM

## 2019-07-15 MED ORDER — HYDROCODONE-ACETAMINOPHEN 5-325 MG PO TABS: 1.0000 | ORAL_TABLET | Freq: Four times a day (QID) | ORAL | 0 refills | Status: DC | PRN

## 2019-07-15 NOTE — Telephone Encounter (Signed)
Last OV 05/07/2019 CPE Last refill 06/17/2019 #180 x 0 refill Upcoming OV 08/08/2019  Please advise Debbie, thanks.

## 2019-08-08 ENCOUNTER — Ambulatory Visit (INDEPENDENT_AMBULATORY_CARE_PROVIDER_SITE_OTHER): Payer: Medicare HMO | Admitting: Family Medicine

## 2019-08-08 ENCOUNTER — Encounter: Payer: Self-pay | Admitting: Family Medicine

## 2019-08-08 ENCOUNTER — Other Ambulatory Visit: Payer: Self-pay

## 2019-08-08 VITALS — Ht 64.25 in | Wt 185.0 lb

## 2019-08-08 DIAGNOSIS — R1010 Upper abdominal pain, unspecified: Secondary | ICD-10-CM

## 2019-08-08 DIAGNOSIS — G9050 Complex regional pain syndrome I, unspecified: Secondary | ICD-10-CM | POA: Diagnosis not present

## 2019-08-08 DIAGNOSIS — G8929 Other chronic pain: Secondary | ICD-10-CM | POA: Diagnosis not present

## 2019-08-08 DIAGNOSIS — K219 Gastro-esophageal reflux disease without esophagitis: Secondary | ICD-10-CM

## 2019-08-08 MED ORDER — HYDROCODONE-ACETAMINOPHEN 5-325 MG PO TABS: 1.0000 | ORAL_TABLET | Freq: Four times a day (QID) | ORAL | 0 refills | Status: DC | PRN

## 2019-08-08 MED ORDER — OMEPRAZOLE 20 MG PO CPDR: 20.0000 mg | DELAYED_RELEASE_CAPSULE | Freq: Every day | ORAL | 3 refills | Status: DC

## 2019-08-08 NOTE — Progress Notes (Signed)
Virtual Visit via Video Note  I connected with Jessica Shelton on 08/08/19 at 10:30 AM EST by a video enabled telemedicine application and verified that I am speaking with the correct person using two identifiers.  Location: Patient: In her home Provider: In my home Persons participating in virtual visit: patient and provider   I discussed the limitations of evaluation and management by telemedicine and the availability of in person appointments. The patient expressed understanding and agreed to proceed.  History of Present Illness: Chief Complaint  Patient presents with  . Medication Management    Pt denies any issues with medications- does need refills.   This is a 59 yo female who presents today for chronic pain management. Has been on same regimen for many years. Feels that she gets adequate pain relief to function and do her day to day activities.   Hernia- was evaluated in the fall at [facility] for abdominal hernia. At that time, it was decided to continue to monitor and follow up if worsening. Patient reports inability to be seen while Covid 19 numbers were high. She reports worsening of hernia. More difficult to reduce. No constipation or problems with BM. Discomfort at waistline, especially with wearing binding clothing and lying down. Improved with sleeping in recliner. She is planning to call them today to set up an appointment.   GERD- was started on famotidine bid last at last visit. No improvement. Notices more since hernia is worse. Also worse with gravy, heavy foods. Has been working to decrease soda/ caffeine intake.     Observations/Objective: Patient is alert and answers questions appropriately.  Visible skin is unremarkable.  Respirations are even and unlabored, no audible wheeze or witnessed cough.  Ht 5' 4.25" (1.632 m)   Wt 185 lb (83.9 kg)   BMI 31.51 kg/m  Wt Readings from Last 3 Encounters:  08/08/19 185 lb (83.9 kg)  05/07/19 191 lb 12.8 oz (87 kg)  03/07/19  192 lb (87.1 kg)    Assessment and Plan: 1. Encounter for chronic pain management Indication for chronic opioid: RSD, other Medication and dose: hydrocodone-acetominophen 5-325 mg, 1-2 every 6 hours prn # pills per month: 180 Last UDS date: 05/07/2019 Opioid Treatment Agreement signed (Y/N): yes Opioid Treatment Agreement last reviewed with patient:  05/07/2019 [facility] reviewed this encounter (include red flags): Yes Follow-up in 3 months  2. PAIN, CHRONIC NEC - HYDROcodone-acetaminophen (NORCO/VICODIN) 5-325 MG tablet; Take 1-2 tablets by mouth every 6 (six) hours as needed. for pain  Dispense: 180 tablet; Refill: 0 - HYDROcodone-acetaminophen (NORCO/VICODIN) 5-325 MG tablet; Take 1-2 tablets by mouth every 6 (six) hours as needed for moderate pain.  Dispense: 180 tablet; Refill: 0 - HYDROcodone-acetaminophen (NORCO/VICODIN) 5-325 MG tablet; Take 1-2 tablets by mouth every 6 (six) hours as needed for moderate pain. May fill 30 days after date on prescription  Dispense: 180 tablet; Refill: 0  3. Reflex sympathetic dystrophy - HYDROcodone-acetaminophen (NORCO/VICODIN) 5-325 MG tablet; Take 1-2 tablets by mouth every 6 (six) hours as needed. for pain  Dispense: 180 tablet; Refill: 0 - HYDROcodone-acetaminophen (NORCO/VICODIN) 5-325 MG tablet; Take 1-2 tablets by mouth every 6 (six) hours as needed for moderate pain.  Dispense: 180 tablet; Refill: 0 - HYDROcodone-acetaminophen (NORCO/VICODIN) 5-325 MG tablet; Take 1-2 tablets by mouth every 6 (six) hours as needed for moderate pain. May fill 30 days after date on prescription  Dispense: 180 tablet; Refill: 0  4. Gastroesophageal reflux disease, unspecified whether esophagitis present -Increased discomfort with worsening hernia symptoms.  Will start PPI.  Avoid triggers when possible.  Follow-up if no improvement in 2 weeks. - omeprazole (PRILOSEC) 20 MG capsule; Take 1 capsule (20 mg total) by mouth daily.  Dispense: 30 capsule; Refill: 3  5.  Pain of upper abdomen -She is going to call and schedule an appointment to be seen at [facility].  If she is unable to get access within a timely manner she will let me know and I will try to facilitate if needed. -ER precautions reviewed-severe pain, fever, unable to move bowels   Clarene Reamer, [name]  [facility]  08/08/2019 2:38 PM   Follow Up Instructions:    I discussed the assessment and treatment plan with the patient. The patient was provided an opportunity to ask questions and all were answered. The patient agreed with the plan and demonstrated an understanding of the instructions.   The patient was advised to call back or seek an in-person evaluation if the symptoms worsen or if the condition fails to improve as anticipated.    Elby Beck, FNP

## 2019-08-21 DIAGNOSIS — R1010 Upper abdominal pain, unspecified: Secondary | ICD-10-CM | POA: Diagnosis not present

## 2019-09-11 DIAGNOSIS — R1010 Upper abdominal pain, unspecified: Secondary | ICD-10-CM | POA: Diagnosis not present

## 2019-09-11 DIAGNOSIS — R635 Abnormal weight gain: Secondary | ICD-10-CM | POA: Diagnosis not present

## 2019-09-11 DIAGNOSIS — R112 Nausea with vomiting, unspecified: Secondary | ICD-10-CM | POA: Diagnosis not present

## 2019-09-11 DIAGNOSIS — R1011 Right upper quadrant pain: Secondary | ICD-10-CM | POA: Diagnosis not present

## 2019-09-12 ENCOUNTER — Other Ambulatory Visit: Payer: Self-pay | Admitting: Family Medicine

## 2019-09-12 DIAGNOSIS — G8929 Other chronic pain: Secondary | ICD-10-CM

## 2019-09-12 DIAGNOSIS — G9050 Complex regional pain syndrome I, unspecified: Secondary | ICD-10-CM

## 2019-10-01 ENCOUNTER — Other Ambulatory Visit: Payer: Self-pay | Admitting: Family Medicine

## 2019-10-01 DIAGNOSIS — M7989 Other specified soft tissue disorders: Secondary | ICD-10-CM

## 2019-10-02 ENCOUNTER — Other Ambulatory Visit: Payer: Self-pay | Admitting: Family Medicine

## 2019-10-02 DIAGNOSIS — Z1231 Encounter for screening mammogram for malignant neoplasm of breast: Secondary | ICD-10-CM

## 2019-10-02 NOTE — Telephone Encounter (Signed)
Last apt 08/08/19 Next apt: none scheduled Sent in script

## 2019-10-14 DIAGNOSIS — Z6832 Body mass index (BMI) 32.0-32.9, adult: Secondary | ICD-10-CM | POA: Diagnosis not present

## 2019-10-14 DIAGNOSIS — E7800 Pure hypercholesterolemia, unspecified: Secondary | ICD-10-CM | POA: Diagnosis not present

## 2019-10-14 DIAGNOSIS — E669 Obesity, unspecified: Secondary | ICD-10-CM | POA: Diagnosis not present

## 2019-11-05 ENCOUNTER — Telehealth (INDEPENDENT_AMBULATORY_CARE_PROVIDER_SITE_OTHER): Payer: Medicare HMO | Admitting: Family Medicine

## 2019-11-05 ENCOUNTER — Encounter: Payer: Self-pay | Admitting: Family Medicine

## 2019-11-05 DIAGNOSIS — G9050 Complex regional pain syndrome I, unspecified: Secondary | ICD-10-CM

## 2019-11-05 DIAGNOSIS — K219 Gastro-esophageal reflux disease without esophagitis: Secondary | ICD-10-CM

## 2019-11-05 DIAGNOSIS — G8929 Other chronic pain: Secondary | ICD-10-CM

## 2019-11-05 MED ORDER — HYDROCODONE-ACETAMINOPHEN 5-325 MG PO TABS: 1.0000 | ORAL_TABLET | Freq: Four times a day (QID) | ORAL | 0 refills | Status: DC | PRN

## 2019-11-05 MED ORDER — OMEPRAZOLE 20 MG PO CPDR: 20.0000 mg | DELAYED_RELEASE_CAPSULE | Freq: Every day | ORAL | 0 refills | Status: AC

## 2019-11-05 MED ORDER — TRAMADOL HCL 50 MG PO TABS: 50.0000 mg | ORAL_TABLET | Freq: Four times a day (QID) | ORAL | 0 refills | Status: DC | PRN

## 2019-11-05 NOTE — Progress Notes (Signed)
Virtual Visit via Video Note  I connected with Jessica Shelton on 11/05/19 at  9:30 AM EDT by a video enabled telemedicine application and verified that I am speaking with the correct person using two identifiers.  Location: Patient: In her home Provider: [facility] Persons participating in virtual visit: Patient and provider   I discussed the limitations of evaluation and management by telemedicine and the availability of in person appointments. The patient expressed understanding and agreed to proceed.  History of Present Illness: Chief Complaint  Patient presents with  . Follow-up    No new concerns   This is a 59 year old female who presents today via virtual visit for chronic pain management.  She reports that she has been doing well.  She is recently been evaluated by [facility] for weight loss management.  She has lost 7 or 8 pounds by watching her diet.  She has decreased her Pepsi intake significantly.  She was started on topiramate.  She reports some numbness and tingling of her hands and attributes this to topiramate.  As recommended, she has worked on decreasing her tramadol but notes that her pain is worse and she has only been sleeping a couple of hours at night.  She has follow-up with weight loss team in a couple of weeks.  She has been vaccinated against COVID-19.  She has an umbilical hernia that was evaluated by Dr. Brayton Layman at [facility].  They are currently watching as it is not requiring surgery at this time.  She continues on hydrocodone acetaminophen 5-3 25, 1 to 2 tablets every 6 hours as needed which makes her pain manageable.  She denies any problems with constipation, oversedation.  She continues to be the primary caregiver of her 3 grandchildren.  GERD-good control with omeprazole 20 mg.   Observations/Objective: Patient is alert and answers questions appropriately.  Visible skin is unremarkable.  Respirations are even  and unlabored without increased work of breathing.  No audible wheeze or witnessed cough.  Mood and affect are appropriate. BP 131/87   Pulse 62   Temp (!) 97 F (36.1 C)   Wt 188 lb (85.3 kg)   BMI 32.02 kg/m  Wt Readings from Last 3 Encounters:  11/05/19 188 lb (85.3 kg)  08/08/19 185 lb (83.9 kg)  05/07/19 191 lb 12.8 oz (87 kg)    Assessment and Plan: 1. PAIN, CHRONIC NEC Indication for chronic opioid: reflex sympathetic dystrophy Medication and dose: Hydrocodone-acetaminophen 5-3 25-325 mg, 1-2 every 6 hours as needed # pills per month: 180 Last UDS date: 04/2019 Opioid Treatment Agreement signed (Y/N): yes Opioid Treatment Agreement last reviewed with patient:   04/2019 NCCSRS reviewed this encounter (include red flags): Yes, no red flags - traMADol (ULTRAM) 50 MG tablet; Take 1-2 tablets (50-100 mg total) by mouth every 6 (six) hours as needed for severe pain.  Dispense: 240 tablet; Refill: 0 - HYDROcodone-acetaminophen (NORCO/VICODIN) 5-325 MG tablet; Take 1-2 tablets by mouth every 6 (six) hours as needed. for pain  Dispense: 180 tablet; Refill: 0 - HYDROcodone-acetaminophen (NORCO/VICODIN) 5-325 MG tablet; Take 1-2 tablets by mouth every 6 (six) hours as needed for moderate pain.  Dispense: 180 tablet; Refill: 0 - HYDROcodone-acetaminophen (NORCO/VICODIN) 5-325 MG tablet; Take 1-2 tablets by mouth every 6 (six) hours as needed for moderate pain. May fill 30 days after date on prescription  Dispense: 180 tablet; Refill: 0 -Follow-up in 3 months  2. Reflex sympathetic dystrophy - traMADol (ULTRAM) 50  MG tablet; Take 1-2 tablets (50-100 mg total) by mouth every 6 (six) hours as needed for severe pain.  Dispense: 240 tablet; Refill: 0 - HYDROcodone-acetaminophen (NORCO/VICODIN) 5-325 MG tablet; Take 1-2 tablets by mouth every 6 (six) hours as needed. for pain  Dispense: 180 tablet; Refill: 0 - HYDROcodone-acetaminophen (NORCO/VICODIN) 5-325 MG tablet; Take 1-2 tablets by mouth  every 6 (six) hours as needed for moderate pain.  Dispense: 180 tablet; Refill: 0 - HYDROcodone-acetaminophen (NORCO/VICODIN) 5-325 MG tablet; Take 1-2 tablets by mouth every 6 (six) hours as needed for moderate pain. May fill 30 days after date on prescription  Dispense: 180 tablet; Refill: 0  3. Gastroesophageal reflux disease, unspecified whether esophagitis present - omeprazole (PRILOSEC) 20 MG capsule; Take 1 capsule (20 mg total) by mouth daily.  Dispense: 90 capsule; Refill: 0   Clarene Reamer, [name]  [facility]  11/05/2019 10:15 AM   Follow Up Instructions:    I discussed the assessment and treatment plan with the patient. The patient was provided an opportunity to ask questions and all were answered. The patient agreed with the plan and demonstrated an understanding of the instructions.   The patient was advised to call back or seek an in-person evaluation if the symptoms worsen or if the condition fails to improve as anticipated.   Elby Beck, FNP

## 2019-11-26 ENCOUNTER — Other Ambulatory Visit: Payer: Self-pay

## 2019-11-26 ENCOUNTER — Ambulatory Visit
Admission: RE | Admit: 2019-11-26 | Discharge: 2019-11-26 | Disposition: A | Discharge status: Home / Self Care | Payer: Medicare HMO | Source: Ambulatory Visit | Attending: Family Medicine | Admitting: Family Medicine

## 2019-11-26 DIAGNOSIS — Z1231 Encounter for screening mammogram for malignant neoplasm of breast: Secondary | ICD-10-CM

## 2019-11-27 DIAGNOSIS — E559 Vitamin D deficiency, unspecified: Secondary | ICD-10-CM | POA: Diagnosis not present

## 2019-11-27 DIAGNOSIS — E785 Hyperlipidemia, unspecified: Secondary | ICD-10-CM | POA: Diagnosis not present

## 2019-11-27 DIAGNOSIS — E669 Obesity, unspecified: Secondary | ICD-10-CM | POA: Diagnosis not present

## 2019-11-27 DIAGNOSIS — R635 Abnormal weight gain: Secondary | ICD-10-CM | POA: Diagnosis not present

## 2019-11-27 DIAGNOSIS — R5383 Other fatigue: Secondary | ICD-10-CM | POA: Diagnosis not present

## 2019-11-27 DIAGNOSIS — R799 Abnormal finding of blood chemistry, unspecified: Secondary | ICD-10-CM | POA: Diagnosis not present

## 2019-11-27 DIAGNOSIS — Z6831 Body mass index (BMI) 31.0-31.9, adult: Secondary | ICD-10-CM | POA: Diagnosis not present

## 2019-12-18 ENCOUNTER — Other Ambulatory Visit: Payer: Self-pay | Admitting: Family Medicine

## 2019-12-18 DIAGNOSIS — G9050 Complex regional pain syndrome I, unspecified: Secondary | ICD-10-CM

## 2019-12-18 DIAGNOSIS — G8929 Other chronic pain: Secondary | ICD-10-CM

## 2019-12-18 NOTE — Telephone Encounter (Signed)
Last OV 11/05/19 Last refilled 11/05/19 #240 x 0 refills.   Please advise, thanks.

## 2020-01-01 DIAGNOSIS — R69 Illness, unspecified: Secondary | ICD-10-CM | POA: Diagnosis not present

## 2020-01-22 DIAGNOSIS — Z6830 Body mass index (BMI) 30.0-30.9, adult: Secondary | ICD-10-CM | POA: Diagnosis not present

## 2020-01-22 DIAGNOSIS — E669 Obesity, unspecified: Secondary | ICD-10-CM | POA: Diagnosis not present

## 2020-01-22 DIAGNOSIS — R7303 Prediabetes: Secondary | ICD-10-CM | POA: Insufficient documentation

## 2020-01-22 DIAGNOSIS — E538 Deficiency of other specified B group vitamins: Secondary | ICD-10-CM | POA: Diagnosis not present

## 2020-01-22 DIAGNOSIS — E559 Vitamin D deficiency, unspecified: Secondary | ICD-10-CM | POA: Diagnosis not present

## 2020-03-20 ENCOUNTER — Other Ambulatory Visit: Payer: Self-pay | Admitting: Family Medicine

## 2020-03-20 DIAGNOSIS — G9050 Complex regional pain syndrome I, unspecified: Secondary | ICD-10-CM

## 2020-03-20 DIAGNOSIS — G8929 Other chronic pain: Secondary | ICD-10-CM

## 2020-03-20 NOTE — Telephone Encounter (Signed)
Currently no upcoming appt. Video visit 05/192021 Last refill 01/27/2020

## 2020-03-24 NOTE — Telephone Encounter (Signed)
Jessica Shelton, I scheduled Jessica Shelton a 15 minute virtual visit for pain medication refill.on 03/26/2020.  UDS and contract not due until November 2021.  Let me know if this is not doable.

## 2020-03-24 NOTE — Telephone Encounter (Signed)
Video visit is acceptable.

## 2020-03-26 ENCOUNTER — Other Ambulatory Visit: Payer: Self-pay

## 2020-03-26 ENCOUNTER — Telehealth (INDEPENDENT_AMBULATORY_CARE_PROVIDER_SITE_OTHER): Payer: Medicare HMO | Admitting: Family Medicine

## 2020-03-26 VITALS — BP 120/78 | HR 61 | Temp 97.0°F | Wt 171.0 lb

## 2020-03-26 DIAGNOSIS — G8929 Other chronic pain: Secondary | ICD-10-CM

## 2020-03-26 DIAGNOSIS — E663 Overweight: Secondary | ICD-10-CM | POA: Diagnosis not present

## 2020-03-26 DIAGNOSIS — G9050 Complex regional pain syndrome I, unspecified: Secondary | ICD-10-CM | POA: Diagnosis not present

## 2020-03-26 MED ORDER — HYDROCODONE-ACETAMINOPHEN 5-325 MG PO TABS: 1.0000 | ORAL_TABLET | Freq: Four times a day (QID) | ORAL | 0 refills | Status: DC | PRN

## 2020-03-26 NOTE — Progress Notes (Signed)
Virtual Visit via Video Note  I connected with Jessica Shelton on 03/26/20 at 10:15 AM EDT by a video enabled telemedicine application and verified that I am speaking with the correct person using two identifiers.  Location: Patient: In her home Provider: [facility] Persons participating in virtual visit: Patient, provider   I discussed the limitations of evaluation and management by telemedicine and the availability of in person appointments. The patient expressed understanding and agreed to proceed.  History of Present Illness: Chief Complaint  Patient presents with  . Medication Management   This is a 59 year old female who presents today for virtual visit for chronic pain management.  Due to pandemic, household members with high risk Covid complications we are trying to keep her out of the office as much as possible.  Chronic pain-patient with longstanding chronic pain, reflex sympathetic dystrophy, has been maintained on same chronic pain medication regimen for many years.  Is followed at [facility] for orthopedic issues.  Has noticed increased pain of her right foot.  Increased pain of fifth toe of right foot with increased pain of the leg.  She intends to follow-up with her regular orthopedist for x-rays and plan of care.  Obesity-patient has lost 17 pounds working with [facility] bariatric program.  She has made substantial dietary changes and she is walking 3 miles a day.  She has significant pain with walking.  No improvement of chronic pain with weight loss.   Observations/Objective: Patient is alert and answers questions appropriately.  Visible skin is unremarkable.  Respirations are even and unlabored without increased work of breathing.  No audible wheeze or witnessed cough.  Mood and affect are appropriate. BP 120/78   Pulse 61   Temp (!) 97 F (36.1 C) (Oral)   Wt 171 lb (77.6 kg)   BMI 29.12 kg/m  Wt Readings from Last 3  Encounters:  03/26/20 171 lb (77.6 kg)  11/05/19 188 lb (85.3 kg)  08/08/19 185 lb (83.9 kg)    Assessment and Plan: 1. PAIN, CHRONIC NEC -She has an office follow-up visit next month, will provide enough medication to get her through until then.  She will be due for urine drug screen and pain management agreement updating. - HYDROcodone-acetaminophen (NORCO/VICODIN) 5-325 MG tablet; Take 1-2 tablets by mouth every 6 (six) hours as needed. for pain  Dispense: 180 tablet; Refill: 0 - HYDROcodone-acetaminophen (NORCO/VICODIN) 5-325 MG tablet; Take 1-2 tablets by mouth every 6 (six) hours as needed for moderate pain. May fill 30 days after date on prescription  Dispense: 180 tablet; Refill: 0  2. Reflex sympathetic dystrophy - HYDROcodone-acetaminophen (NORCO/VICODIN) 5-325 MG tablet; Take 1-2 tablets by mouth every 6 (six) hours as needed. for pain  Dispense: 180 tablet; Refill: 0 - HYDROcodone-acetaminophen (NORCO/VICODIN) 5-325 MG tablet; Take 1-2 tablets by mouth every 6 (six) hours as needed for moderate pain. May fill 30 days after date on prescription  Dispense: 180 tablet; Refill: 0  3. Overweight (BMI 25.0-29.9) -She has had successful weight loss with [facility] program, encouraged her efforts   Olean Ree, [name]  [facility]  03/26/2020 10:39 AM   Follow Up Instructions:    I discussed the assessment and treatment plan with the patient. The patient was provided an opportunity to ask questions and all were answered. The patient agreed with the plan and demonstrated an understanding of the instructions.   The patient  was advised to call back or seek an in-person evaluation if the symptoms worsen or if the condition fails to improve as anticipated.  Elby Beck, FNP

## 2020-03-27 ENCOUNTER — Encounter: Payer: Self-pay | Admitting: Family Medicine

## 2020-04-20 DIAGNOSIS — R7303 Prediabetes: Secondary | ICD-10-CM | POA: Diagnosis not present

## 2020-04-20 DIAGNOSIS — Z6829 Body mass index (BMI) 29.0-29.9, adult: Secondary | ICD-10-CM | POA: Diagnosis not present

## 2020-04-20 DIAGNOSIS — E669 Obesity, unspecified: Secondary | ICD-10-CM | POA: Diagnosis not present

## 2020-04-20 DIAGNOSIS — E538 Deficiency of other specified B group vitamins: Secondary | ICD-10-CM | POA: Diagnosis not present

## 2020-04-20 DIAGNOSIS — E559 Vitamin D deficiency, unspecified: Secondary | ICD-10-CM | POA: Diagnosis not present

## 2020-04-20 DIAGNOSIS — G4700 Insomnia, unspecified: Secondary | ICD-10-CM | POA: Diagnosis not present

## 2020-04-21 ENCOUNTER — Other Ambulatory Visit: Payer: Self-pay | Admitting: Family Medicine

## 2020-04-21 DIAGNOSIS — G8929 Other chronic pain: Secondary | ICD-10-CM

## 2020-04-21 DIAGNOSIS — G9050 Complex regional pain syndrome I, unspecified: Secondary | ICD-10-CM

## 2020-04-22 NOTE — Telephone Encounter (Signed)
LAST APPOINTMENT DATE: 03/26/2020   NEXT APPOINTMENT DATE: Visit date not found    LAST REFILL: 07/11/2019  QTY:180 no rf

## 2020-04-30 DIAGNOSIS — G90511 Complex regional pain syndrome I of right upper limb: Secondary | ICD-10-CM | POA: Diagnosis not present

## 2020-04-30 DIAGNOSIS — M4726 Other spondylosis with radiculopathy, lumbar region: Secondary | ICD-10-CM | POA: Diagnosis not present

## 2020-04-30 DIAGNOSIS — M961 Postlaminectomy syndrome, not elsewhere classified: Secondary | ICD-10-CM | POA: Diagnosis not present

## 2020-04-30 DIAGNOSIS — Q690 Accessory finger(s): Secondary | ICD-10-CM | POA: Diagnosis not present

## 2020-05-04 DIAGNOSIS — M8589 Other specified disorders of bone density and structure, multiple sites: Secondary | ICD-10-CM | POA: Diagnosis not present

## 2020-05-19 ENCOUNTER — Other Ambulatory Visit: Payer: Self-pay | Admitting: Family Medicine

## 2020-05-19 DIAGNOSIS — M7989 Other specified soft tissue disorders: Secondary | ICD-10-CM

## 2020-05-20 NOTE — Telephone Encounter (Signed)
Pharmacy requests refill on: Hydrochlorothiazide 25 mg   LAST REFILL: 10/02/2019 (Q-180, R-2) LAST OV: 08/08/2019 NEXT OV: 07/05/2020 PHARMACY: [facility] [place], [place]   Too early for refill

## 2020-05-29 DIAGNOSIS — M961 Postlaminectomy syndrome, not elsewhere classified: Secondary | ICD-10-CM | POA: Diagnosis not present

## 2020-05-29 DIAGNOSIS — M546 Pain in thoracic spine: Secondary | ICD-10-CM | POA: Diagnosis not present

## 2020-05-29 DIAGNOSIS — G90511 Complex regional pain syndrome I of right upper limb: Secondary | ICD-10-CM | POA: Diagnosis not present

## 2020-06-01 ENCOUNTER — Other Ambulatory Visit: Payer: Self-pay | Admitting: Family Medicine

## 2020-06-01 DIAGNOSIS — G8929 Other chronic pain: Secondary | ICD-10-CM

## 2020-06-01 DIAGNOSIS — G9050 Complex regional pain syndrome I, unspecified: Secondary | ICD-10-CM

## 2020-06-01 NOTE — Telephone Encounter (Signed)
Name of Medication: Hydrocodone-Acetaminophen 5-325 mg  Name of Pharmacy: [facility]  Last Fill or Written Date and Quantity: 11/05/2019 (Q-180, R-0) Last Office Visit and Type: 08/08/2019 Next Office Visit and Type: 07/05/2020 Last Controlled Substance Agreement Date: 05/07/2019 Last UDS: 05/07/2019  Pharmacy requests refill on: Tramadol 50 mg   LAST REFILL: 12/19/2019 (Q-240, R-1) LAST OV: 08/08/2019 NEXT OV: 07/05/2020 PHARMACY: [facility]

## 2020-06-02 NOTE — Telephone Encounter (Signed)
Last office visit was 03/26/2020.  It was a virtual video visit.

## 2020-06-28 ENCOUNTER — Other Ambulatory Visit: Payer: Medicare HMO

## 2020-06-29 DIAGNOSIS — M8580 Other specified disorders of bone density and structure, unspecified site: Secondary | ICD-10-CM | POA: Insufficient documentation

## 2020-06-29 DIAGNOSIS — E538 Deficiency of other specified B group vitamins: Secondary | ICD-10-CM | POA: Insufficient documentation

## 2020-06-29 DIAGNOSIS — E663 Overweight: Secondary | ICD-10-CM | POA: Diagnosis not present

## 2020-07-05 ENCOUNTER — Encounter: Payer: Medicare HMO | Admitting: Family Medicine

## 2020-07-12 ENCOUNTER — Telehealth: Payer: Self-pay | Admitting: *Deleted

## 2020-07-12 DIAGNOSIS — N644 Mastodynia: Secondary | ICD-10-CM

## 2020-07-12 DIAGNOSIS — N63 Unspecified lump in unspecified breast: Secondary | ICD-10-CM

## 2020-07-12 NOTE — Telephone Encounter (Signed)
Let her know that I am working on the order and please route this back to me.  I need to know to the location of the L breast lumps.  Thanks.

## 2020-07-12 NOTE — Telephone Encounter (Signed)
Patient states its on her left breast on the side near arm pit but on the breast and theres another one of the top of the left breast. Patient states she has also had some burning on her breast and side where her ribs are on the left for about 1 week.

## 2020-07-12 NOTE — Telephone Encounter (Signed)
Patient called stating that she has an appointment scheduled with Dr. Para March as a new patient Thursday of this week. Patient stated that she was seeing Deboraha Sprang NP. Patient stated that she has found lumps in her left breast and both breast are painful. Patient wants to know if Dr. Para March can order mammograms and ultrasound before she sees him. Patient stated that she does not want to delay this. Patient stated that her last mammogram in June was fine. Patient stated that she called to schedule a mammogram and was advised that her doctor would have to order this and she is between providers.

## 2020-07-13 NOTE — Telephone Encounter (Signed)
Patient aware orders were put in and she can call to schedule.

## 2020-07-13 NOTE — Telephone Encounter (Signed)
I put in the orders.  I don't know when she will be scheduled.

## 2020-07-14 NOTE — Telephone Encounter (Signed)
Please sign and close encounter when completed. 

## 2020-07-15 ENCOUNTER — Encounter: Payer: Self-pay | Admitting: Family Medicine

## 2020-07-15 ENCOUNTER — Ambulatory Visit (INDEPENDENT_AMBULATORY_CARE_PROVIDER_SITE_OTHER): Payer: Medicare HMO | Admitting: Family Medicine

## 2020-07-15 ENCOUNTER — Other Ambulatory Visit: Payer: Self-pay

## 2020-07-15 DIAGNOSIS — G9050 Complex regional pain syndrome I, unspecified: Secondary | ICD-10-CM

## 2020-07-15 DIAGNOSIS — N63 Unspecified lump in unspecified breast: Secondary | ICD-10-CM

## 2020-07-15 DIAGNOSIS — G8929 Other chronic pain: Secondary | ICD-10-CM | POA: Diagnosis not present

## 2020-07-15 DIAGNOSIS — M7989 Other specified soft tissue disorders: Secondary | ICD-10-CM

## 2020-07-15 MED ORDER — HYDROCHLOROTHIAZIDE 25 MG PO TABS: 50.0000 mg | ORAL_TABLET | Freq: Every day | ORAL | Status: DC

## 2020-07-15 MED ORDER — HYDROCODONE-ACETAMINOPHEN 5-325 MG PO TABS
1.0000 | ORAL_TABLET | Freq: Four times a day (QID) | ORAL | 0 refills | Status: DC | PRN
Start: 2020-07-15 — End: 2020-11-28

## 2020-07-15 MED ORDER — HYDROCODONE-ACETAMINOPHEN 5-325 MG PO TABS: 1.0000 | ORAL_TABLET | Freq: Four times a day (QID) | ORAL | 0 refills | Status: DC | PRN

## 2020-07-15 MED ORDER — TRAMADOL HCL 50 MG PO TABS: ORAL_TABLET | ORAL | 2 refills | Status: DC

## 2020-07-15 MED ORDER — HYDROCODONE-ACETAMINOPHEN 5-325 MG PO TABS: ORAL_TABLET | ORAL | 0 refills | Status: DC

## 2020-07-15 NOTE — Progress Notes (Signed)
This visit occurred during the SARS-CoV-2 public health emergency.  Safety protocols were in place, including screening questions prior to the visit, additional usage of staff PPE, and extensive cleaning of exam room while observing appropriate contact time as indicated for disinfecting solutions.  Transfer of care.  History of RSD and neuropathy.    She can tolerate hydrocodone and tramadol.  Allergy list d/w pt.  Mult surgeries, born with polydactyly.  B wrist fusion, thumb fusion.  Chronic pain.  Pain is 24/7, every day.  Pain medicine decreases the severity but she isn't pain free.  No ADE on meds. Not constipated.  Not sedated.  Had seen pain clinic prev.  She is considering spinal cord stimulator.    Son has polydactyly.  Daughter with RSD, with a stimulator in place.    Breast mass.  See prev orders/phone note.  She is schedule for imaging next week, on cancellation list.   She had covid vaccine.  Discussed.  Encouraged flu vaccine.   Husband designated if patient were incapacitated.    Labs done at [facility] 04/2020.  Noted.  Meds, vitals, and allergies reviewed.   ROS: Per HPI unless specifically indicated in ROS section   Chaperoned exam nad ncat Neck supple, no LA rrr ctab 2 tender masses, 1 lateral and 1 inferior to L nipple.  No LA B.  No R breast mass.   Ext w/o edema She has chronic postsurgical changes on the bilateral upper extremities.   30 minutes were devoted to patient care in this encounter (this includes time spent reviewing the patient's file/history, interviewing and examining the patient, counseling/reviewing plan with patient).

## 2020-07-15 NOTE — Patient Instructions (Signed)
Don't change your meds for now.  I'll await your mammogram report.  Take care.  Glad to see you.

## 2020-07-18 ENCOUNTER — Encounter: Payer: Self-pay | Admitting: Family Medicine

## 2020-07-18 DIAGNOSIS — Z7189 Other specified counseling: Secondary | ICD-10-CM | POA: Insufficient documentation

## 2020-07-18 DIAGNOSIS — N63 Unspecified lump in unspecified breast: Secondary | ICD-10-CM | POA: Insufficient documentation

## 2020-07-18 NOTE — Assessment & Plan Note (Signed)
With follow-up imaging pending.  Discussed with patient.  She agrees.  Will await imaging.

## 2020-07-18 NOTE — Assessment & Plan Note (Signed)
With chronic pain, able to tolerate hydrocodone and tramadol.  Allergy list discussed with patient.  She is having pain every day but she is able to get by with her current medications.  She seen the pain clinic previously and she is considering a spinal cord stimulator.  Reasonable to continue as is for now.  No sedation on medication.  Continue tramadol and hydrocodone.

## 2020-07-23 ENCOUNTER — Ambulatory Visit
Admission: RE | Admit: 2020-07-23 | Discharge: 2020-07-23 | Disposition: A | Discharge status: Home / Self Care | Payer: Medicare HMO | Source: Ambulatory Visit | Attending: Family Medicine | Admitting: Family Medicine

## 2020-07-23 ENCOUNTER — Other Ambulatory Visit: Payer: Self-pay

## 2020-07-23 ENCOUNTER — Ambulatory Visit: Admission: RE | Admit: 2020-07-23 | Payer: Medicare HMO | Source: Ambulatory Visit

## 2020-07-23 DIAGNOSIS — N644 Mastodynia: Secondary | ICD-10-CM | POA: Diagnosis not present

## 2020-07-23 DIAGNOSIS — N63 Unspecified lump in unspecified breast: Secondary | ICD-10-CM

## 2020-07-23 DIAGNOSIS — R928 Other abnormal and inconclusive findings on diagnostic imaging of breast: Secondary | ICD-10-CM | POA: Diagnosis not present

## 2020-09-20 ENCOUNTER — Telehealth: Payer: Self-pay

## 2020-09-20 ENCOUNTER — Telehealth: Payer: Self-pay | Admitting: Family Medicine

## 2020-09-20 NOTE — Telephone Encounter (Signed)
LVM for pt to call my direct line regarding her paperwork

## 2020-09-20 NOTE — Telephone Encounter (Signed)
Patient dropped off paperwork for disability. Patient's requesting disability paperwork be filled out and faxed to Barnetta [employer]ersmith fax number [phone]. Paperwork given to [employer].

## 2020-09-20 NOTE — Telephone Encounter (Signed)
Disability paperwork semi filled out  Placed in PCP's inbox for completion, sign and date

## 2020-09-20 NOTE — Telephone Encounter (Signed)
Pt returned phone call  Beginning disability paperwork

## 2020-09-21 NOTE — Telephone Encounter (Signed)
I will work on the hardcopy.  Thanks. 

## 2020-09-30 NOTE — Telephone Encounter (Signed)
Thanks.  I'll work on it.

## 2020-09-30 NOTE — Telephone Encounter (Signed)
Spoke w pt to complete paperwork  Placed in PCP's inbox for review, completion, sign and date

## 2020-10-06 NOTE — Telephone Encounter (Signed)
Called pt to inform disability paperwork completed and faxed  Copy for pt at front desk  Copy for scan  Copy retained by me

## 2020-10-19 ENCOUNTER — Other Ambulatory Visit: Payer: Self-pay

## 2020-10-19 MED ORDER — HYDROCHLOROTHIAZIDE 25 MG PO TABS: 50.0000 mg | ORAL_TABLET | Freq: Every day | ORAL | 2 refills | Status: DC

## 2020-11-25 ENCOUNTER — Other Ambulatory Visit: Payer: Self-pay | Admitting: Family Medicine

## 2020-11-25 DIAGNOSIS — G8929 Other chronic pain: Secondary | ICD-10-CM

## 2020-11-25 DIAGNOSIS — G9050 Complex regional pain syndrome I, unspecified: Secondary | ICD-10-CM

## 2020-11-26 NOTE — Telephone Encounter (Signed)
Name of Medication: Hydrocodone Name of Pharmacy: [facility] Date and Quantity: 09-13-20 #180 Last Office Visit and Type: 07-15-20 Next Office Visit and Type: No Future OV Last Controlled Substance Agreement Date: 12-31-17 Last UDS: 05-07-19

## 2020-11-28 MED ORDER — HYDROCODONE-ACETAMINOPHEN 5-325 MG PO TABS: 1.0000 | ORAL_TABLET | Freq: Four times a day (QID) | ORAL | 0 refills | Status: DC | PRN

## 2020-11-28 MED ORDER — HYDROCODONE-ACETAMINOPHEN 5-325 MG PO TABS: ORAL_TABLET | ORAL | 0 refills | Status: DC

## 2020-11-28 NOTE — Telephone Encounter (Signed)
Sent. Thanks.   Needs f/u re: pain meds this summer.

## 2020-12-01 NOTE — Telephone Encounter (Signed)
Patient called back and scheduled follow up on 12/09/20

## 2020-12-01 NOTE — Telephone Encounter (Signed)
Tried to call patient twice but patient has a google assist thing on her phone that screens and could not get thru nor could I leave a message.

## 2020-12-09 ENCOUNTER — Ambulatory Visit (INDEPENDENT_AMBULATORY_CARE_PROVIDER_SITE_OTHER): Payer: Medicare HMO | Admitting: Family Medicine

## 2020-12-09 ENCOUNTER — Other Ambulatory Visit: Payer: Self-pay

## 2020-12-09 ENCOUNTER — Encounter: Payer: Self-pay | Admitting: Family Medicine

## 2020-12-09 VITALS — BP 120/76 | HR 59 | Temp 97.4°F | Ht 64.0 in | Wt 170.0 lb

## 2020-12-09 DIAGNOSIS — E559 Vitamin D deficiency, unspecified: Secondary | ICD-10-CM | POA: Diagnosis not present

## 2020-12-09 DIAGNOSIS — E538 Deficiency of other specified B group vitamins: Secondary | ICD-10-CM | POA: Diagnosis not present

## 2020-12-09 DIAGNOSIS — G8929 Other chronic pain: Secondary | ICD-10-CM | POA: Diagnosis not present

## 2020-12-09 LAB — VITAMIN D 25 HYDROXY (VIT D DEFICIENCY, FRACTURES): VITD: 21.14 ng/mL — ABNORMAL LOW (ref 30.00–100.00)

## 2020-12-09 LAB — VITAMIN B12: Vitamin B-12: 146 pg/mL — ABNORMAL LOW (ref 211–911)

## 2020-12-09 NOTE — Patient Instructions (Addendum)
Go to the lab on the way out.   If you have mychart we'll likely use that to update you.    Take care.  Glad to see you. Update me as needed. Please schedule a physical for the fall, labs ahead of time if possible.

## 2020-12-09 NOTE — Progress Notes (Signed)
This visit occurred during the SARS-CoV-2 public health emergency.  Safety protocols were in place, including screening questions prior to the visit, additional usage of staff PPE, and extensive cleaning of exam room while observing appropriate contact time as indicated for disinfecting solutions.  Indication for chronic opioid: chronic joint pain.   Medication and dose: tramadol/hydrocodone.   # pills per month: tramadol ~BID, 6 hydrocodone per day.   Last UDS date: 05/07/2019 Pain contract signed (Y/N): yes Date narcotic database last reviewed (include red flags): 12/09/20   Pain inventory (1-10) Average pain: 7/10 Pain now: 7/10 My pain is constant Pain is worse with more activity Relief from meds: yes, "a little."  Stable dosing for years  In the last 24 hours, how much has pain interfered with the following (1-10 greatest interference)? General activity- some limitation.  She is still looking after grandkids at baseline.  Enjoyment of life- "I keep going." What time of the day is the pain the worst: when pain meds wear off.   Sleep is described by patient as interrupted with pain.   Mobility/function Assistance device: no How many minutes can you walk: up to 1 mile, walking the dogs.   Able to climb steps:yes, with difficulty Driving: yes, doesn't take pain meds prior driving.   Bowel or bladder symptoms: yes Mood: "fine."  Physicians involved in care: she is going to f/u with hand clinic re: R thumb pain- visit pending for 01/03/21.  She is considering spinal cord stimulator evaluation.   Any changes since last visit? See above.    H/o B12 def, vit D def. recheck labs pending.  Meds, vitals, and allergies reviewed.   ROS: Per HPI unless specifically indicated in ROS section   GEN: nad, alert and oriented HEENT: ncat NECK: supple w/o LA CV: rrr PULM: ctab, no inc wob ABD: soft, +bs EXT: no edema SKIN: no acute rash Chronic joint changes on the arms/hands at  baseline

## 2020-12-12 ENCOUNTER — Other Ambulatory Visit: Payer: Self-pay | Admitting: Family Medicine

## 2020-12-12 DIAGNOSIS — E538 Deficiency of other specified B group vitamins: Secondary | ICD-10-CM

## 2020-12-12 DIAGNOSIS — E559 Vitamin D deficiency, unspecified: Secondary | ICD-10-CM

## 2020-12-12 MED ORDER — VITAMIN D (ERGOCALCIFEROL) 1.25 MG (50000 UNIT) PO CAPS: 50000.0000 [IU] | ORAL_CAPSULE | ORAL | 0 refills | Status: DC

## 2020-12-12 NOTE — Assessment & Plan Note (Signed)
See notes on follow-up labs. 

## 2020-12-12 NOTE — Assessment & Plan Note (Signed)
Due to chronic joint pain.  She is going to follow-up with a hand clinic regarding thumb pain.  She is considering spinal cord stimulator evaluation.  Continue hydrocodone as is for now.  She will update me as needed.  Not sedated and she does get some relief from the medication.  Routine cautions given to patient.  She agrees with plan.

## 2020-12-15 ENCOUNTER — Encounter: Payer: Self-pay | Admitting: Family Medicine

## 2020-12-17 ENCOUNTER — Other Ambulatory Visit: Payer: Self-pay | Admitting: Family Medicine

## 2020-12-17 MED ORDER — CYANOCOBALAMIN 1000 MCG/ML IJ SOLN: INTRAMUSCULAR | 2 refills | Status: DC

## 2020-12-17 MED ORDER — "BD INSULIN SYRINGE 25G X 1"" 1 ML MISC": 1 refills | Status: DC

## 2020-12-17 NOTE — Progress Notes (Signed)
Patient aware.

## 2020-12-27 DIAGNOSIS — Z885 Allergy status to narcotic agent status: Secondary | ICD-10-CM | POA: Diagnosis not present

## 2020-12-27 DIAGNOSIS — Z91040 Latex allergy status: Secondary | ICD-10-CM | POA: Diagnosis not present

## 2020-12-27 DIAGNOSIS — G90511 Complex regional pain syndrome I of right upper limb: Secondary | ICD-10-CM | POA: Diagnosis not present

## 2020-12-27 DIAGNOSIS — M960 Pseudarthrosis after fusion or arthrodesis: Secondary | ICD-10-CM | POA: Diagnosis not present

## 2020-12-27 DIAGNOSIS — G90512 Complex regional pain syndrome I of left upper limb: Secondary | ICD-10-CM | POA: Diagnosis not present

## 2020-12-27 DIAGNOSIS — G90513 Complex regional pain syndrome I of upper limb, bilateral: Secondary | ICD-10-CM | POA: Diagnosis not present

## 2020-12-27 DIAGNOSIS — M79641 Pain in right hand: Secondary | ICD-10-CM | POA: Diagnosis not present

## 2020-12-27 DIAGNOSIS — M1811 Unilateral primary osteoarthritis of first carpometacarpal joint, right hand: Secondary | ICD-10-CM | POA: Diagnosis not present

## 2020-12-27 DIAGNOSIS — Z888 Allergy status to other drugs, medicaments and biological substances status: Secondary | ICD-10-CM | POA: Diagnosis not present

## 2020-12-27 DIAGNOSIS — Z882 Allergy status to sulfonamides status: Secondary | ICD-10-CM | POA: Diagnosis not present

## 2020-12-27 DIAGNOSIS — M19041 Primary osteoarthritis, right hand: Secondary | ICD-10-CM | POA: Diagnosis not present

## 2021-02-09 ENCOUNTER — Other Ambulatory Visit: Payer: Self-pay | Admitting: Family Medicine

## 2021-02-09 DIAGNOSIS — G8929 Other chronic pain: Secondary | ICD-10-CM

## 2021-02-09 DIAGNOSIS — G9050 Complex regional pain syndrome I, unspecified: Secondary | ICD-10-CM

## 2021-02-09 NOTE — Telephone Encounter (Signed)
Sent. Thanks.   

## 2021-02-09 NOTE — Telephone Encounter (Signed)
Refill request for Tramadol 50 mg tablets  LOV - 12/09/20 Next OV - 03/11/21 Last refill - 07/15/20 #240/2

## 2021-02-21 ENCOUNTER — Other Ambulatory Visit: Payer: Self-pay | Admitting: Family Medicine

## 2021-02-21 DIAGNOSIS — R739 Hyperglycemia, unspecified: Secondary | ICD-10-CM

## 2021-02-21 DIAGNOSIS — E559 Vitamin D deficiency, unspecified: Secondary | ICD-10-CM

## 2021-02-21 DIAGNOSIS — E538 Deficiency of other specified B group vitamins: Secondary | ICD-10-CM

## 2021-02-21 DIAGNOSIS — D508 Other iron deficiency anemias: Secondary | ICD-10-CM

## 2021-03-03 ENCOUNTER — Other Ambulatory Visit: Payer: Self-pay

## 2021-03-03 DIAGNOSIS — G8929 Other chronic pain: Secondary | ICD-10-CM

## 2021-03-03 DIAGNOSIS — G9050 Complex regional pain syndrome I, unspecified: Secondary | ICD-10-CM

## 2021-03-03 MED ORDER — TIZANIDINE HCL 2 MG PO TABS: 2.0000 mg | ORAL_TABLET | Freq: Two times a day (BID) | ORAL | 3 refills | Status: DC

## 2021-03-04 ENCOUNTER — Other Ambulatory Visit: Payer: Medicare HMO

## 2021-03-05 ENCOUNTER — Ambulatory Visit: Payer: Medicare HMO

## 2021-03-11 ENCOUNTER — Encounter: Payer: Medicare HMO | Admitting: Family Medicine

## 2021-03-22 ENCOUNTER — Other Ambulatory Visit: Payer: Self-pay | Admitting: Family Medicine

## 2021-03-22 DIAGNOSIS — G8929 Other chronic pain: Secondary | ICD-10-CM

## 2021-03-22 DIAGNOSIS — G9050 Complex regional pain syndrome I, unspecified: Secondary | ICD-10-CM

## 2021-03-23 NOTE — Telephone Encounter (Signed)
Sent. Thanks.   

## 2021-03-23 NOTE — Telephone Encounter (Signed)
Refill request for hydrocodone-acetaminophen 5-325 mg tablets  LOV - 12/09/20 Next OV - 03/31/21 Last refill - 11/28/20 #180/0

## 2021-03-31 ENCOUNTER — Other Ambulatory Visit: Payer: Self-pay

## 2021-03-31 ENCOUNTER — Encounter: Payer: Self-pay | Admitting: Family Medicine

## 2021-03-31 ENCOUNTER — Ambulatory Visit (INDEPENDENT_AMBULATORY_CARE_PROVIDER_SITE_OTHER): Payer: Medicare HMO | Admitting: Family Medicine

## 2021-03-31 VITALS — BP 112/72 | HR 58 | Temp 97.9°F | Ht 64.0 in | Wt 170.0 lb

## 2021-03-31 DIAGNOSIS — Z7189 Other specified counseling: Secondary | ICD-10-CM

## 2021-03-31 DIAGNOSIS — Z0000 Encounter for general adult medical examination without abnormal findings: Secondary | ICD-10-CM

## 2021-03-31 DIAGNOSIS — Z1211 Encounter for screening for malignant neoplasm of colon: Secondary | ICD-10-CM

## 2021-03-31 DIAGNOSIS — G8929 Other chronic pain: Secondary | ICD-10-CM | POA: Diagnosis not present

## 2021-03-31 DIAGNOSIS — R609 Edema, unspecified: Secondary | ICD-10-CM

## 2021-03-31 DIAGNOSIS — E538 Deficiency of other specified B group vitamins: Secondary | ICD-10-CM | POA: Diagnosis not present

## 2021-03-31 DIAGNOSIS — D508 Other iron deficiency anemias: Secondary | ICD-10-CM

## 2021-03-31 DIAGNOSIS — R739 Hyperglycemia, unspecified: Secondary | ICD-10-CM | POA: Diagnosis not present

## 2021-03-31 DIAGNOSIS — E559 Vitamin D deficiency, unspecified: Secondary | ICD-10-CM

## 2021-03-31 NOTE — Progress Notes (Signed)
This visit occurred during the SARS-CoV-2 public health emergency.  Safety protocols were in place, including screening questions prior to the visit, additional usage of staff PPE, and extensive cleaning of exam room while observing appropriate contact time as indicated for disinfecting solutions.  I have personally reviewed the Medicare Annual Wellness questionnaire and have noted 1. The patient's medical and social history 2. Their use of alcohol, tobacco or illicit drugs 3. Their current medications and supplements 4. The patient's functional ability including ADL's, fall risks, home safety risks and hearing or visual             impairment. 5. Diet and physical activities 6. Evidence for depression or mood disorders  The patients weight, height, BMI have been recorded in the chart and visual acuity is per eye clinic.  I have made referrals, counseling and provided education to the patient based review of the above and I have provided the pt with a written personalized care plan for preventive services.  Provider list updated- see scanned forms.  Routine anticipatory guidance given to patient.  See health maintenance. The possibility exists that previously documented standard health maintenance information may have been brought forward from a previous encounter into this note.  If needed, that same information has been updated to reflect the current situation based on today's encounter.    Flu encouraged Shingles discussed with patient PNA discussed with patient Tetanus 2009 COVID-vaccine previously done Colonoscopy 2012.  D/w patient [id number] for colon cancer screening, including IFOB vs. colonoscopy.  Risks and benefits of both were discussed and patient voiced understanding.  Pt elects for: Cologuard Breast cancer screening 2022 Bone density test done 2021 Advance directive-husband designated patient were incapacitated. Cognitive function addressed- see scanned forms- and if abnormal  then additional documentation follows.   In addition to [facility], follow up visit for the below conditions:  Chronic pain. Still on baseline hydrocodone and tramadol w/o change.  She is considering spinal cord stimulator stimulator.  Not sedated. She is able to put up with back pain as is.  No ADE on med.    She had oral ulcers after starting vit d and B12 replacement.  She stopped both and got better.  It may be that she can tolerate B12 replacement w/o concurrent vit D replacement.    Still on HCTZ for edema.  No ADE on med.  Used with relief.  She sees inc in BLE edema if she skips med.    Was on topamax per outside clinic.  Off med currently as she ran out but she'll check with outside clinic.  I will defer.  She agrees.  History of hyperglycemia.  See notes on labs.  PMH and SH reviewed  Meds, vitals, and allergies reviewed.   ROS: Per HPI.  Unless specifically indicated otherwise in HPI, the patient denies:  General: fever. Eyes: acute vision changes ENT: sore throat Cardiovascular: chest pain Respiratory: SOB GI: vomiting GU: dysuria Musculoskeletal: acute back pain Derm: acute rash Neuro: acute motor dysfunction Psych: worsening mood Endocrine: polydipsia Heme: bleeding Allergy: hayfever  GEN: nad, alert and oriented HEENT: ncat NECK: supple w/o LA CV: rrr. PULM: ctab, no inc wob ABD: soft, +bs EXT: no edema SKIN: no acute rash  chronic joint changes noted in the extremities.

## 2021-03-31 NOTE — Patient Instructions (Signed)
I would get a flu shot each fall.   Go to the lab on the way out.   If you have mychart we'll likely use that to update you.    Take care.  Glad to see you. Check with your insurance to see if they will cover the shingles and tetanus shots.

## 2021-04-01 ENCOUNTER — Other Ambulatory Visit (INDEPENDENT_AMBULATORY_CARE_PROVIDER_SITE_OTHER): Payer: Medicare HMO

## 2021-04-01 DIAGNOSIS — D508 Other iron deficiency anemias: Secondary | ICD-10-CM | POA: Diagnosis not present

## 2021-04-01 DIAGNOSIS — E559 Vitamin D deficiency, unspecified: Secondary | ICD-10-CM | POA: Diagnosis not present

## 2021-04-01 DIAGNOSIS — E538 Deficiency of other specified B group vitamins: Secondary | ICD-10-CM | POA: Diagnosis not present

## 2021-04-01 DIAGNOSIS — R739 Hyperglycemia, unspecified: Secondary | ICD-10-CM | POA: Diagnosis not present

## 2021-04-01 LAB — CBC WITH DIFFERENTIAL/PLATELET
Basophils Absolute: 0.1 10*3/uL (ref 0.0–0.1)
Basophils Relative: 1.1 % (ref 0.0–3.0)
Eosinophils Absolute: 0.1 10*3/uL (ref 0.0–0.7)
Eosinophils Relative: 3 % (ref 0.0–5.0)
HCT: 38.6 % (ref 36.0–46.0)
Hemoglobin: 12.9 g/dL (ref 12.0–15.0)
Lymphocytes Relative: 25 % (ref 12.0–46.0)
Lymphs Abs: 1.2 10*3/uL (ref 0.7–4.0)
MCHC: 33.3 g/dL (ref 30.0–36.0)
MCV: 87.4 fl (ref 78.0–100.0)
Monocytes Absolute: 0.4 10*3/uL (ref 0.1–1.0)
Monocytes Relative: 7.4 % (ref 3.0–12.0)
Neutro Abs: 3 10*3/uL (ref 1.4–7.7)
Neutrophils Relative %: 63.5 % (ref 43.0–77.0)
Platelets: 270 10*3/uL (ref 150.0–400.0)
RBC: 4.42 Mil/uL (ref 3.87–5.11)
RDW: 14.6 % (ref 11.5–15.5)
WBC: 4.7 10*3/uL (ref 4.0–10.5)

## 2021-04-01 LAB — COMPREHENSIVE METABOLIC PANEL
ALT: 10 U/L (ref 0–35)
AST: 9 U/L (ref 0–37)
Albumin: 4.3 g/dL (ref 3.5–5.2)
Alkaline Phosphatase: 66 U/L (ref 39–117)
BUN: 13 mg/dL (ref 6–23)
CO2: 28 mEq/L (ref 19–32)
Calcium: 9.8 mg/dL (ref 8.4–10.5)
Chloride: 101 mEq/L (ref 96–112)
Creatinine, Ser: 0.74 mg/dL (ref 0.40–1.20)
GFR: 87.22 mL/min (ref >60.00)
Glucose, Bld: 87 mg/dL (ref 70–99)
Potassium: 3.7 mEq/L (ref 3.5–5.1)
Sodium: 140 mEq/L (ref 135–145)
Total Bilirubin: 0.4 mg/dL (ref 0.2–1.2)
Total Protein: 7 g/dL (ref 6.0–8.3)

## 2021-04-01 LAB — HEMOGLOBIN A1C: Hgb A1c MFr Bld: 6.2 % (ref 4.6–6.5)

## 2021-04-01 LAB — LIPID PANEL
Cholesterol: 179 mg/dL (ref 0–200)
HDL: 58.4 mg/dL (ref >39.00)
LDL Cholesterol: 105 mg/dL — ABNORMAL HIGH (ref 0–99)
NonHDL: 120.9
Total CHOL/HDL Ratio: 3
Triglycerides: 81 mg/dL (ref 0.0–149.0)
VLDL: 16.2 mg/dL (ref 0.0–40.0)

## 2021-04-01 LAB — IRON: Iron: 57 ug/dL (ref 42–145)

## 2021-04-01 LAB — VITAMIN D 25 HYDROXY (VIT D DEFICIENCY, FRACTURES): VITD: 31.11 ng/mL (ref 30.00–100.00)

## 2021-04-01 LAB — VITAMIN B12: Vitamin B-12: 247 pg/mL (ref 211–911)

## 2021-04-04 DIAGNOSIS — R739 Hyperglycemia, unspecified: Secondary | ICD-10-CM | POA: Insufficient documentation

## 2021-04-04 NOTE — Assessment & Plan Note (Signed)
See notes on labs. 

## 2021-04-04 NOTE — Assessment & Plan Note (Signed)
Flu encouraged Shingles discussed with patient PNA discussed with patient Tetanus 2009 COVID-vaccine previously done Colonoscopy 2012.  D/w patient [id number] for colon cancer screening, including IFOB vs. colonoscopy.  Risks and benefits of both were discussed and patient voiced understanding.  Pt elects for: Cologuard Breast cancer screening 2022 Bone density test done 2021 Advance directive-husband designated patient were incapacitated. Cognitive function addressed- see scanned forms- and if abnormal then additional documentation follows.

## 2021-04-04 NOTE — Assessment & Plan Note (Signed)
Continue hydrochlorothiazide use.  See notes on labs.

## 2021-04-04 NOTE — Assessment & Plan Note (Signed)
She had oral ulcers after starting vit d and B12 replacement.  She stopped both and got better.  It may be that she can tolerate B12 replacement w/o concurrent vit D replacement.   See notes on labs.

## 2021-04-04 NOTE — Assessment & Plan Note (Signed)
Advance directive-husband designated patient were incapacitated 

## 2021-04-04 NOTE — Assessment & Plan Note (Signed)
Still on baseline hydrocodone and tramadol w/o change.  She is considering spinal cord stimulator stimulator.  Not sedated. She is able to put up with back pain as is.  No ADE on med.  Would continue as is.  She agrees with plan.

## 2021-04-11 ENCOUNTER — Other Ambulatory Visit: Payer: Self-pay | Admitting: Family Medicine

## 2021-04-11 ENCOUNTER — Encounter: Payer: Self-pay | Admitting: Family Medicine

## 2021-04-11 DIAGNOSIS — G9050 Complex regional pain syndrome I, unspecified: Secondary | ICD-10-CM

## 2021-04-11 DIAGNOSIS — G8929 Other chronic pain: Secondary | ICD-10-CM

## 2021-04-11 MED ORDER — HYDROCODONE-ACETAMINOPHEN 5-325 MG PO TABS: ORAL_TABLET | ORAL | 0 refills | Status: DC

## 2021-04-11 MED ORDER — HYDROCODONE-ACETAMINOPHEN 5-325 MG PO TABS: 1.0000 | ORAL_TABLET | Freq: Four times a day (QID) | ORAL | 0 refills | Status: DC | PRN

## 2021-04-11 MED ORDER — TRAMADOL HCL 50 MG PO TABS: ORAL_TABLET | ORAL | 2 refills | Status: DC

## 2021-06-21 ENCOUNTER — Other Ambulatory Visit: Payer: Self-pay | Admitting: Family Medicine

## 2021-08-01 ENCOUNTER — Other Ambulatory Visit: Payer: Self-pay | Admitting: Family Medicine

## 2021-08-01 DIAGNOSIS — G9050 Complex regional pain syndrome I, unspecified: Secondary | ICD-10-CM

## 2021-08-01 DIAGNOSIS — G8929 Other chronic pain: Secondary | ICD-10-CM

## 2021-08-02 NOTE — Telephone Encounter (Signed)
Refill request for HYDROCODONE-APAP 5-325 MG TAB  LOV - 03/31/21 Next OV - not scheduled Last refill - 04/11/21 #180/0

## 2021-08-04 MED ORDER — HYDROCODONE-ACETAMINOPHEN 5-325 MG PO TABS: ORAL_TABLET | ORAL | 0 refills | Status: DC

## 2021-08-04 MED ORDER — HYDROCODONE-ACETAMINOPHEN 5-325 MG PO TABS: 1.0000 | ORAL_TABLET | Freq: Four times a day (QID) | ORAL | 0 refills | Status: DC | PRN

## 2021-08-04 NOTE — Telephone Encounter (Signed)
Sent. Thanks.   

## 2021-10-11 ENCOUNTER — Telehealth: Payer: Self-pay | Admitting: Family Medicine

## 2021-10-11 NOTE — Telephone Encounter (Signed)
Pt dropped off paperwork ? ? ? ?Type of forms received:annual disability ? ?Routed to: ?[place] ?Paperwork received by : terrill ? ? ?Individual made aware of 3-5 business day turn around (Y/N): ?y ?Form completed and patient made aware of charges(Y/N):y ? ? ?Faxed to :  ? ?Form location:  dr Para March box ?

## 2021-10-12 NOTE — Telephone Encounter (Signed)
I will work on the hardcopy.  Thanks. 

## 2021-10-19 ENCOUNTER — Other Ambulatory Visit: Payer: Self-pay | Admitting: Family Medicine

## 2021-10-19 DIAGNOSIS — G8929 Other chronic pain: Secondary | ICD-10-CM

## 2021-10-19 DIAGNOSIS — G9050 Complex regional pain syndrome I, unspecified: Secondary | ICD-10-CM

## 2021-10-19 NOTE — Telephone Encounter (Signed)
Refill request for  TRAMADOL HCL 50 MG TAB ? ?LOV - 03/31/21 ?Next OV - not scheduled ?Last refill - 04/11/21 #240/2 ? ?

## 2021-11-24 DIAGNOSIS — S39011D Strain of muscle, fascia and tendon of abdomen, subsequent encounter: Secondary | ICD-10-CM | POA: Diagnosis not present

## 2021-12-02 ENCOUNTER — Other Ambulatory Visit: Payer: Self-pay | Admitting: Family Medicine

## 2021-12-02 DIAGNOSIS — G8929 Other chronic pain: Secondary | ICD-10-CM

## 2021-12-02 DIAGNOSIS — G9050 Complex regional pain syndrome I, unspecified: Secondary | ICD-10-CM

## 2021-12-02 NOTE — Telephone Encounter (Signed)
Refill request for HYDROCODONE-APAP 5-325 MG TAB  LOV - 03/31/2021 Next OV - not scheduled Last refill - 08/04/21 #180/0 x 3

## 2021-12-03 MED ORDER — HYDROCODONE-ACETAMINOPHEN 5-325 MG PO TABS: ORAL_TABLET | ORAL | 0 refills | Status: DC

## 2021-12-03 NOTE — Telephone Encounter (Signed)
Sent. Thanks.   

## 2022-01-02 DIAGNOSIS — S63211D Subluxation of metacarpophalangeal joint of left index finger, subsequent encounter: Secondary | ICD-10-CM | POA: Diagnosis not present

## 2022-01-02 DIAGNOSIS — S63213D Subluxation of metacarpophalangeal joint of left middle finger, subsequent encounter: Secondary | ICD-10-CM | POA: Diagnosis not present

## 2022-01-02 DIAGNOSIS — M19042 Primary osteoarthritis, left hand: Secondary | ICD-10-CM | POA: Diagnosis not present

## 2022-01-02 DIAGNOSIS — M1811 Unilateral primary osteoarthritis of first carpometacarpal joint, right hand: Secondary | ICD-10-CM | POA: Diagnosis not present

## 2022-01-02 DIAGNOSIS — G90513 Complex regional pain syndrome I of upper limb, bilateral: Secondary | ICD-10-CM | POA: Diagnosis not present

## 2022-01-02 DIAGNOSIS — G90511 Complex regional pain syndrome I of right upper limb: Secondary | ICD-10-CM | POA: Diagnosis not present

## 2022-01-02 DIAGNOSIS — Q688 Other specified congenital musculoskeletal deformities: Secondary | ICD-10-CM | POA: Diagnosis not present

## 2022-01-02 DIAGNOSIS — M1812 Unilateral primary osteoarthritis of first carpometacarpal joint, left hand: Secondary | ICD-10-CM | POA: Diagnosis not present

## 2022-01-02 DIAGNOSIS — M19041 Primary osteoarthritis, right hand: Secondary | ICD-10-CM | POA: Diagnosis not present

## 2022-01-02 DIAGNOSIS — M20002 Unspecified deformity of left finger(s): Secondary | ICD-10-CM | POA: Diagnosis not present

## 2022-01-02 DIAGNOSIS — M180 Bilateral primary osteoarthritis of first carpometacarpal joints: Secondary | ICD-10-CM | POA: Diagnosis not present

## 2022-01-02 DIAGNOSIS — S53005D Unspecified dislocation of left radial head, subsequent encounter: Secondary | ICD-10-CM | POA: Diagnosis not present

## 2022-01-02 DIAGNOSIS — M960 Pseudarthrosis after fusion or arthrodesis: Secondary | ICD-10-CM | POA: Diagnosis not present

## 2022-01-02 DIAGNOSIS — Z981 Arthrodesis status: Secondary | ICD-10-CM | POA: Diagnosis not present

## 2022-01-02 DIAGNOSIS — R9389 Abnormal findings on diagnostic imaging of other specified body structures: Secondary | ICD-10-CM | POA: Diagnosis not present

## 2022-01-02 DIAGNOSIS — G5632 Lesion of radial nerve, left upper limb: Secondary | ICD-10-CM | POA: Diagnosis not present

## 2022-01-02 DIAGNOSIS — G90512 Complex regional pain syndrome I of left upper limb: Secondary | ICD-10-CM | POA: Diagnosis not present

## 2022-01-04 ENCOUNTER — Other Ambulatory Visit: Payer: Self-pay | Admitting: Family Medicine

## 2022-01-04 DIAGNOSIS — G9050 Complex regional pain syndrome I, unspecified: Secondary | ICD-10-CM

## 2022-01-04 DIAGNOSIS — G8929 Other chronic pain: Secondary | ICD-10-CM

## 2022-02-08 DIAGNOSIS — R29898 Other symptoms and signs involving the musculoskeletal system: Secondary | ICD-10-CM | POA: Diagnosis not present

## 2022-02-08 DIAGNOSIS — M19042 Primary osteoarthritis, left hand: Secondary | ICD-10-CM | POA: Diagnosis not present

## 2022-02-08 DIAGNOSIS — Q688 Other specified congenital musculoskeletal deformities: Secondary | ICD-10-CM | POA: Diagnosis not present

## 2022-02-08 DIAGNOSIS — M960 Pseudarthrosis after fusion or arthrodesis: Secondary | ICD-10-CM | POA: Diagnosis not present

## 2022-02-08 DIAGNOSIS — Z87768 Personal history of other specified (corrected) congenital malformations of integument, limbs and musculoskeletal system: Secondary | ICD-10-CM | POA: Diagnosis not present

## 2022-02-08 DIAGNOSIS — M180 Bilateral primary osteoarthritis of first carpometacarpal joints: Secondary | ICD-10-CM | POA: Diagnosis not present

## 2022-02-27 DIAGNOSIS — G5632 Lesion of radial nerve, left upper limb: Secondary | ICD-10-CM | POA: Diagnosis not present

## 2022-02-27 DIAGNOSIS — Z981 Arthrodesis status: Secondary | ICD-10-CM | POA: Diagnosis not present

## 2022-02-27 DIAGNOSIS — Z9889 Other specified postprocedural states: Secondary | ICD-10-CM | POA: Diagnosis not present

## 2022-02-27 DIAGNOSIS — G5601 Carpal tunnel syndrome, right upper limb: Secondary | ICD-10-CM | POA: Diagnosis not present

## 2022-02-27 DIAGNOSIS — M960 Pseudarthrosis after fusion or arthrodesis: Secondary | ICD-10-CM | POA: Diagnosis not present

## 2022-02-27 DIAGNOSIS — M1811 Unilateral primary osteoarthritis of first carpometacarpal joint, right hand: Secondary | ICD-10-CM | POA: Diagnosis not present

## 2022-02-28 ENCOUNTER — Other Ambulatory Visit: Payer: Self-pay | Admitting: Family Medicine

## 2022-02-28 DIAGNOSIS — G8929 Other chronic pain: Secondary | ICD-10-CM

## 2022-02-28 DIAGNOSIS — G9050 Complex regional pain syndrome I, unspecified: Secondary | ICD-10-CM

## 2022-02-28 NOTE — Telephone Encounter (Signed)
Refill request for HYDROCODONE-APAP 5-325 MG TAB  LOV - 03/31/21 Next OV - not scheduled Last refill - 12/03/21 #180/0 x 3

## 2022-03-01 MED ORDER — HYDROCODONE-ACETAMINOPHEN 5-325 MG PO TABS: ORAL_TABLET | ORAL | 0 refills | Status: DC

## 2022-03-01 MED ORDER — HYDROCODONE-ACETAMINOPHEN 5-325 MG PO TABS: 1.0000 | ORAL_TABLET | Freq: Four times a day (QID) | ORAL | 0 refills | Status: DC | PRN

## 2022-03-01 NOTE — Telephone Encounter (Signed)
Patient scheduled.

## 2022-03-01 NOTE — Telephone Encounter (Signed)
Sent.  Needs yearly visit scheduled.  Thanks.

## 2022-03-19 ENCOUNTER — Other Ambulatory Visit: Payer: Self-pay | Admitting: Family Medicine

## 2022-03-19 DIAGNOSIS — E559 Vitamin D deficiency, unspecified: Secondary | ICD-10-CM

## 2022-03-19 DIAGNOSIS — D508 Other iron deficiency anemias: Secondary | ICD-10-CM

## 2022-03-19 DIAGNOSIS — E538 Deficiency of other specified B group vitamins: Secondary | ICD-10-CM

## 2022-03-19 DIAGNOSIS — R739 Hyperglycemia, unspecified: Secondary | ICD-10-CM

## 2022-03-27 ENCOUNTER — Other Ambulatory Visit (INDEPENDENT_AMBULATORY_CARE_PROVIDER_SITE_OTHER): Payer: Medicare HMO

## 2022-03-27 DIAGNOSIS — R739 Hyperglycemia, unspecified: Secondary | ICD-10-CM | POA: Diagnosis not present

## 2022-03-27 DIAGNOSIS — D508 Other iron deficiency anemias: Secondary | ICD-10-CM | POA: Diagnosis not present

## 2022-03-27 DIAGNOSIS — E559 Vitamin D deficiency, unspecified: Secondary | ICD-10-CM

## 2022-03-27 DIAGNOSIS — E538 Deficiency of other specified B group vitamins: Secondary | ICD-10-CM | POA: Diagnosis not present

## 2022-03-27 LAB — COMPREHENSIVE METABOLIC PANEL
ALT: 11 U/L (ref 0–35)
AST: 10 U/L (ref 0–37)
Albumin: 4.1 g/dL (ref 3.5–5.2)
Alkaline Phosphatase: 66 U/L (ref 39–117)
BUN: 16 mg/dL (ref 6–23)
CO2: 28 mEq/L (ref 19–32)
Calcium: 9.5 mg/dL (ref 8.4–10.5)
Chloride: 100 mEq/L (ref 96–112)
Creatinine, Ser: 0.73 mg/dL (ref 0.40–1.20)
GFR: 88.04 mL/min (ref >60.00)
Glucose, Bld: 95 mg/dL (ref 70–99)
Potassium: 3.2 mEq/L — ABNORMAL LOW (ref 3.5–5.1)
Sodium: 137 mEq/L (ref 135–145)
Total Bilirubin: 0.4 mg/dL (ref 0.2–1.2)
Total Protein: 7.7 g/dL (ref 6.0–8.3)

## 2022-03-27 LAB — CBC WITH DIFFERENTIAL/PLATELET
Basophils Absolute: 0.1 10*3/uL (ref 0.0–0.1)
Basophils Relative: 1.1 % (ref 0.0–3.0)
Eosinophils Absolute: 0.2 10*3/uL (ref 0.0–0.7)
Eosinophils Relative: 3.2 % (ref 0.0–5.0)
HCT: 37.4 % (ref 36.0–46.0)
Hemoglobin: 12.4 g/dL (ref 12.0–15.0)
Lymphocytes Relative: 39.3 % (ref 12.0–46.0)
Lymphs Abs: 2 10*3/uL (ref 0.7–4.0)
MCHC: 33.1 g/dL (ref 30.0–36.0)
MCV: 86.7 fl (ref 78.0–100.0)
Monocytes Absolute: 0.5 10*3/uL (ref 0.1–1.0)
Monocytes Relative: 8.9 % (ref 3.0–12.0)
Neutro Abs: 2.5 10*3/uL (ref 1.4–7.7)
Neutrophils Relative %: 47.5 % (ref 43.0–77.0)
Platelets: 261 10*3/uL (ref 150.0–400.0)
RBC: 4.32 Mil/uL (ref 3.87–5.11)
RDW: 15 % (ref 11.5–15.5)
WBC: 5.2 10*3/uL (ref 4.0–10.5)

## 2022-03-27 LAB — LIPID PANEL
Cholesterol: 192 mg/dL (ref 0–200)
HDL: 56.1 mg/dL (ref >39.00)
LDL Cholesterol: 117 mg/dL — ABNORMAL HIGH (ref 0–99)
NonHDL: 136.29
Total CHOL/HDL Ratio: 3
Triglycerides: 98 mg/dL (ref 0.0–149.0)
VLDL: 19.6 mg/dL (ref 0.0–40.0)

## 2022-03-27 LAB — VITAMIN D 25 HYDROXY (VIT D DEFICIENCY, FRACTURES): VITD: 29.89 ng/mL — ABNORMAL LOW (ref 30.00–100.00)

## 2022-03-27 LAB — HEMOGLOBIN A1C: Hgb A1c MFr Bld: 6.5 % (ref 4.6–6.5)

## 2022-03-27 LAB — VITAMIN B12: Vitamin B-12: 194 pg/mL — ABNORMAL LOW (ref 211–911)

## 2022-04-03 ENCOUNTER — Ambulatory Visit (INDEPENDENT_AMBULATORY_CARE_PROVIDER_SITE_OTHER): Payer: Medicare HMO | Admitting: Family Medicine

## 2022-04-03 ENCOUNTER — Telehealth: Payer: Self-pay | Admitting: Family Medicine

## 2022-04-03 ENCOUNTER — Encounter: Payer: Self-pay | Admitting: Family Medicine

## 2022-04-03 VITALS — BP 110/66 | HR 58 | Temp 97.6°F | Ht 64.0 in | Wt 176.0 lb

## 2022-04-03 DIAGNOSIS — E538 Deficiency of other specified B group vitamins: Secondary | ICD-10-CM | POA: Diagnosis not present

## 2022-04-03 DIAGNOSIS — Z7189 Other specified counseling: Secondary | ICD-10-CM

## 2022-04-03 DIAGNOSIS — R609 Edema, unspecified: Secondary | ICD-10-CM

## 2022-04-03 DIAGNOSIS — Z0000 Encounter for general adult medical examination without abnormal findings: Secondary | ICD-10-CM

## 2022-04-03 DIAGNOSIS — G8929 Other chronic pain: Secondary | ICD-10-CM

## 2022-04-03 DIAGNOSIS — E119 Type 2 diabetes mellitus without complications: Secondary | ICD-10-CM | POA: Insufficient documentation

## 2022-04-03 DIAGNOSIS — E559 Vitamin D deficiency, unspecified: Secondary | ICD-10-CM

## 2022-04-03 DIAGNOSIS — G9050 Complex regional pain syndrome I, unspecified: Secondary | ICD-10-CM

## 2022-04-03 DIAGNOSIS — Z1211 Encounter for screening for malignant neoplasm of colon: Secondary | ICD-10-CM

## 2022-04-03 MED ORDER — VITAMIN B-12 1000 MCG PO TABS: 1000.0000 ug | ORAL_TABLET | Freq: Every day | ORAL | Status: DC

## 2022-04-03 MED ORDER — HYDROCHLOROTHIAZIDE 25 MG PO TABS: 50.0000 mg | ORAL_TABLET | Freq: Every day | ORAL | 3 refills | Status: DC

## 2022-04-03 NOTE — Progress Notes (Unsigned)
I have personally reviewed the Medicare Annual Wellness questionnaire and have noted 1. The patient's medical and social history 2. Their use of alcohol, tobacco or illicit drugs 3. Their current medications and supplements 4. The patient's functional ability including ADL's, fall risks, home safety risks and hearing or visual             impairment. 5. Diet and physical activities 6. Evidence for depression or mood disorders  The patients weight, height, BMI have been recorded in the chart and visual acuity is per eye clinic.  I have made referrals, counseling and provided education to the patient based review of the above and I have provided the pt with a written personalized care plan for preventive services.  Provider list updated- see scanned forms.  Routine anticipatory guidance given to patient.  See health maintenance. The possibility exists that previously documented standard health maintenance information may have been brought forward from a previous encounter into this note.  If needed, that same information has been updated to reflect the current situation based on today's encounter.    Flu Shingles PNA Tetanus Colon  Breast cancer screening Advance directive Cognitive function addressed- see scanned forms- and if abnormal then additional documentation follows.   In addition to [facility] Wellness, follow up visit for the below conditions:  Diabetes:  No meds. D/w pt about labs and dx and path/phys and recheck.  Low sugar diet given to patient.    Low vitamin D, d/w pt.  Would only make 1 change at a time, ie B12 replacement.    B12 low.  D/w pt about retrial of oral B12 to see if she can tolerate.    She is planning to have R forearm and palm surgery.  She is going to have hardware removal in the R thumb.  She is on pain meds at baseline.  Not sedated.    Still on HCTZ for edema.  It helps.  Low K noted, d/w pt about inc replacement.  High K diet given to patient.     Tender area L posterior lateral neck, noted for a few weeks.  Small, ~1cm.    PMH and SH reviewed  Meds, vitals, and allergies reviewed.   ROS: Per HPI.  Unless specifically indicated otherwise in HPI, the patient denies:  General: fever. Eyes: acute vision changes ENT: sore throat Cardiovascular: chest pain Respiratory: SOB GI: vomiting GU: dysuria Musculoskeletal: acute back pain Derm: acute rash Neuro: acute motor dysfunction Psych: worsening mood Endocrine: polydipsia Heme: bleeding Allergy: hayfever  GEN: nad, alert and oriented HEENT: ncat NECK: supple w/o LA CV: rrr. PULM: ctab, no inc wob ABD: soft, +bs EXT: no edema SKIN: no acute rash

## 2022-04-03 NOTE — Telephone Encounter (Signed)
Patient advised to repeat and send sample.

## 2022-04-03 NOTE — Telephone Encounter (Signed)
Please update patient.  I don't see cologuard result in EMR and cologuard company doesn't have results on the test either.  It appears the sample wasn't delivered.    Would repeat. I ordered it.  If she doesn't get a result note from [place] within 3 weeks of mailing the sample then please let us know.  Thanks.

## 2022-04-03 NOTE — Patient Instructions (Addendum)
B12 by mouth if tolerated.  If not, let me know.   Use the eat right and higher potassium diet.  Recheck labs prior to a visit in about 3-4 months.   Take care.  Glad to see you. Let me check on the cologuard results from last year.

## 2022-04-04 ENCOUNTER — Telehealth: Payer: Self-pay | Admitting: Family Medicine

## 2022-04-04 NOTE — Telephone Encounter (Signed)
Patient declined AWV did not think it was necessary

## 2022-04-05 NOTE — Assessment & Plan Note (Signed)
She is planning to have R forearm and palm surgery.  She is going to have hardware removal in the R thumb.  She is on pain meds at baseline.  Not sedated.  Would continue Hydrocodone and tramadol as is.  She is able to get some relief of pain with those medications without adverse effect.

## 2022-04-05 NOTE — Assessment & Plan Note (Signed)
Advance directive- husband designated if patient were incapacitated.  

## 2022-04-05 NOTE — Assessment & Plan Note (Signed)
Still on HCTZ for edema.  It helps.  Low K noted, d/w pt about inc replacement.  High K diet given to patient.

## 2022-04-05 NOTE — Assessment & Plan Note (Signed)
  B12 low.  D/w pt about retrial of oral B12 to see if she can tolerate.

## 2022-04-05 NOTE — Assessment & Plan Note (Signed)
Low vitamin D, d/w pt.  Would only make 1 change at a time, ie B12 replacement.

## 2022-04-05 NOTE — Assessment & Plan Note (Signed)
Flu encouraged Shingles discussed PNA discussed Tetanus 2009, discussed COVID-vaccine previously done Discussed that Cologuard results were never received here.  Discussed repeating.  Ordered. Breast cancer screening discussed with patient.  She will call. Bone density test deferred at this point. Advance directive-husband designated if patient were incapacitated. Cognitive function addressed- see scanned forms- and if abnormal then additional documentation follows.

## 2022-04-05 NOTE — Assessment & Plan Note (Signed)
No meds. D/w pt about labs and dx and path/phys and recheck.  Low sugar diet given to patient.

## 2022-04-06 ENCOUNTER — Other Ambulatory Visit: Payer: Self-pay | Admitting: Family Medicine

## 2022-04-06 DIAGNOSIS — G9050 Complex regional pain syndrome I, unspecified: Secondary | ICD-10-CM

## 2022-04-06 DIAGNOSIS — G8929 Other chronic pain: Secondary | ICD-10-CM

## 2022-04-06 NOTE — Telephone Encounter (Signed)
Refill request for TRAMADOL HCL 50 MG TAB  LOV - 04/03/22 Next OV - 07/07/22 Last refill - 10/19/21 #240/2

## 2022-04-27 DIAGNOSIS — J45909 Unspecified asthma, uncomplicated: Secondary | ICD-10-CM | POA: Diagnosis not present

## 2022-04-27 DIAGNOSIS — K219 Gastro-esophageal reflux disease without esophagitis: Secondary | ICD-10-CM | POA: Diagnosis not present

## 2022-05-02 DIAGNOSIS — G8929 Other chronic pain: Secondary | ICD-10-CM | POA: Diagnosis not present

## 2022-05-02 DIAGNOSIS — G90513 Complex regional pain syndrome I of upper limb, bilateral: Secondary | ICD-10-CM | POA: Diagnosis not present

## 2022-05-02 DIAGNOSIS — G5601 Carpal tunnel syndrome, right upper limb: Secondary | ICD-10-CM | POA: Diagnosis not present

## 2022-05-02 DIAGNOSIS — G8918 Other acute postprocedural pain: Secondary | ICD-10-CM | POA: Diagnosis not present

## 2022-05-02 DIAGNOSIS — M79641 Pain in right hand: Secondary | ICD-10-CM | POA: Diagnosis not present

## 2022-05-02 DIAGNOSIS — Z472 Encounter for removal of internal fixation device: Secondary | ICD-10-CM | POA: Diagnosis not present

## 2022-05-02 DIAGNOSIS — J45909 Unspecified asthma, uncomplicated: Secondary | ICD-10-CM | POA: Diagnosis not present

## 2022-05-02 DIAGNOSIS — R600 Localized edema: Secondary | ICD-10-CM | POA: Diagnosis not present

## 2022-05-02 DIAGNOSIS — K219 Gastro-esophageal reflux disease without esophagitis: Secondary | ICD-10-CM | POA: Diagnosis not present

## 2022-05-02 DIAGNOSIS — G90511 Complex regional pain syndrome I of right upper limb: Secondary | ICD-10-CM | POA: Diagnosis not present

## 2022-05-02 DIAGNOSIS — G90523 Complex regional pain syndrome I of lower limb, bilateral: Secondary | ICD-10-CM | POA: Diagnosis not present

## 2022-05-02 DIAGNOSIS — G5611 Other lesions of median nerve, right upper limb: Secondary | ICD-10-CM | POA: Diagnosis not present

## 2022-05-02 DIAGNOSIS — M9689 Other intraoperative and postprocedural complications and disorders of the musculoskeletal system: Secondary | ICD-10-CM | POA: Diagnosis not present

## 2022-05-02 DIAGNOSIS — M1811 Unilateral primary osteoarthritis of first carpometacarpal joint, right hand: Secondary | ICD-10-CM | POA: Diagnosis not present

## 2022-05-03 DIAGNOSIS — G90523 Complex regional pain syndrome I of lower limb, bilateral: Secondary | ICD-10-CM | POA: Diagnosis not present

## 2022-05-03 DIAGNOSIS — J45909 Unspecified asthma, uncomplicated: Secondary | ICD-10-CM | POA: Diagnosis not present

## 2022-05-03 DIAGNOSIS — K219 Gastro-esophageal reflux disease without esophagitis: Secondary | ICD-10-CM | POA: Diagnosis not present

## 2022-05-03 DIAGNOSIS — G90513 Complex regional pain syndrome I of upper limb, bilateral: Secondary | ICD-10-CM | POA: Diagnosis not present

## 2022-05-03 DIAGNOSIS — R600 Localized edema: Secondary | ICD-10-CM | POA: Diagnosis not present

## 2022-05-03 DIAGNOSIS — G5611 Other lesions of median nerve, right upper limb: Secondary | ICD-10-CM | POA: Diagnosis not present

## 2022-05-03 DIAGNOSIS — M1811 Unilateral primary osteoarthritis of first carpometacarpal joint, right hand: Secondary | ICD-10-CM | POA: Diagnosis not present

## 2022-05-03 DIAGNOSIS — G8918 Other acute postprocedural pain: Secondary | ICD-10-CM | POA: Diagnosis not present

## 2022-05-03 DIAGNOSIS — G5601 Carpal tunnel syndrome, right upper limb: Secondary | ICD-10-CM | POA: Diagnosis not present

## 2022-05-03 DIAGNOSIS — M25531 Pain in right wrist: Secondary | ICD-10-CM | POA: Diagnosis not present

## 2022-05-15 DIAGNOSIS — M1811 Unilateral primary osteoarthritis of first carpometacarpal joint, right hand: Secondary | ICD-10-CM | POA: Diagnosis not present

## 2022-05-15 DIAGNOSIS — Z981 Arthrodesis status: Secondary | ICD-10-CM | POA: Diagnosis not present

## 2022-05-15 DIAGNOSIS — Z9889 Other specified postprocedural states: Secondary | ICD-10-CM | POA: Diagnosis not present

## 2022-05-15 DIAGNOSIS — Z4789 Encounter for other orthopedic aftercare: Secondary | ICD-10-CM | POA: Diagnosis not present

## 2022-06-02 ENCOUNTER — Other Ambulatory Visit: Payer: Self-pay | Admitting: Family Medicine

## 2022-06-02 DIAGNOSIS — G8929 Other chronic pain: Secondary | ICD-10-CM

## 2022-06-02 DIAGNOSIS — G9050 Complex regional pain syndrome I, unspecified: Secondary | ICD-10-CM

## 2022-06-02 NOTE — Telephone Encounter (Signed)
Refill request for HYDROCODONE-APAP 5-325 MG TAB   LOV - 04/03/22 Next OV - 07/07/2022 Last refill - 03/01/22 #180/0 x 3

## 2022-06-04 MED ORDER — HYDROCODONE-ACETAMINOPHEN 5-325 MG PO TABS: 1.0000 | ORAL_TABLET | Freq: Four times a day (QID) | ORAL | 0 refills | Status: DC | PRN

## 2022-06-04 MED ORDER — HYDROCODONE-ACETAMINOPHEN 5-325 MG PO TABS: ORAL_TABLET | ORAL | 0 refills | Status: DC

## 2022-06-16 DIAGNOSIS — Z981 Arthrodesis status: Secondary | ICD-10-CM | POA: Diagnosis not present

## 2022-06-16 DIAGNOSIS — M79644 Pain in right finger(s): Secondary | ICD-10-CM | POA: Diagnosis not present

## 2022-06-16 DIAGNOSIS — M19011 Primary osteoarthritis, right shoulder: Secondary | ICD-10-CM | POA: Diagnosis not present

## 2022-06-16 DIAGNOSIS — M25511 Pain in right shoulder: Secondary | ICD-10-CM | POA: Diagnosis not present

## 2022-06-16 DIAGNOSIS — Z9889 Other specified postprocedural states: Secondary | ICD-10-CM | POA: Diagnosis not present

## 2022-07-03 ENCOUNTER — Other Ambulatory Visit (INDEPENDENT_AMBULATORY_CARE_PROVIDER_SITE_OTHER): Payer: Medicare HMO

## 2022-07-03 DIAGNOSIS — E119 Type 2 diabetes mellitus without complications: Secondary | ICD-10-CM

## 2022-07-03 DIAGNOSIS — E559 Vitamin D deficiency, unspecified: Secondary | ICD-10-CM | POA: Diagnosis not present

## 2022-07-03 DIAGNOSIS — E538 Deficiency of other specified B group vitamins: Secondary | ICD-10-CM

## 2022-07-03 LAB — VITAMIN B12: Vitamin B-12: 295 pg/mL (ref 211–911)

## 2022-07-03 LAB — VITAMIN D 25 HYDROXY (VIT D DEFICIENCY, FRACTURES): VITD: 19.27 ng/mL — ABNORMAL LOW (ref 30.00–100.00)

## 2022-07-03 LAB — HEMOGLOBIN A1C: Hgb A1c MFr Bld: 6.5 % (ref 4.6–6.5)

## 2022-07-06 ENCOUNTER — Ambulatory Visit (INDEPENDENT_AMBULATORY_CARE_PROVIDER_SITE_OTHER): Payer: Medicare HMO | Admitting: Family Medicine

## 2022-07-06 ENCOUNTER — Encounter: Payer: Self-pay | Admitting: Family Medicine

## 2022-07-06 VITALS — BP 110/78 | HR 68 | Temp 97.3°F | Ht 64.0 in | Wt 172.0 lb

## 2022-07-06 DIAGNOSIS — G9050 Complex regional pain syndrome I, unspecified: Secondary | ICD-10-CM

## 2022-07-06 DIAGNOSIS — E559 Vitamin D deficiency, unspecified: Secondary | ICD-10-CM

## 2022-07-06 DIAGNOSIS — E538 Deficiency of other specified B group vitamins: Secondary | ICD-10-CM

## 2022-07-06 DIAGNOSIS — E119 Type 2 diabetes mellitus without complications: Secondary | ICD-10-CM | POA: Diagnosis not present

## 2022-07-06 DIAGNOSIS — G8929 Other chronic pain: Secondary | ICD-10-CM | POA: Diagnosis not present

## 2022-07-06 MED ORDER — HYDROCODONE-ACETAMINOPHEN 5-325 MG PO TABS: ORAL_TABLET | ORAL | 0 refills | Status: DC

## 2022-07-06 MED ORDER — VITAMIN D (ERGOCALCIFEROL) 1.25 MG (50000 UNIT) PO CAPS: 50000.0000 [IU] | ORAL_CAPSULE | ORAL | 0 refills | Status: DC

## 2022-07-06 MED ORDER — TRAMADOL HCL 50 MG PO TABS: 50.0000 mg | ORAL_TABLET | Freq: Four times a day (QID) | ORAL | 2 refills | Status: DC | PRN

## 2022-07-06 NOTE — Patient Instructions (Addendum)
See if you can tolerate weekly vit D.  Keep taking B12 as is.  Recheck A1c and other labs in about 3 months.  Let me know if you have trouble getting your refills.  Take care.  Glad to see you.

## 2022-07-06 NOTE — Progress Notes (Signed)
Diabetes:  No meds.   Hypoglycemic episodes: no  Hyperglycemic episodes: no  Feet problems: not from DM but h/o neuropathy at baseline.   Blood Sugars averaging: not checked.   eye exam within last year: d/w pt.   Diet d/w pt.  She cut back on soda and increased water.  Exercise affected by pain/joint fusions.    B12 normal now.  Tolerated w/o oral ulcers.    Vit D low.  She is on daily OTC vit D at baseline. We talked about retrial of weekly vit D.  Rx sent.    Labs d/w pt at [place].  She is still at baseline level of pain.  She had R wrist surgery. In brace today.  6-7/10 pain at baseline, d/w pt.  Not sedated.  Compliant.  No constipation.  Never pain free but current meds help some.    Meds, vitals, and allergies reviewed.   ROS: Per HPI unless specifically indicated in ROS section   GEN: nad, alert and oriented HEENT: ncat NECK: supple w/o LA CV: rrr. PULM: ctab, no inc wob ABD: soft, +bs EXT: no edema SKIN: Well-perfused. Right wrist in brace with chronic joint changes noted on the hands.

## 2022-07-07 ENCOUNTER — Ambulatory Visit: Payer: Medicare HMO | Admitting: Family Medicine

## 2022-07-08 NOTE — Assessment & Plan Note (Signed)
B12 normal now.  Tolerated w/o oral ulcers.   Continue as is.

## 2022-07-08 NOTE — Assessment & Plan Note (Signed)
She is still at baseline level of pain.  She had R wrist surgery. In brace today.  6-7/10 pain at baseline, d/w pt.  Not sedated.  Compliant.  No constipation.  Never pain free but current meds help some.  Continue tramadol and hydrocodone as is.

## 2022-07-08 NOTE — Assessment & Plan Note (Addendum)
Diet d/w pt.  She cut back on soda and increased water.  Exercise affected by pain/joint fusions.  No change in medication.  Recheck periodically. Recheck A1c and other labs in about 3 months.

## 2022-07-08 NOTE — Assessment & Plan Note (Signed)
Vit D low.  She is on daily OTC vit D at baseline. We talked about retrial of weekly vit D.  Rx sent.

## 2022-08-14 DIAGNOSIS — G90511 Complex regional pain syndrome I of right upper limb: Secondary | ICD-10-CM | POA: Diagnosis not present

## 2022-08-14 DIAGNOSIS — T8489XA Other specified complication of internal orthopedic prosthetic devices, implants and grafts, initial encounter: Secondary | ICD-10-CM | POA: Diagnosis not present

## 2022-08-14 DIAGNOSIS — G90513 Complex regional pain syndrome I of upper limb, bilateral: Secondary | ICD-10-CM | POA: Diagnosis not present

## 2022-08-14 DIAGNOSIS — Z9889 Other specified postprocedural states: Secondary | ICD-10-CM | POA: Diagnosis not present

## 2022-08-14 DIAGNOSIS — Z981 Arthrodesis status: Secondary | ICD-10-CM | POA: Diagnosis not present

## 2022-08-14 DIAGNOSIS — G5611 Other lesions of median nerve, right upper limb: Secondary | ICD-10-CM | POA: Diagnosis not present

## 2022-08-14 DIAGNOSIS — G90512 Complex regional pain syndrome I of left upper limb: Secondary | ICD-10-CM | POA: Diagnosis not present

## 2022-08-14 DIAGNOSIS — G5621 Lesion of ulnar nerve, right upper limb: Secondary | ICD-10-CM | POA: Diagnosis not present

## 2022-08-14 DIAGNOSIS — G5633 Lesion of radial nerve, bilateral upper limbs: Secondary | ICD-10-CM | POA: Diagnosis not present

## 2022-08-14 DIAGNOSIS — G5680 Other specified mononeuropathies of unspecified upper limb: Secondary | ICD-10-CM | POA: Diagnosis not present

## 2022-08-14 DIAGNOSIS — M24641 Ankylosis, right hand: Secondary | ICD-10-CM | POA: Diagnosis not present

## 2022-08-14 DIAGNOSIS — G5620 Lesion of ulnar nerve, unspecified upper limb: Secondary | ICD-10-CM | POA: Diagnosis not present

## 2022-08-17 DIAGNOSIS — S93334A Other dislocation of right foot, initial encounter: Secondary | ICD-10-CM | POA: Diagnosis not present

## 2022-08-17 DIAGNOSIS — M159 Polyosteoarthritis, unspecified: Secondary | ICD-10-CM | POA: Diagnosis not present

## 2022-08-17 DIAGNOSIS — M205X1 Other deformities of toe(s) (acquired), right foot: Secondary | ICD-10-CM | POA: Diagnosis not present

## 2022-08-17 DIAGNOSIS — S93144A Subluxation of metatarsophalangeal joint of right lesser toe(s), initial encounter: Secondary | ICD-10-CM | POA: Diagnosis not present

## 2022-08-17 DIAGNOSIS — S92311A Displaced fracture of first metatarsal bone, right foot, initial encounter for closed fracture: Secondary | ICD-10-CM | POA: Diagnosis not present

## 2022-08-17 DIAGNOSIS — M79671 Pain in right foot: Secondary | ICD-10-CM | POA: Diagnosis not present

## 2022-08-17 DIAGNOSIS — S93314A Dislocation of tarsal joint of right foot, initial encounter: Secondary | ICD-10-CM | POA: Diagnosis not present

## 2022-08-17 DIAGNOSIS — M2141 Flat foot [pes planus] (acquired), right foot: Secondary | ICD-10-CM | POA: Diagnosis not present

## 2022-08-17 DIAGNOSIS — S92241A Displaced fracture of medial cuneiform of right foot, initial encounter for closed fracture: Secondary | ICD-10-CM | POA: Diagnosis not present

## 2022-08-17 DIAGNOSIS — M205X2 Other deformities of toe(s) (acquired), left foot: Secondary | ICD-10-CM | POA: Diagnosis not present

## 2022-08-17 DIAGNOSIS — Q72891 Other reduction defects of right lower limb: Secondary | ICD-10-CM | POA: Diagnosis not present

## 2022-08-23 DIAGNOSIS — M1811 Unilateral primary osteoarthritis of first carpometacarpal joint, right hand: Secondary | ICD-10-CM | POA: Diagnosis not present

## 2022-08-23 DIAGNOSIS — G90511 Complex regional pain syndrome I of right upper limb: Secondary | ICD-10-CM | POA: Diagnosis not present

## 2022-08-23 DIAGNOSIS — G5631 Lesion of radial nerve, right upper limb: Secondary | ICD-10-CM | POA: Diagnosis not present

## 2022-08-23 DIAGNOSIS — G5611 Other lesions of median nerve, right upper limb: Secondary | ICD-10-CM | POA: Diagnosis not present

## 2022-08-23 DIAGNOSIS — L905 Scar conditions and fibrosis of skin: Secondary | ICD-10-CM | POA: Diagnosis not present

## 2022-08-23 DIAGNOSIS — G5601 Carpal tunnel syndrome, right upper limb: Secondary | ICD-10-CM | POA: Diagnosis not present

## 2022-08-30 ENCOUNTER — Other Ambulatory Visit: Payer: Self-pay | Admitting: Family Medicine

## 2022-08-30 NOTE — Telephone Encounter (Signed)
Please verify that she has rx on file for this month at pharmacy.  We can fill with next set if they request it next month.  Thanks.

## 2022-08-30 NOTE — Telephone Encounter (Signed)
Patient should have rx on file at pharmacy to fill on 09/01/22. Please deny request

## 2022-09-27 ENCOUNTER — Other Ambulatory Visit: Payer: Self-pay | Admitting: Family Medicine

## 2022-10-05 ENCOUNTER — Encounter: Payer: Self-pay | Admitting: Family Medicine

## 2022-10-05 ENCOUNTER — Ambulatory Visit (INDEPENDENT_AMBULATORY_CARE_PROVIDER_SITE_OTHER): Payer: Medicare HMO | Admitting: Family Medicine

## 2022-10-05 VITALS — BP 102/62 | HR 67 | Temp 98.0°F | Ht 64.0 in | Wt 189.0 lb

## 2022-10-05 DIAGNOSIS — R262 Difficulty in walking, not elsewhere classified: Secondary | ICD-10-CM | POA: Diagnosis not present

## 2022-10-05 DIAGNOSIS — E119 Type 2 diabetes mellitus without complications: Secondary | ICD-10-CM | POA: Diagnosis not present

## 2022-10-05 DIAGNOSIS — G8929 Other chronic pain: Secondary | ICD-10-CM | POA: Diagnosis not present

## 2022-10-05 DIAGNOSIS — E559 Vitamin D deficiency, unspecified: Secondary | ICD-10-CM | POA: Diagnosis not present

## 2022-10-05 DIAGNOSIS — E538 Deficiency of other specified B group vitamins: Secondary | ICD-10-CM | POA: Diagnosis not present

## 2022-10-05 LAB — VITAMIN B12: Vitamin B-12: 168 pg/mL — ABNORMAL LOW (ref 211–911)

## 2022-10-05 LAB — HEMOGLOBIN A1C: Hgb A1c MFr Bld: 6.1 % (ref 4.6–6.5)

## 2022-10-05 LAB — VITAMIN D 25 HYDROXY (VIT D DEFICIENCY, FRACTURES): VITD: 38.72 ng/mL (ref 30.00–100.00)

## 2022-10-05 NOTE — Patient Instructions (Addendum)
You can call for a mammogram at the [facility] [address] [place] [address] [place]  Go to the lab on the way out.   If you have mychart we'll likely use that to update you.    Take care.  Glad to see you. I'll work on your forms.

## 2022-10-05 NOTE — Progress Notes (Signed)
She is trying orthotics but still having pain.  She has plan for possible surgery on R foot.  Still with chronic pain at baseline on baseline meds w/o sedation.  Foot pain limits her ability to stand.  She needs forms filled out based on her functional status.  See hardcopy.  She can stand for 15 minutes maximal due to foot pain.  Otherwise her functional status has not significantly changed.  Diabetes:  No meds.  Hypoglycemic episodes: no sx  Hyperglycemic episodes: no sx Feet problems: see above.  Blood Sugars averaging: not checked.  eye exam within last year: d/w pt about follow up.   Labs pending.  She has been limiting carb intake.   Exercise limited by ortho issues.   Discussed deferring some health maint issues (ie MALB) until we get follow up labs done.    She can call about a mammogram.  D/w pt.  See AVS.   H/o low vit D and B12, recheck pending.    Meds, vitals, and allergies reviewed.   ROS: Per HPI unless specifically indicated in ROS section   Nad Ncat Neck supple, no LA Rrr Ctab Abd soft, not ttp Trace BLE  R wrist in brace.   Skin well-perfused.

## 2022-10-08 ENCOUNTER — Other Ambulatory Visit: Payer: Self-pay | Admitting: Family Medicine

## 2022-10-08 MED ORDER — VITAMIN D3 50 MCG (2000 UT) PO CAPS: 2000.0000 [IU] | ORAL_CAPSULE | Freq: Every day | ORAL | Status: DC

## 2022-10-08 NOTE — Assessment & Plan Note (Signed)
No medications used currently.  See notes on labs.

## 2022-10-08 NOTE — Assessment & Plan Note (Signed)
See notes on labs. 

## 2022-10-08 NOTE — Assessment & Plan Note (Signed)
Still with chronic joint pain at baseline.  Taking hydrocodone/tramadol at baseline but not sedated.  See above.

## 2022-10-08 NOTE — Assessment & Plan Note (Signed)
With chronic joint pain at baseline.  See above.  I will work on her forms.

## 2022-10-11 ENCOUNTER — Other Ambulatory Visit: Payer: Self-pay | Admitting: Family Medicine

## 2022-10-11 DIAGNOSIS — E538 Deficiency of other specified B group vitamins: Secondary | ICD-10-CM

## 2022-10-11 MED ORDER — CYANOCOBALAMIN 1000 MCG/ML IJ SOLN
INTRAMUSCULAR | 2 refills | Status: DC
Start: 2022-10-11 — End: 2023-04-06

## 2022-10-11 MED ORDER — BD INTEGRA SYRINGE 25G X 1" 3 ML MISC
1 refills | Status: DC
Start: 2022-10-11 — End: 2023-10-22

## 2022-10-12 ENCOUNTER — Other Ambulatory Visit: Payer: Self-pay | Admitting: Family Medicine

## 2022-10-12 DIAGNOSIS — Z1231 Encounter for screening mammogram for malignant neoplasm of breast: Secondary | ICD-10-CM

## 2022-10-25 ENCOUNTER — Ambulatory Visit
Admission: RE | Admit: 2022-10-25 | Discharge: 2022-10-25 | Disposition: A | Discharge status: Home / Self Care | Payer: Medicare HMO | Source: Ambulatory Visit | Attending: Family Medicine | Admitting: Family Medicine

## 2022-10-25 DIAGNOSIS — Z1231 Encounter for screening mammogram for malignant neoplasm of breast: Secondary | ICD-10-CM

## 2022-10-26 ENCOUNTER — Other Ambulatory Visit: Payer: Self-pay | Admitting: Family Medicine

## 2022-10-26 DIAGNOSIS — G8929 Other chronic pain: Secondary | ICD-10-CM

## 2022-10-26 DIAGNOSIS — G9050 Complex regional pain syndrome I, unspecified: Secondary | ICD-10-CM

## 2022-10-26 NOTE — Telephone Encounter (Signed)
Refill request for HYDROCODONE BITARTRATE/ACETAMINOPHEN 5-325MG  TABLET   LOV - 10/05/22 Next OV - 04/06/23 Last refill - 07/06/22 #180/0

## 2022-10-27 NOTE — Telephone Encounter (Signed)
Sent. Thanks.   

## 2022-11-24 ENCOUNTER — Other Ambulatory Visit: Payer: Self-pay | Admitting: Family Medicine

## 2022-11-24 DIAGNOSIS — G9050 Complex regional pain syndrome I, unspecified: Secondary | ICD-10-CM

## 2022-11-24 DIAGNOSIS — G8929 Other chronic pain: Secondary | ICD-10-CM

## 2022-11-24 NOTE — Telephone Encounter (Signed)
LAST APPOINTMENT DATE: 10/05/22   NEXT APPOINTMENT DATE: 04/06/23    LAST REFILL: 10/27/22  QTY:180 no rf   No UDS or contract on file

## 2022-11-24 NOTE — Telephone Encounter (Signed)
Sent. Thanks.   

## 2022-12-18 DIAGNOSIS — G5631 Lesion of radial nerve, right upper limb: Secondary | ICD-10-CM | POA: Diagnosis not present

## 2022-12-18 DIAGNOSIS — G90512 Complex regional pain syndrome I of left upper limb: Secondary | ICD-10-CM | POA: Diagnosis not present

## 2022-12-18 DIAGNOSIS — G5611 Other lesions of median nerve, right upper limb: Secondary | ICD-10-CM | POA: Diagnosis not present

## 2022-12-18 DIAGNOSIS — G5601 Carpal tunnel syndrome, right upper limb: Secondary | ICD-10-CM | POA: Diagnosis not present

## 2022-12-18 DIAGNOSIS — G90511 Complex regional pain syndrome I of right upper limb: Secondary | ICD-10-CM | POA: Diagnosis not present

## 2022-12-18 DIAGNOSIS — G90513 Complex regional pain syndrome I of upper limb, bilateral: Secondary | ICD-10-CM | POA: Diagnosis not present

## 2022-12-25 ENCOUNTER — Other Ambulatory Visit: Payer: Self-pay | Admitting: Family Medicine

## 2022-12-25 DIAGNOSIS — G9050 Complex regional pain syndrome I, unspecified: Secondary | ICD-10-CM

## 2022-12-25 DIAGNOSIS — G8929 Other chronic pain: Secondary | ICD-10-CM

## 2022-12-25 NOTE — Telephone Encounter (Signed)
Medication: Hydrocone Bitratrate/Acetaminophen 5-325 mg Directions: Take 1-2 tablets po every 6 hours PRN for pain Last given: 11/24/22 Number refills: 0 Last o/v: 10/05/22 Follow up: 04/06/23 Labs: 10/05/22  Medication: Tramadol 50 mg  Directions: Take 1-2 tablets po every 6 hours as needed for pain  Last given: 07/06/22 Number refills: 2 Last o/v: 10/05/22 Follow up: 04/06/23 Labs: 10/05/22

## 2022-12-25 NOTE — Telephone Encounter (Signed)
Sent. Thanks.   

## 2023-01-08 ENCOUNTER — Other Ambulatory Visit: Payer: Self-pay | Admitting: Family Medicine

## 2023-01-08 DIAGNOSIS — G8929 Other chronic pain: Secondary | ICD-10-CM

## 2023-01-08 DIAGNOSIS — G9050 Complex regional pain syndrome I, unspecified: Secondary | ICD-10-CM

## 2023-01-10 ENCOUNTER — Other Ambulatory Visit (INDEPENDENT_AMBULATORY_CARE_PROVIDER_SITE_OTHER): Payer: Medicare HMO

## 2023-01-10 DIAGNOSIS — E538 Deficiency of other specified B group vitamins: Secondary | ICD-10-CM

## 2023-01-10 LAB — VITAMIN B12: Vitamin B-12: 731 pg/mL (ref 211–911)

## 2023-01-26 ENCOUNTER — Other Ambulatory Visit: Payer: Self-pay | Admitting: Family Medicine

## 2023-01-26 DIAGNOSIS — G8929 Other chronic pain: Secondary | ICD-10-CM

## 2023-01-26 DIAGNOSIS — G9050 Complex regional pain syndrome I, unspecified: Secondary | ICD-10-CM

## 2023-01-26 NOTE — Telephone Encounter (Signed)
Sent. Thanks.   

## 2023-01-26 NOTE — Telephone Encounter (Signed)
Refill request for HYDROCODONE BITARTRATE/ACETAMINOPHEN 5-325MG  TABLET   LOV - 10/05/22 Next OV - 04/06/23 Last refill - 12/25/22 #180/0

## 2023-02-04 IMAGING — MG DIGITAL DIAGNOSTIC BILAT W/ TOMO W/ CAD
6 of 12 series · 6 of 36 positions shown · non-contrast
Comparison: Previous exam(s).

CLINICAL DATA: Patient describes diffuse bilateral breast pain and
2 lumps in the LEFT breast.

EXAM:
DIGITAL DIAGNOSTIC BILATERAL MAMMOGRAM WITH TOMOSYNTHESIS AND CAD;
ULTRASOUND LEFT BREAST LIMITED
TECHNIQUE: Bilateral digital diagnostic mammography and breast tomosynthesis
was performed. Digital images of the breasts were evaluated with
computer-aided detection. Targeted ultrasound examination of the
Left breast was performed.

[L MLO synth-2D]
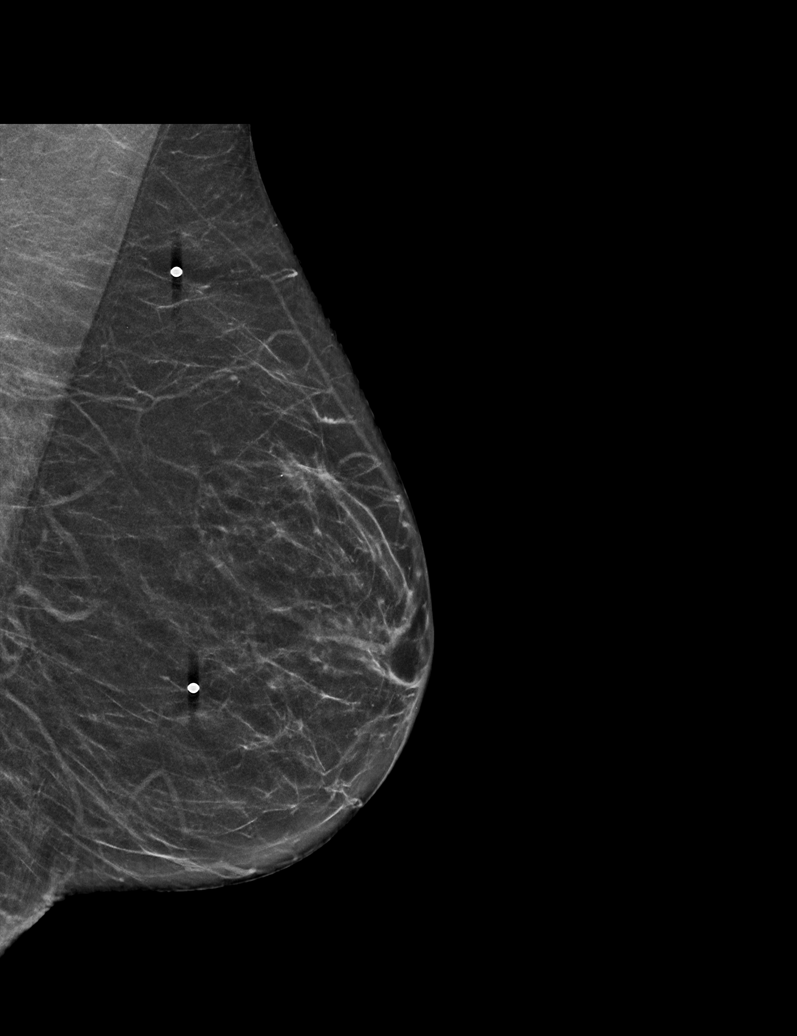

[R CC synth-2D]
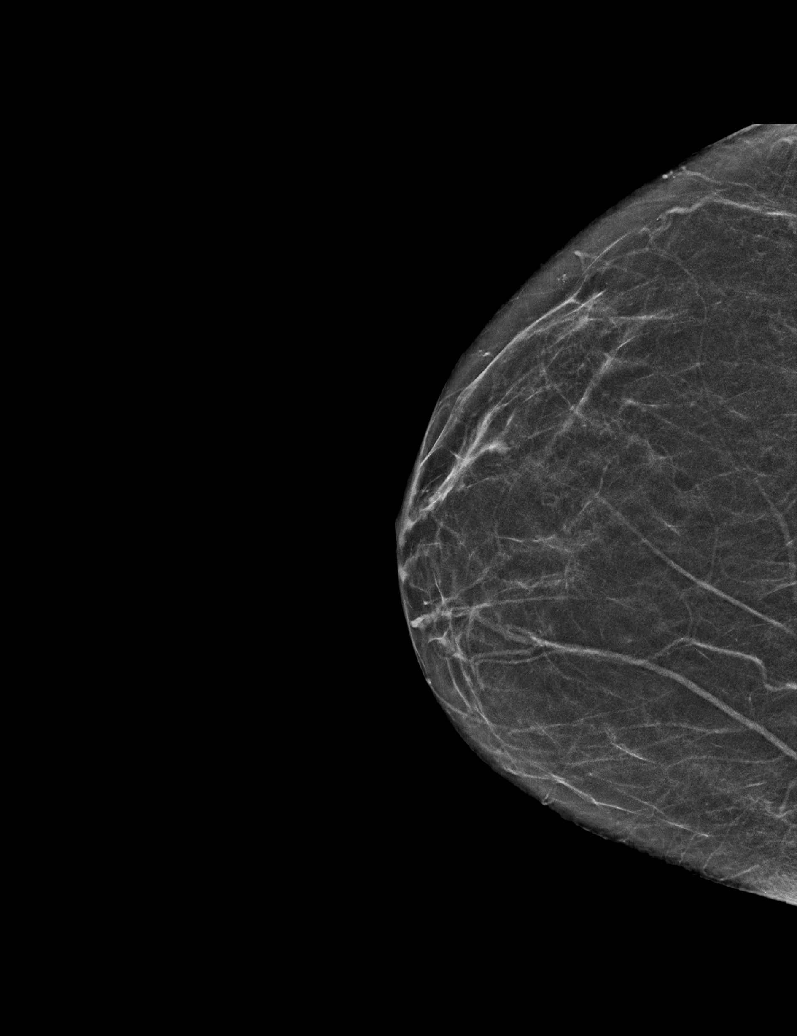

[R MLO synth-2D]
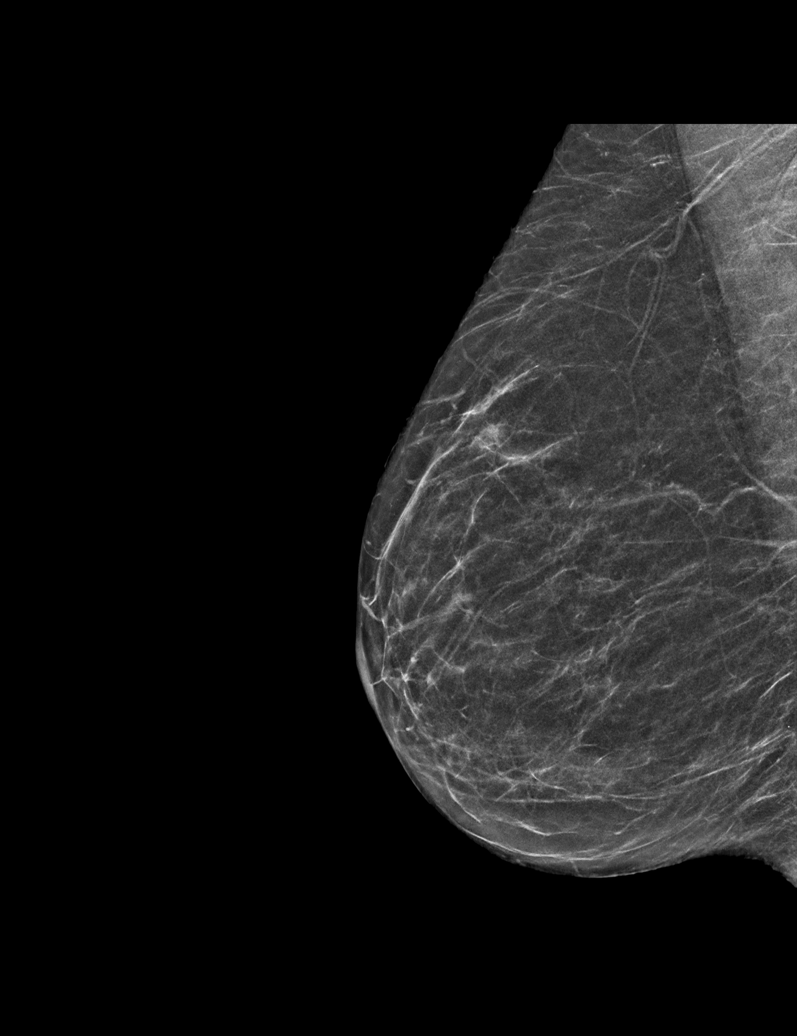

[L TAN synth-2D (1 of 2)]
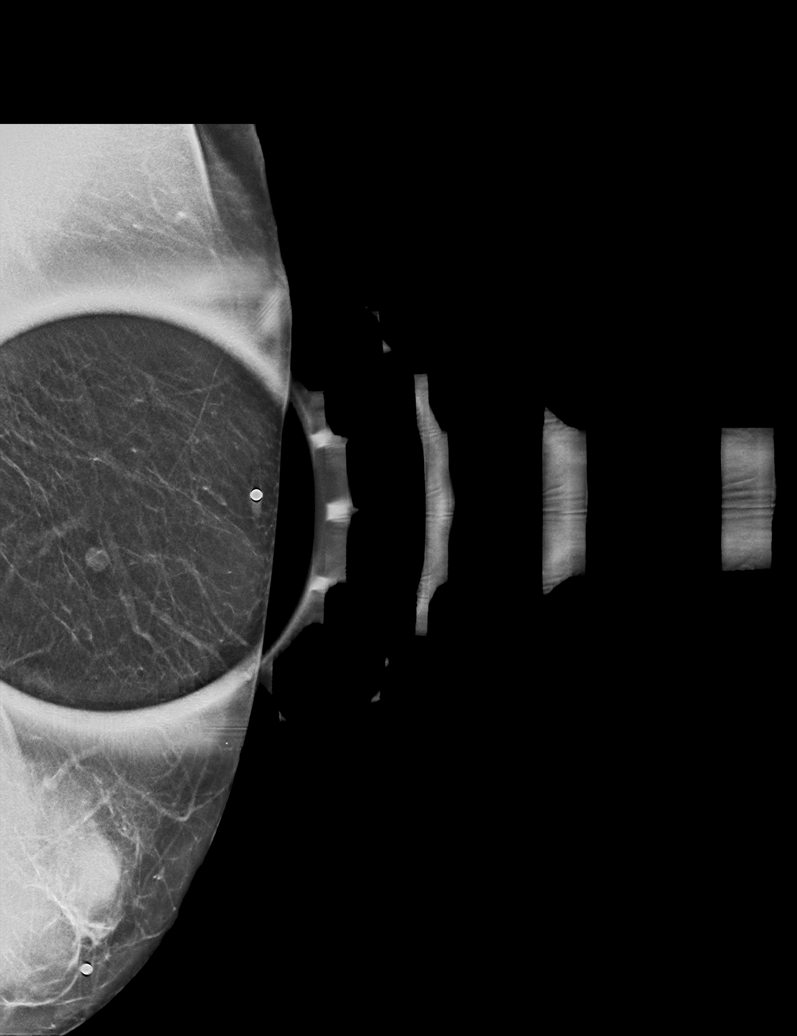

[L TAN synth-2D (2 of 2)]
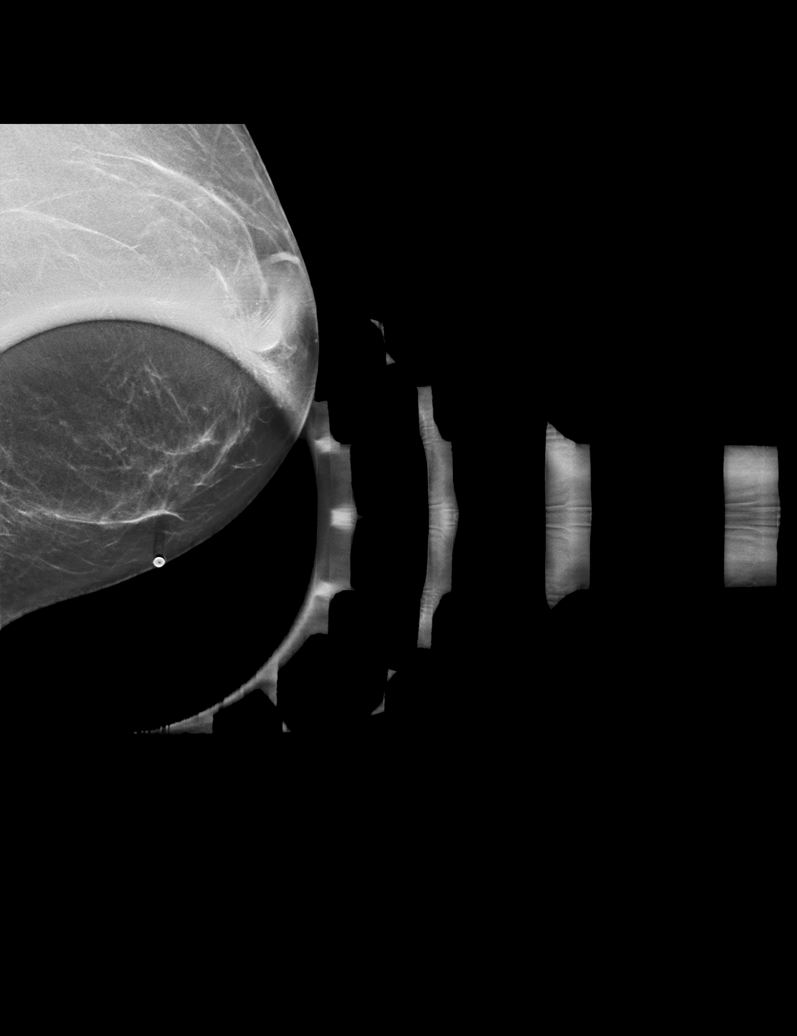

[L CC synth-2D]
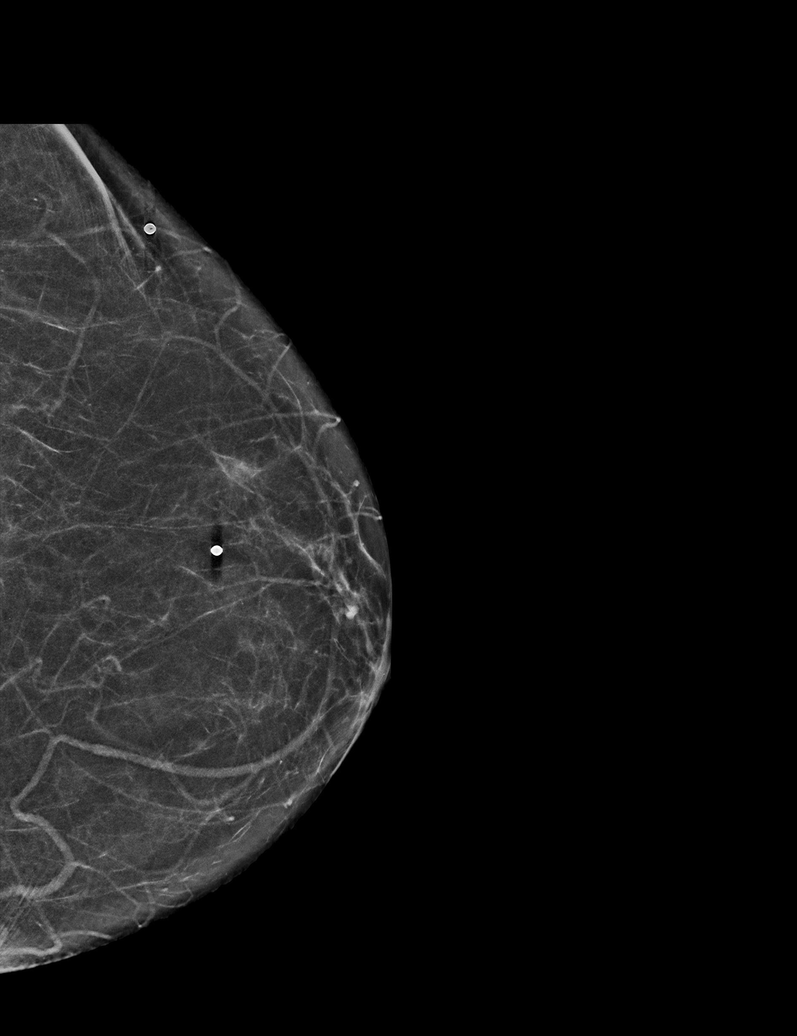

[6 of 36 positions shown; findings below may reference images not displayed]

ACR Breast Density Category b: There are scattered areas of
fibroglandular density.
FINDINGS: RIGHT breast negative.

Spot tangential views are performed in the 2 areas of concern in the
LEFT breast. These views show fibrofatty tissue without mass.

On physical exam, I palpate no discrete mass in the 2 o'clock or 4-5
o'clock locations of the LEFT breast.

Targeted ultrasound is performed, showing normal appearing
fibroglandular tissue in the areas of concern in the LEFT breast. No
suspicious mass, distortion, or acoustic shadowing is demonstrated
with ultrasound.
IMPRESSION: No mammographic or ultrasound evidence for malignancy.

RECOMMENDATION:
Screening mammogram in one year.(Code:E4-Y-V33)

I have discussed the findings and recommendations with the patient.
If applicable, a reminder letter will be sent to the patient
regarding the next appointment.

BI-RADS CATEGORY  1: Negative.

## 2023-03-02 ENCOUNTER — Other Ambulatory Visit: Payer: Self-pay | Admitting: Family Medicine

## 2023-03-02 DIAGNOSIS — G8929 Other chronic pain: Secondary | ICD-10-CM

## 2023-03-02 DIAGNOSIS — G9050 Complex regional pain syndrome I, unspecified: Secondary | ICD-10-CM

## 2023-03-02 NOTE — Telephone Encounter (Signed)
Called patient let her know that 3 scripts were called in last month. She states she called and they told her they needed to request refill from our office. I have called and verified they have ready for pick up now. Will send patient my chart message. Not able to decline request due to type of med.

## 2023-03-04 NOTE — Telephone Encounter (Signed)
Noted. Thanks.

## 2023-03-25 ENCOUNTER — Other Ambulatory Visit: Payer: Self-pay | Admitting: Family Medicine

## 2023-03-25 DIAGNOSIS — E559 Vitamin D deficiency, unspecified: Secondary | ICD-10-CM

## 2023-03-25 DIAGNOSIS — E785 Hyperlipidemia, unspecified: Secondary | ICD-10-CM

## 2023-03-25 DIAGNOSIS — R739 Hyperglycemia, unspecified: Secondary | ICD-10-CM

## 2023-03-25 DIAGNOSIS — M8580 Other specified disorders of bone density and structure, unspecified site: Secondary | ICD-10-CM

## 2023-03-28 ENCOUNTER — Other Ambulatory Visit: Payer: Medicare HMO

## 2023-03-28 DIAGNOSIS — R739 Hyperglycemia, unspecified: Secondary | ICD-10-CM

## 2023-03-28 DIAGNOSIS — M8580 Other specified disorders of bone density and structure, unspecified site: Secondary | ICD-10-CM

## 2023-03-28 DIAGNOSIS — E785 Hyperlipidemia, unspecified: Secondary | ICD-10-CM

## 2023-03-28 DIAGNOSIS — E559 Vitamin D deficiency, unspecified: Secondary | ICD-10-CM | POA: Diagnosis not present

## 2023-03-28 LAB — LIPID PANEL
Cholesterol: 218 mg/dL — ABNORMAL HIGH (ref 0–200)
HDL: 60.3 mg/dL (ref >39.00)
LDL Cholesterol: 138 mg/dL — ABNORMAL HIGH (ref 0–99)
NonHDL: 157.63
Total CHOL/HDL Ratio: 4
Triglycerides: 96 mg/dL (ref 0.0–149.0)
VLDL: 19.2 mg/dL (ref 0.0–40.0)

## 2023-03-28 LAB — CBC WITH DIFFERENTIAL/PLATELET
Basophils Absolute: 0.1 10*3/uL (ref 0.0–0.1)
Basophils Relative: 0.9 % (ref 0.0–3.0)
Eosinophils Absolute: 0.2 10*3/uL (ref 0.0–0.7)
Eosinophils Relative: 3 % (ref 0.0–5.0)
HCT: 40.7 % (ref 36.0–46.0)
Hemoglobin: 13 g/dL (ref 12.0–15.0)
Lymphocytes Relative: 30.4 % (ref 12.0–46.0)
Lymphs Abs: 2.1 10*3/uL (ref 0.7–4.0)
MCHC: 32 g/dL (ref 30.0–36.0)
MCV: 88.7 fL (ref 78.0–100.0)
Monocytes Absolute: 0.5 10*3/uL (ref 0.1–1.0)
Monocytes Relative: 7.1 % (ref 3.0–12.0)
Neutro Abs: 4 10*3/uL (ref 1.4–7.7)
Neutrophils Relative %: 58.6 % (ref 43.0–77.0)
Platelets: 329 10*3/uL (ref 150.0–400.0)
RBC: 4.59 Mil/uL (ref 3.87–5.11)
RDW: 14.4 % (ref 11.5–15.5)
WBC: 6.8 10*3/uL (ref 4.0–10.5)

## 2023-03-28 LAB — COMPREHENSIVE METABOLIC PANEL
ALT: 10 U/L (ref 0–35)
AST: 8 U/L (ref 0–37)
Albumin: 4.3 g/dL (ref 3.5–5.2)
Alkaline Phosphatase: 69 U/L (ref 39–117)
BUN: 16 mg/dL (ref 6–23)
CO2: 31 meq/L (ref 19–32)
Calcium: 9.8 mg/dL (ref 8.4–10.5)
Chloride: 98 meq/L (ref 96–112)
Creatinine, Ser: 0.7 mg/dL (ref 0.40–1.20)
GFR: 91.94 mL/min (ref >60.00)
Glucose, Bld: 103 mg/dL — ABNORMAL HIGH (ref 70–99)
Potassium: 3.5 meq/L (ref 3.5–5.1)
Sodium: 138 meq/L (ref 135–145)
Total Bilirubin: 0.5 mg/dL (ref 0.2–1.2)
Total Protein: 6.9 g/dL (ref 6.0–8.3)

## 2023-03-28 LAB — HEMOGLOBIN A1C: Hgb A1c MFr Bld: 6.2 % (ref 4.6–6.5)

## 2023-03-28 LAB — VITAMIN D 25 HYDROXY (VIT D DEFICIENCY, FRACTURES): VITD: 36.54 ng/mL (ref 30.00–100.00)

## 2023-03-29 ENCOUNTER — Other Ambulatory Visit: Payer: Medicare HMO

## 2023-04-06 ENCOUNTER — Other Ambulatory Visit (HOSPITAL_COMMUNITY)
Admission: RE | Admit: 2023-04-06 | Discharge: 2023-04-06 | Disposition: A | Discharge status: Home / Self Care | Payer: Medicare HMO | Source: Ambulatory Visit | Attending: Family Medicine | Admitting: Family Medicine

## 2023-04-06 ENCOUNTER — Ambulatory Visit (INDEPENDENT_AMBULATORY_CARE_PROVIDER_SITE_OTHER): Payer: Medicare HMO | Admitting: Family Medicine

## 2023-04-06 ENCOUNTER — Other Ambulatory Visit: Payer: Medicare HMO

## 2023-04-06 ENCOUNTER — Encounter: Payer: Self-pay | Admitting: Family Medicine

## 2023-04-06 VITALS — BP 122/86 | HR 68 | Temp 97.9°F | Ht 63.5 in | Wt 193.0 lb

## 2023-04-06 DIAGNOSIS — Z0000 Encounter for general adult medical examination without abnormal findings: Secondary | ICD-10-CM

## 2023-04-06 DIAGNOSIS — E538 Deficiency of other specified B group vitamins: Secondary | ICD-10-CM

## 2023-04-06 DIAGNOSIS — Z01419 Encounter for gynecological examination (general) (routine) without abnormal findings: Secondary | ICD-10-CM

## 2023-04-06 DIAGNOSIS — Z1151 Encounter for screening for human papillomavirus (HPV): Secondary | ICD-10-CM | POA: Insufficient documentation

## 2023-04-06 DIAGNOSIS — R1310 Dysphagia, unspecified: Secondary | ICD-10-CM | POA: Diagnosis not present

## 2023-04-06 DIAGNOSIS — G8929 Other chronic pain: Secondary | ICD-10-CM | POA: Diagnosis not present

## 2023-04-06 DIAGNOSIS — R609 Edema, unspecified: Secondary | ICD-10-CM

## 2023-04-06 DIAGNOSIS — Z7189 Other specified counseling: Secondary | ICD-10-CM

## 2023-04-06 DIAGNOSIS — Z1211 Encounter for screening for malignant neoplasm of colon: Secondary | ICD-10-CM

## 2023-04-06 DIAGNOSIS — R739 Hyperglycemia, unspecified: Secondary | ICD-10-CM

## 2023-04-06 MED ORDER — HYDROCHLOROTHIAZIDE 25 MG PO TABS: 50.0000 mg | ORAL_TABLET | Freq: Every day | ORAL | 3 refills | Status: DC

## 2023-04-06 MED ORDER — CYANOCOBALAMIN 1000 MCG/ML IJ SOLN
INTRAMUSCULAR | Status: DC
Start: 2023-04-06 — End: 2023-05-23

## 2023-04-06 NOTE — Progress Notes (Unsigned)
I have personally reviewed the Medicare Annual Wellness questionnaire and have noted 1. The patient's medical and social history 2. Their use of alcohol, tobacco or illicit drugs 3. Their current medications and supplements 4. The patient's functional ability including ADL's, fall risks, home safety risks and hearing or visual             impairment. 5. Diet and physical activities 6. Evidence for depression or mood disorders  The patients weight, height, BMI have been recorded in the chart and visual acuity is per eye clinic.  I have made referrals, counseling and provided education to the patient based review of the above and I have provided the pt with a written personalized care plan for preventive services.  Provider list updated- see scanned forms.  Routine anticipatory guidance given to patient.  See health maintenance. The possibility exists that previously documented standard health maintenance information may have been brought forward from a previous encounter into this note.  If needed, that same information has been updated to reflect the current situation based on today's encounter.    Flu Shingles PNA Tetanus Colon  Breast cancer screening Prostate cancer screening Advance directive Cognitive function addressed- see scanned forms- and if abnormal then additional documentation follows.  Pap due 2024  In addition to [facility] Wellness, follow up visit for the below conditions:  She had used hydrochlorothiazide for BLE edema, off med recently, will restart.    B12 wnl and d/w pt about continuing monthly injection.    Joint pain/chronic pain.  Still on hydrocodone at baseline.  Still taking tramadol.  She has mult med allergies.  Never pain free.  Prev pain clinic eval d/w pt.  Not drowsy.  She had L arm surgery pending.    H/o esophageal dilation years ago.  No with mild dysphagia and she wanted to put off EGD at this point.  She can update me as needed.  Cautions d/w pt.     PMH and SH reviewed  Meds, vitals, and allergies reviewed.   ROS: Per HPI.  Unless specifically indicated otherwise in HPI, the patient denies:  General: fever. Eyes: acute vision changes ENT: sore throat Cardiovascular: chest pain Respiratory: SOB GI: vomiting GU: dysuria Musculoskeletal: acute back pain Derm: acute rash Neuro: acute motor dysfunction Psych: worsening mood Endocrine: polydipsia Heme: bleeding Allergy: hayfever  GEN: nad, alert and oriented HEENT: mucous membranes moist NECK: supple w/o LA CV: rrr. PULM: ctab, no inc wob ABD: soft, +bs EXT: 1+ BLE edema SKIN: no acute rash Chronic joint and arm changes in the hands.

## 2023-04-06 NOTE — Patient Instructions (Signed)
Pap smear today.  Take care.  Glad to see you. Keep going with B12 injections as is.

## 2023-04-06 NOTE — Progress Notes (Unsigned)
Vision Screening   Right eye Left eye Both eyes  Without correction '20/40 20/40 20/40 '$  With correction     Hearing Screening - Comments:: Passed whisper test

## 2023-04-08 ENCOUNTER — Other Ambulatory Visit: Payer: Self-pay | Admitting: Family Medicine

## 2023-04-08 DIAGNOSIS — R1310 Dysphagia, unspecified: Secondary | ICD-10-CM | POA: Insufficient documentation

## 2023-04-08 MED ORDER — BUPROPION HCL ER (SR) 100 MG PO TB12: 100.0000 mg | ORAL_TABLET | Freq: Two times a day (BID) | ORAL | Status: DC

## 2023-04-08 NOTE — Assessment & Plan Note (Signed)
Would continue B12 replacement as is.

## 2023-04-08 NOTE — Assessment & Plan Note (Signed)
H/o esophageal dilation years ago.  No with mild dysphagia and she wanted to put off EGD at this point.  She can update me as needed.  Cautions d/w pt.

## 2023-04-08 NOTE — Assessment & Plan Note (Signed)
Advance directive- husband designated if patient were incapacitated.  

## 2023-04-08 NOTE — Assessment & Plan Note (Addendum)
Still on hydrocodone at baseline.  Still taking tramadol.  She has mult med allergies.  Never pain free.  Prev pain clinic eval d/w pt.  Not drowsy.  She has L arm surgery pending.   She has some relief with combination of hydrocodone and tramadol and I would continue as is.   Chronic pain likely affect her mood, along with significant social upheaval, her son died this year.  Condolences offered.  I told her I wanted to consider options.  See following phone note.

## 2023-04-08 NOTE — Assessment & Plan Note (Signed)
Would restart hydrochlorothiazide.  Update me as needed.

## 2023-04-08 NOTE — Telephone Encounter (Signed)
Please call pt.   reasonable to consider adding on Wellbutrin 100 mg a day to see if it helps her mood.  If she consents, then let me know and I can send the prescription.  Thanks.

## 2023-04-08 NOTE — Assessment & Plan Note (Signed)
Not diabetic based on A1c.  Labs discussed with patient.  Continue work on diet.

## 2023-04-08 NOTE — Assessment & Plan Note (Signed)
Flu encouraged, declined. Shingles encouraged. PNA due at 64. Tetanus encouraged. COVID-vaccine previously done. Routine vaccination encouraged. D/w patient [id number] for colon cancer screening, including IFOB vs. colonoscopy.  Risks and benefits of both were discussed and patient voiced understanding.  Pt elects for: cologuard Breast cancer screening 2024 Bone density test 2021 Advance directive-husband designated if patient were incapacitated. Cognitive function addressed- see scanned forms- and if abnormal then additional documentation follows.  Pap done 2024

## 2023-04-09 DIAGNOSIS — G5602 Carpal tunnel syndrome, left upper limb: Secondary | ICD-10-CM | POA: Diagnosis not present

## 2023-04-09 MED ORDER — BUPROPION HCL ER (SR) 100 MG PO TB12: 100.0000 mg | ORAL_TABLET | Freq: Every day | ORAL | 1 refills | Status: DC

## 2023-04-09 NOTE — Telephone Encounter (Signed)
Sent. Thanks.   

## 2023-04-09 NOTE — Telephone Encounter (Signed)
Called patient she is willing to try medication. She will give [place] a call and let us know how she is doing on it after a few weeks.

## 2023-04-11 LAB — CYTOLOGY - PAP
Comment: NEGATIVE
Diagnosis: NEGATIVE
High risk HPV: NEGATIVE

## 2023-04-27 ENCOUNTER — Other Ambulatory Visit: Payer: Self-pay | Admitting: Family Medicine

## 2023-04-27 DIAGNOSIS — G8929 Other chronic pain: Secondary | ICD-10-CM

## 2023-04-27 DIAGNOSIS — G9050 Complex regional pain syndrome I, unspecified: Secondary | ICD-10-CM

## 2023-04-27 NOTE — Telephone Encounter (Signed)
LAST APPOINTMENT DATE: 04/06/23   NEXT APPOINTMENT DATE: Nothing scheduled   LAST REFILL: TRAMADOL 12/25/22, HYDROcodone-acetaminophen (NORCO/VICODIN) 5-325 MG tablet 01/26/2023

## 2023-04-29 MED ORDER — HYDROCODONE-ACETAMINOPHEN 5-325 MG PO TABS: ORAL_TABLET | ORAL | 0 refills | Status: DC

## 2023-05-21 ENCOUNTER — Other Ambulatory Visit: Payer: Self-pay | Admitting: Family Medicine

## 2023-05-21 DIAGNOSIS — E538 Deficiency of other specified B group vitamins: Secondary | ICD-10-CM

## 2023-05-29 DIAGNOSIS — G5602 Carpal tunnel syndrome, left upper limb: Secondary | ICD-10-CM | POA: Diagnosis not present

## 2023-05-29 DIAGNOSIS — G5682 Other specified mononeuropathies of left upper limb: Secondary | ICD-10-CM | POA: Diagnosis not present

## 2023-05-29 DIAGNOSIS — Z885 Allergy status to narcotic agent status: Secondary | ICD-10-CM | POA: Diagnosis not present

## 2023-05-29 DIAGNOSIS — Z888 Allergy status to other drugs, medicaments and biological substances status: Secondary | ICD-10-CM | POA: Diagnosis not present

## 2023-05-29 DIAGNOSIS — G8918 Other acute postprocedural pain: Secondary | ICD-10-CM | POA: Diagnosis not present

## 2023-05-29 DIAGNOSIS — G5632 Lesion of radial nerve, left upper limb: Secondary | ICD-10-CM | POA: Diagnosis not present

## 2023-05-29 DIAGNOSIS — G5612 Other lesions of median nerve, left upper limb: Secondary | ICD-10-CM | POA: Diagnosis not present

## 2023-05-29 DIAGNOSIS — I872 Venous insufficiency (chronic) (peripheral): Secondary | ICD-10-CM | POA: Diagnosis not present

## 2023-05-29 DIAGNOSIS — Z882 Allergy status to sulfonamides status: Secondary | ICD-10-CM | POA: Diagnosis not present

## 2023-05-29 DIAGNOSIS — Z79899 Other long term (current) drug therapy: Secondary | ICD-10-CM | POA: Diagnosis not present

## 2023-05-30 DIAGNOSIS — Z885 Allergy status to narcotic agent status: Secondary | ICD-10-CM | POA: Diagnosis not present

## 2023-05-30 DIAGNOSIS — Z79899 Other long term (current) drug therapy: Secondary | ICD-10-CM | POA: Diagnosis not present

## 2023-05-30 DIAGNOSIS — Z888 Allergy status to other drugs, medicaments and biological substances status: Secondary | ICD-10-CM | POA: Diagnosis not present

## 2023-05-30 DIAGNOSIS — G5612 Other lesions of median nerve, left upper limb: Secondary | ICD-10-CM | POA: Diagnosis not present

## 2023-05-30 DIAGNOSIS — G8918 Other acute postprocedural pain: Secondary | ICD-10-CM | POA: Diagnosis not present

## 2023-05-30 DIAGNOSIS — G5632 Lesion of radial nerve, left upper limb: Secondary | ICD-10-CM | POA: Diagnosis not present

## 2023-05-30 DIAGNOSIS — I872 Venous insufficiency (chronic) (peripheral): Secondary | ICD-10-CM | POA: Diagnosis not present

## 2023-05-30 DIAGNOSIS — Z882 Allergy status to sulfonamides status: Secondary | ICD-10-CM | POA: Diagnosis not present

## 2023-05-30 DIAGNOSIS — G5602 Carpal tunnel syndrome, left upper limb: Secondary | ICD-10-CM | POA: Diagnosis not present

## 2023-06-11 DIAGNOSIS — Z4789 Encounter for other orthopedic aftercare: Secondary | ICD-10-CM | POA: Diagnosis not present

## 2023-07-02 DIAGNOSIS — M25642 Stiffness of left hand, not elsewhere classified: Secondary | ICD-10-CM | POA: Diagnosis not present

## 2023-07-02 DIAGNOSIS — R200 Anesthesia of skin: Secondary | ICD-10-CM | POA: Diagnosis not present

## 2023-07-02 DIAGNOSIS — R202 Paresthesia of skin: Secondary | ICD-10-CM | POA: Diagnosis not present

## 2023-07-02 DIAGNOSIS — Z789 Other specified health status: Secondary | ICD-10-CM | POA: Diagnosis not present

## 2023-07-02 DIAGNOSIS — M25532 Pain in left wrist: Secondary | ICD-10-CM | POA: Diagnosis not present

## 2023-07-13 DIAGNOSIS — Z4789 Encounter for other orthopedic aftercare: Secondary | ICD-10-CM | POA: Diagnosis not present

## 2023-07-13 DIAGNOSIS — R202 Paresthesia of skin: Secondary | ICD-10-CM | POA: Diagnosis not present

## 2023-07-13 DIAGNOSIS — Z789 Other specified health status: Secondary | ICD-10-CM | POA: Diagnosis not present

## 2023-07-13 DIAGNOSIS — G5631 Lesion of radial nerve, right upper limb: Secondary | ICD-10-CM | POA: Diagnosis not present

## 2023-07-13 DIAGNOSIS — G5632 Lesion of radial nerve, left upper limb: Secondary | ICD-10-CM | POA: Diagnosis not present

## 2023-07-13 DIAGNOSIS — M25642 Stiffness of left hand, not elsewhere classified: Secondary | ICD-10-CM | POA: Diagnosis not present

## 2023-07-13 DIAGNOSIS — R200 Anesthesia of skin: Secondary | ICD-10-CM | POA: Diagnosis not present

## 2023-07-13 DIAGNOSIS — M25532 Pain in left wrist: Secondary | ICD-10-CM | POA: Diagnosis not present

## 2023-07-25 DIAGNOSIS — Z4789 Encounter for other orthopedic aftercare: Secondary | ICD-10-CM | POA: Diagnosis not present

## 2023-07-25 DIAGNOSIS — G5632 Lesion of radial nerve, left upper limb: Secondary | ICD-10-CM | POA: Diagnosis not present

## 2023-07-25 DIAGNOSIS — Z789 Other specified health status: Secondary | ICD-10-CM | POA: Diagnosis not present

## 2023-07-25 DIAGNOSIS — G5631 Lesion of radial nerve, right upper limb: Secondary | ICD-10-CM | POA: Diagnosis not present

## 2023-07-25 DIAGNOSIS — M25532 Pain in left wrist: Secondary | ICD-10-CM | POA: Diagnosis not present

## 2023-07-25 DIAGNOSIS — R200 Anesthesia of skin: Secondary | ICD-10-CM | POA: Diagnosis not present

## 2023-07-25 DIAGNOSIS — M25642 Stiffness of left hand, not elsewhere classified: Secondary | ICD-10-CM | POA: Diagnosis not present

## 2023-07-25 DIAGNOSIS — R202 Paresthesia of skin: Secondary | ICD-10-CM | POA: Diagnosis not present

## 2023-08-07 ENCOUNTER — Other Ambulatory Visit: Payer: Self-pay | Admitting: Family Medicine

## 2023-08-07 DIAGNOSIS — G8929 Other chronic pain: Secondary | ICD-10-CM

## 2023-08-07 DIAGNOSIS — G9050 Complex regional pain syndrome I, unspecified: Secondary | ICD-10-CM

## 2023-08-07 NOTE — Telephone Encounter (Signed)
LAST REFILL: HYDROcodone-acetaminophen (NORCO/VICODIN) 5-325 MG tablet 04/29/2023  180 tblets  LAST OFFICE VISIT: 04/06/23 NEXT OFFICE VISIT: NOTHING SCHEDULED  I did confirm with the pharmacy that she did not have any refills remaining

## 2023-08-08 MED ORDER — HYDROCODONE-ACETAMINOPHEN 5-325 MG PO TABS: ORAL_TABLET | ORAL | 0 refills | Status: DC

## 2023-09-13 ENCOUNTER — Other Ambulatory Visit: Payer: Self-pay | Admitting: Family Medicine

## 2023-09-13 DIAGNOSIS — G9050 Complex regional pain syndrome I, unspecified: Secondary | ICD-10-CM

## 2023-09-13 DIAGNOSIS — G8929 Other chronic pain: Secondary | ICD-10-CM

## 2023-09-14 DIAGNOSIS — Z981 Arthrodesis status: Secondary | ICD-10-CM | POA: Diagnosis not present

## 2023-09-14 DIAGNOSIS — G5632 Lesion of radial nerve, left upper limb: Secondary | ICD-10-CM | POA: Diagnosis not present

## 2023-09-14 DIAGNOSIS — M19011 Primary osteoarthritis, right shoulder: Secondary | ICD-10-CM | POA: Diagnosis not present

## 2023-09-14 DIAGNOSIS — G8929 Other chronic pain: Secondary | ICD-10-CM | POA: Diagnosis not present

## 2023-09-14 DIAGNOSIS — M25511 Pain in right shoulder: Secondary | ICD-10-CM | POA: Diagnosis not present

## 2023-09-14 DIAGNOSIS — M25512 Pain in left shoulder: Secondary | ICD-10-CM | POA: Diagnosis not present

## 2023-09-14 NOTE — Telephone Encounter (Signed)
 Last filled 04-29-23 #240 Last OV 04-06-23 No Future OV  [facility]

## 2023-09-16 NOTE — Telephone Encounter (Signed)
 Sent. Thanks.

## 2023-10-20 ENCOUNTER — Other Ambulatory Visit: Payer: Self-pay | Admitting: Family Medicine

## 2023-10-20 DIAGNOSIS — E538 Deficiency of other specified B group vitamins: Secondary | ICD-10-CM

## 2023-10-26 DIAGNOSIS — G5632 Lesion of radial nerve, left upper limb: Secondary | ICD-10-CM | POA: Diagnosis not present

## 2023-10-26 DIAGNOSIS — M7581 Other shoulder lesions, right shoulder: Secondary | ICD-10-CM | POA: Diagnosis not present

## 2023-11-05 DIAGNOSIS — M7591 Shoulder lesion, unspecified, right shoulder: Secondary | ICD-10-CM | POA: Diagnosis not present

## 2023-11-05 DIAGNOSIS — S46111A Strain of muscle, fascia and tendon of long head of biceps, right arm, initial encounter: Secondary | ICD-10-CM | POA: Diagnosis not present

## 2023-11-05 DIAGNOSIS — M67813 Other specified disorders of tendon, right shoulder: Secondary | ICD-10-CM | POA: Diagnosis not present

## 2023-11-05 DIAGNOSIS — M75111 Incomplete rotator cuff tear or rupture of right shoulder, not specified as traumatic: Secondary | ICD-10-CM | POA: Diagnosis not present

## 2023-11-05 DIAGNOSIS — M7581 Other shoulder lesions, right shoulder: Secondary | ICD-10-CM | POA: Diagnosis not present

## 2023-11-07 ENCOUNTER — Telehealth: Payer: Self-pay

## 2023-11-07 NOTE — Telephone Encounter (Signed)
 Per patient chart review last visit for cpe was 04/06/23. Patient is due for A1C and other DM follow up. Don't see where patient has follow up scheduled ok to call and set up office visit.

## 2023-11-07 NOTE — Telephone Encounter (Signed)
 Please call patient to schedule for her yearly visit in October. Labs can be scheduled for in advance or she can come in the morning fasting for her appointment and we will do labs at the time of visit.

## 2023-11-07 NOTE — Telephone Encounter (Signed)
 I think it makes sense to get yearly follow up done later this year.  She isn't currently diabetic, based on most recent labs.  Thanks.

## 2023-11-08 NOTE — Telephone Encounter (Signed)
 lvm for pt to call office to schedule appt.

## 2023-11-14 ENCOUNTER — Other Ambulatory Visit: Payer: Self-pay | Admitting: Family Medicine

## 2023-11-14 DIAGNOSIS — G9050 Complex regional pain syndrome I, unspecified: Secondary | ICD-10-CM

## 2023-11-14 DIAGNOSIS — G8929 Other chronic pain: Secondary | ICD-10-CM

## 2023-11-16 MED ORDER — HYDROCODONE-ACETAMINOPHEN 5-325 MG PO TABS: ORAL_TABLET | ORAL | 0 refills | Status: DC

## 2023-11-16 NOTE — Telephone Encounter (Signed)
PDMP reviewed. Rx sent.

## 2023-12-03 DIAGNOSIS — M7591 Shoulder lesion, unspecified, right shoulder: Secondary | ICD-10-CM | POA: Diagnosis not present

## 2023-12-03 DIAGNOSIS — M792 Neuralgia and neuritis, unspecified: Secondary | ICD-10-CM | POA: Diagnosis not present

## 2024-02-02 ENCOUNTER — Other Ambulatory Visit: Payer: Self-pay | Admitting: Family Medicine

## 2024-02-02 DIAGNOSIS — G9050 Complex regional pain syndrome I, unspecified: Secondary | ICD-10-CM

## 2024-02-02 DIAGNOSIS — G8929 Other chronic pain: Secondary | ICD-10-CM

## 2024-02-22 DIAGNOSIS — M19042 Primary osteoarthritis, left hand: Secondary | ICD-10-CM | POA: Diagnosis not present

## 2024-02-22 DIAGNOSIS — M25521 Pain in right elbow: Secondary | ICD-10-CM | POA: Diagnosis not present

## 2024-02-22 DIAGNOSIS — G5632 Lesion of radial nerve, left upper limb: Secondary | ICD-10-CM | POA: Diagnosis not present

## 2024-02-29 ENCOUNTER — Other Ambulatory Visit: Payer: Self-pay | Admitting: Family Medicine

## 2024-02-29 DIAGNOSIS — G9050 Complex regional pain syndrome I, unspecified: Secondary | ICD-10-CM

## 2024-02-29 DIAGNOSIS — G8929 Other chronic pain: Secondary | ICD-10-CM

## 2024-02-29 NOTE — Telephone Encounter (Signed)
 LOV: 04/06/2023 NOV: nothing scheduled Last Refill: HYDROcodone -acetaminophen  (NORCO/VICODIN) 5-325 MG tablet  11/16/2023 180 tablets 0 refills

## 2024-03-06 DIAGNOSIS — M7591 Shoulder lesion, unspecified, right shoulder: Secondary | ICD-10-CM | POA: Diagnosis not present

## 2024-03-27 DIAGNOSIS — G90513 Complex regional pain syndrome I of upper limb, bilateral: Secondary | ICD-10-CM | POA: Diagnosis not present

## 2024-03-27 DIAGNOSIS — G5632 Lesion of radial nerve, left upper limb: Secondary | ICD-10-CM | POA: Diagnosis not present

## 2024-03-27 DIAGNOSIS — J45909 Unspecified asthma, uncomplicated: Secondary | ICD-10-CM | POA: Diagnosis not present

## 2024-03-27 DIAGNOSIS — M19072 Primary osteoarthritis, left ankle and foot: Secondary | ICD-10-CM | POA: Diagnosis not present

## 2024-03-27 DIAGNOSIS — G5692 Unspecified mononeuropathy of left upper limb: Secondary | ICD-10-CM | POA: Diagnosis not present

## 2024-03-27 DIAGNOSIS — M180 Bilateral primary osteoarthritis of first carpometacarpal joints: Secondary | ICD-10-CM | POA: Diagnosis not present

## 2024-03-27 DIAGNOSIS — M19042 Primary osteoarthritis, left hand: Secondary | ICD-10-CM | POA: Diagnosis not present

## 2024-03-27 DIAGNOSIS — I10 Essential (primary) hypertension: Secondary | ICD-10-CM | POA: Diagnosis not present

## 2024-03-27 DIAGNOSIS — I872 Venous insufficiency (chronic) (peripheral): Secondary | ICD-10-CM | POA: Diagnosis not present

## 2024-03-27 DIAGNOSIS — G8389 Other specified paralytic syndromes: Secondary | ICD-10-CM | POA: Diagnosis not present

## 2024-03-28 DIAGNOSIS — G8389 Other specified paralytic syndromes: Secondary | ICD-10-CM | POA: Diagnosis not present

## 2024-03-28 DIAGNOSIS — G90513 Complex regional pain syndrome I of upper limb, bilateral: Secondary | ICD-10-CM | POA: Diagnosis not present

## 2024-03-28 DIAGNOSIS — M19072 Primary osteoarthritis, left ankle and foot: Secondary | ICD-10-CM | POA: Diagnosis not present

## 2024-03-28 DIAGNOSIS — J45909 Unspecified asthma, uncomplicated: Secondary | ICD-10-CM | POA: Diagnosis not present

## 2024-03-28 DIAGNOSIS — M180 Bilateral primary osteoarthritis of first carpometacarpal joints: Secondary | ICD-10-CM | POA: Diagnosis not present

## 2024-03-28 DIAGNOSIS — G5632 Lesion of radial nerve, left upper limb: Secondary | ICD-10-CM | POA: Diagnosis not present

## 2024-03-28 DIAGNOSIS — M19042 Primary osteoarthritis, left hand: Secondary | ICD-10-CM | POA: Diagnosis not present

## 2024-03-28 DIAGNOSIS — I10 Essential (primary) hypertension: Secondary | ICD-10-CM | POA: Diagnosis not present

## 2024-03-28 DIAGNOSIS — I872 Venous insufficiency (chronic) (peripheral): Secondary | ICD-10-CM | POA: Diagnosis not present

## 2024-04-10 DIAGNOSIS — M19042 Primary osteoarthritis, left hand: Secondary | ICD-10-CM | POA: Diagnosis not present

## 2024-04-10 DIAGNOSIS — Z96692 Finger-joint replacement of left hand: Secondary | ICD-10-CM | POA: Diagnosis not present

## 2024-04-10 DIAGNOSIS — Z9889 Other specified postprocedural states: Secondary | ICD-10-CM | POA: Diagnosis not present

## 2024-04-10 DIAGNOSIS — Z4789 Encounter for other orthopedic aftercare: Secondary | ICD-10-CM | POA: Diagnosis not present

## 2024-04-10 DIAGNOSIS — Z471 Aftercare following joint replacement surgery: Secondary | ICD-10-CM | POA: Diagnosis not present

## 2024-04-11 DIAGNOSIS — R29898 Other symptoms and signs involving the musculoskeletal system: Secondary | ICD-10-CM | POA: Diagnosis not present

## 2024-04-11 DIAGNOSIS — M25532 Pain in left wrist: Secondary | ICD-10-CM | POA: Diagnosis not present

## 2024-04-11 DIAGNOSIS — M25642 Stiffness of left hand, not elsewhere classified: Secondary | ICD-10-CM | POA: Diagnosis not present

## 2024-04-17 ENCOUNTER — Other Ambulatory Visit: Payer: Self-pay | Admitting: Family Medicine

## 2024-04-17 DIAGNOSIS — G9050 Complex regional pain syndrome I, unspecified: Secondary | ICD-10-CM

## 2024-04-17 DIAGNOSIS — G8929 Other chronic pain: Secondary | ICD-10-CM

## 2024-04-17 DIAGNOSIS — R609 Edema, unspecified: Secondary | ICD-10-CM

## 2024-04-17 NOTE — Telephone Encounter (Signed)
 HCTZ last filled:  02/05/24, #180 Tizanidine  last filled:  02/05/24, #180 Last OV:  04/06/23, annual exam Next OV:  none

## 2024-04-18 DIAGNOSIS — R29898 Other symptoms and signs involving the musculoskeletal system: Secondary | ICD-10-CM | POA: Diagnosis not present

## 2024-04-18 DIAGNOSIS — M25642 Stiffness of left hand, not elsewhere classified: Secondary | ICD-10-CM | POA: Diagnosis not present

## 2024-04-18 DIAGNOSIS — M25532 Pain in left wrist: Secondary | ICD-10-CM | POA: Diagnosis not present

## 2024-04-18 NOTE — Telephone Encounter (Signed)
 Sent with note to schedule visit.

## 2024-04-25 DIAGNOSIS — M25532 Pain in left wrist: Secondary | ICD-10-CM | POA: Diagnosis not present

## 2024-04-25 DIAGNOSIS — M25642 Stiffness of left hand, not elsewhere classified: Secondary | ICD-10-CM | POA: Diagnosis not present

## 2024-04-25 DIAGNOSIS — R531 Weakness: Secondary | ICD-10-CM | POA: Diagnosis not present

## 2024-05-02 DIAGNOSIS — R200 Anesthesia of skin: Secondary | ICD-10-CM | POA: Diagnosis not present

## 2024-05-02 DIAGNOSIS — M25532 Pain in left wrist: Secondary | ICD-10-CM | POA: Diagnosis not present

## 2024-05-02 DIAGNOSIS — R202 Paresthesia of skin: Secondary | ICD-10-CM | POA: Diagnosis not present

## 2024-05-02 DIAGNOSIS — Z789 Other specified health status: Secondary | ICD-10-CM | POA: Diagnosis not present

## 2024-05-02 DIAGNOSIS — M25642 Stiffness of left hand, not elsewhere classified: Secondary | ICD-10-CM | POA: Diagnosis not present

## 2024-05-02 DIAGNOSIS — R29898 Other symptoms and signs involving the musculoskeletal system: Secondary | ICD-10-CM | POA: Diagnosis not present

## 2024-05-09 DIAGNOSIS — G90512 Complex regional pain syndrome I of left upper limb: Secondary | ICD-10-CM | POA: Diagnosis not present

## 2024-05-09 DIAGNOSIS — Z4789 Encounter for other orthopedic aftercare: Secondary | ICD-10-CM | POA: Diagnosis not present

## 2024-05-09 DIAGNOSIS — G5632 Lesion of radial nerve, left upper limb: Secondary | ICD-10-CM | POA: Diagnosis not present

## 2024-05-09 DIAGNOSIS — Z96692 Finger-joint replacement of left hand: Secondary | ICD-10-CM | POA: Diagnosis not present

## 2024-05-13 DIAGNOSIS — M25532 Pain in left wrist: Secondary | ICD-10-CM | POA: Diagnosis not present

## 2024-05-13 DIAGNOSIS — M25642 Stiffness of left hand, not elsewhere classified: Secondary | ICD-10-CM | POA: Diagnosis not present

## 2024-05-13 DIAGNOSIS — R29898 Other symptoms and signs involving the musculoskeletal system: Secondary | ICD-10-CM | POA: Diagnosis not present

## 2024-05-30 ENCOUNTER — Other Ambulatory Visit: Payer: Self-pay | Admitting: Family Medicine

## 2024-05-30 DIAGNOSIS — G8929 Other chronic pain: Secondary | ICD-10-CM

## 2024-05-30 DIAGNOSIS — G9050 Complex regional pain syndrome I, unspecified: Secondary | ICD-10-CM

## 2024-06-03 NOTE — Telephone Encounter (Signed)
 Pt last visit was 04/06/23 no appointment scheduled.

## 2024-06-04 NOTE — Telephone Encounter (Signed)
Sent with note to schedule.  Thanks.

## 2024-06-17 ENCOUNTER — Other Ambulatory Visit: Payer: Self-pay | Admitting: Medical Genetics

## 2024-06-24 ENCOUNTER — Telehealth: Payer: Self-pay

## 2024-06-24 ENCOUNTER — Encounter: Payer: Self-pay | Admitting: Family Medicine

## 2024-06-24 ENCOUNTER — Ambulatory Visit: Admitting: Family Medicine

## 2024-06-24 VITALS — BP 102/72 | HR 66 | Temp 97.8°F | Ht 63.5 in | Wt 183.5 lb

## 2024-06-24 DIAGNOSIS — R1310 Dysphagia, unspecified: Secondary | ICD-10-CM

## 2024-06-24 DIAGNOSIS — R739 Hyperglycemia, unspecified: Secondary | ICD-10-CM

## 2024-06-24 DIAGNOSIS — E559 Vitamin D deficiency, unspecified: Secondary | ICD-10-CM

## 2024-06-24 DIAGNOSIS — G8929 Other chronic pain: Secondary | ICD-10-CM

## 2024-06-24 DIAGNOSIS — E785 Hyperlipidemia, unspecified: Secondary | ICD-10-CM

## 2024-06-24 DIAGNOSIS — Z0000 Encounter for general adult medical examination without abnormal findings: Secondary | ICD-10-CM

## 2024-06-24 DIAGNOSIS — L989 Disorder of the skin and subcutaneous tissue, unspecified: Secondary | ICD-10-CM

## 2024-06-24 DIAGNOSIS — E876 Hypokalemia: Secondary | ICD-10-CM

## 2024-06-24 DIAGNOSIS — E538 Deficiency of other specified B group vitamins: Secondary | ICD-10-CM | POA: Diagnosis not present

## 2024-06-24 DIAGNOSIS — R609 Edema, unspecified: Secondary | ICD-10-CM

## 2024-06-24 DIAGNOSIS — Z6832 Body mass index (BMI) 32.0-32.9, adult: Secondary | ICD-10-CM

## 2024-06-24 DIAGNOSIS — G9050 Complex regional pain syndrome I, unspecified: Secondary | ICD-10-CM | POA: Diagnosis not present

## 2024-06-24 DIAGNOSIS — Z7189 Other specified counseling: Secondary | ICD-10-CM

## 2024-06-24 LAB — CBC WITH DIFFERENTIAL/PLATELET
Basophils Absolute: 0.1 K/uL (ref 0.0–0.1)
Basophils Relative: 0.6 % (ref 0.0–3.0)
Eosinophils Absolute: 0.1 K/uL (ref 0.0–0.7)
Eosinophils Relative: 0.9 % (ref 0.0–5.0)
HCT: 40.3 % (ref 36.0–46.0)
Hemoglobin: 13.4 g/dL (ref 12.0–15.0)
Lymphocytes Relative: 17.9 % (ref 12.0–46.0)
Lymphs Abs: 1.6 K/uL (ref 0.7–4.0)
MCHC: 33.4 g/dL (ref 30.0–36.0)
MCV: 86 fl (ref 78.0–100.0)
Monocytes Absolute: 0.6 K/uL (ref 0.1–1.0)
Monocytes Relative: 6.3 % (ref 3.0–12.0)
Neutro Abs: 6.7 K/uL (ref 1.4–7.7)
Neutrophils Relative %: 74.3 % (ref 43.0–77.0)
Platelets: 296 K/uL (ref 150.0–400.0)
RBC: 4.68 Mil/uL (ref 3.87–5.11)
RDW: 14.5 % (ref 11.5–15.5)
WBC: 9 K/uL (ref 4.0–10.5)

## 2024-06-24 LAB — LIPID PANEL
Cholesterol: 192 mg/dL (ref 28–200)
HDL: 53 mg/dL
LDL Cholesterol: 122 mg/dL — ABNORMAL HIGH (ref 10–99)
NonHDL: 139.37
Total CHOL/HDL Ratio: 4
Triglycerides: 85 mg/dL (ref 10.0–149.0)
VLDL: 17 mg/dL (ref 0.0–40.0)

## 2024-06-24 LAB — COMPREHENSIVE METABOLIC PANEL WITH GFR
ALT: 14 U/L (ref 3–35)
AST: 10 U/L (ref 5–37)
Albumin: 4.3 g/dL (ref 3.5–5.2)
Alkaline Phosphatase: 65 U/L (ref 39–117)
BUN: 17 mg/dL (ref 6–23)
CO2: 30 meq/L (ref 19–32)
Calcium: 9.8 mg/dL (ref 8.4–10.5)
Chloride: 97 meq/L (ref 96–112)
Creatinine, Ser: 0.78 mg/dL (ref 0.40–1.20)
GFR: 80.04 mL/min
Glucose, Bld: 94 mg/dL (ref 70–99)
Potassium: 2.8 meq/L — CL (ref 3.5–5.1)
Sodium: 136 meq/L (ref 135–145)
Total Bilirubin: 0.5 mg/dL (ref 0.2–1.2)
Total Protein: 7.6 g/dL (ref 6.0–8.3)

## 2024-06-24 LAB — HEMOGLOBIN A1C: Hgb A1c MFr Bld: 6.4 % (ref 4.6–6.5)

## 2024-06-24 LAB — VITAMIN B12: Vitamin B-12: 921 pg/mL — ABNORMAL HIGH (ref 211–911)

## 2024-06-24 LAB — VITAMIN D 25 HYDROXY (VIT D DEFICIENCY, FRACTURES): VITD: 23.26 ng/mL — ABNORMAL LOW (ref 30.00–100.00)

## 2024-06-24 MED ORDER — ALBUTEROL SULFATE HFA 108 (90 BASE) MCG/ACT IN AERS: 2.0000 | INHALATION_SPRAY | Freq: Four times a day (QID) | RESPIRATORY_TRACT | 2 refills | Status: AC | PRN

## 2024-06-24 MED ORDER — HYDROCHLOROTHIAZIDE 25 MG PO TABS: 50.0000 mg | ORAL_TABLET | Freq: Every day | ORAL | 3 refills | Status: AC

## 2024-06-24 MED ORDER — HYDROCODONE-ACETAMINOPHEN 5-325 MG PO TABS: ORAL_TABLET | ORAL | 0 refills | Status: AC

## 2024-06-24 MED ORDER — CYANOCOBALAMIN 1000 MCG/ML IJ SOLN: INTRAMUSCULAR | 2 refills | Status: AC

## 2024-06-24 MED ORDER — TRAMADOL HCL 50 MG PO TABS: ORAL_TABLET | ORAL | 2 refills | Status: AC

## 2024-06-24 MED ORDER — TIZANIDINE HCL 2 MG PO TABS: 2.0000 mg | ORAL_TABLET | Freq: Two times a day (BID) | ORAL | 1 refills | Status: AC

## 2024-06-24 MED ORDER — POTASSIUM CHLORIDE CRYS ER 20 MEQ PO TBCR: 20.0000 meq | EXTENDED_RELEASE_TABLET | Freq: Every day | ORAL | 3 refills | Status: AC

## 2024-06-24 MED ORDER — "BD LUER-LOK SYRINGE 25G X 1"" 3 ML MISC": 1 refills | Status: AC

## 2024-06-24 MED ORDER — ONDANSETRON HCL 4 MG PO TABS: 4.0000 mg | ORAL_TABLET | Freq: Three times a day (TID) | ORAL | 2 refills | Status: AC | PRN

## 2024-06-24 NOTE — Telephone Encounter (Signed)
" °  CRITICAL VALUE potassium 2.8  RECEIVER (on-site recipient of call):Isabella Roemmich LPN  DATE & TIME NOTIFIED: 12:38 PM 06/24/2024   MESSENGER (representative from lab):[facility]  MD NOTIFIED: Dr. Arlyss Solian  TIME OF NOTIFICATION:12:38 PM  RESPONSE:   "

## 2024-06-24 NOTE — Telephone Encounter (Signed)
 Please call pt.  Low potassium.  Would start potassium 20 mEq per day.  Rx sent. Would recheck labs in about 1 week.  I put in the order.  Needs lab visit.  Thanks.

## 2024-06-24 NOTE — Progress Notes (Signed)
 Flu encouraged, declined. Shingles encouraged. PNA d/w pt.   Tetanus encouraged.  COVID-vaccine previously done. Routine vaccination encouraged. Refer to GI for colonoscopy and likely EGD.   Breast cancer screening 2024 Bone density test 2021 Advance directive-husband designated if patient were incapacitated. Pap done 2024  Irritated lesion on the L pinna for > 1 month, not healing.  See exam, d/w pt about options.   She had used hydrochlorothiazide  for BLE edema, no ADE on med.    No recent SABA use, but rx sent to have on hand.    B12 pending and d/w pt about continuing monthly injection.    She has been off vit D replacement.  Labs pending.    Joint pain/chronic pain.  Still on hydrocodone  at baseline.  Still taking tramadol .  She has mult med allergies.  Never pain free.  Prev pain clinic eval d/w pt.  Not drowsy.  She had L 3rd finger surgery.  She has had shoulder injections with some relief and she is trying to avoid shoulder surgery.     H/o esophageal dilation years ago.  D/w pt about seeing GI for occ mild dysphagia.  Referred 2026.  Cautions d/w pt.    Wellbutrin  didn't help with energy or mood.  She is off med now.  Mood is fine at this point.  I asked her to update me as needed.   She was asking about weight loss meds.  See AVS.  D/w pt about checking on coverage with ozempic.  Routine cautions d/w pt.  Topamax prev helped.    Meds, vitals, and allergies reviewed.   ROS: Per HPI unless specifically indicated in ROS section   GEN: nad, alert and oriented HEENT: ncat NECK: supple w/o LA CV: rrr. PULM: ctab, no inc wob ABD: soft, +bs EXT: no BLE edema.   SKIN: well perfused.  Chronic joint and arm changes in the BUE.   She has chronic joint changes on the R foot at baseline.    Irritated lesion on the L pinna, concern for SCC vs BCC.

## 2024-06-24 NOTE — Patient Instructions (Addendum)
 Go to the lab on the way out.   If you have mychart we'll likely use that to update you.    Take care.  Glad to see you. Check on coverage re: wegovy and let me know about that.   Refer to GI.  Ask the pharmacy about vaccines.   Refer to dermatology.

## 2024-06-25 ENCOUNTER — Other Ambulatory Visit

## 2024-06-29 DIAGNOSIS — Z6832 Body mass index (BMI) 32.0-32.9, adult: Secondary | ICD-10-CM | POA: Insufficient documentation

## 2024-06-29 DIAGNOSIS — Z0000 Encounter for general adult medical examination without abnormal findings: Secondary | ICD-10-CM | POA: Insufficient documentation

## 2024-06-29 DIAGNOSIS — L989 Disorder of the skin and subcutaneous tissue, unspecified: Secondary | ICD-10-CM | POA: Insufficient documentation

## 2024-06-29 NOTE — Assessment & Plan Note (Signed)
 She has been off vit D replacement.  Labs pending.

## 2024-06-29 NOTE — Assessment & Plan Note (Signed)
 H/o esophageal dilation years ago.  D/w pt about seeing GI for occ mild dysphagia.  Referred 2026.  Cautions d/w pt.

## 2024-06-29 NOTE — Assessment & Plan Note (Signed)
 Flu encouraged, declined. Shingles encouraged. PNA d/w pt.   Tetanus encouraged.  COVID-vaccine previously done. Routine vaccination encouraged. Refer to GI for colonoscopy and likely EGD.   Breast cancer screening 2024 Bone density test 2021 Advance directive-husband designated if patient were incapacitated. Pap done 2024

## 2024-06-29 NOTE — Assessment & Plan Note (Signed)
 Still on hydrocodone  at baseline.  Still taking tramadol .  She has mult med allergies.  Never pain free.  Prev pain clinic eval d/w pt.  Not drowsy.  She had L 3rd finger surgery. Would continue hydrocodone  and tramadol  as is.

## 2024-06-29 NOTE — Assessment & Plan Note (Signed)
 She had used hydrochlorothiazide  for BLE edema, no ADE on med.  Continue prn use.

## 2024-06-29 NOTE — Assessment & Plan Note (Signed)
 B12 pending and d/w pt about continuing monthly injection.

## 2024-06-29 NOTE — Assessment & Plan Note (Signed)
 See AVS.  D/w pt about checking on coverage with ozempic.

## 2024-06-29 NOTE — Assessment & Plan Note (Signed)
 Irritated lesion on the L pinna, concern for SCC vs BCC.   Refer to derm.  D/w pt.  See AVS.

## 2024-06-29 NOTE — Assessment & Plan Note (Signed)
Advance directive- husband designated if patient were incapacitated.  

## 2024-07-03 ENCOUNTER — Ambulatory Visit: Payer: Self-pay | Admitting: Family Medicine

## 2024-07-03 MED ORDER — VITAMIN D3 50 MCG (2000 UT) PO CAPS: 2000.0000 [IU] | ORAL_CAPSULE | Freq: Every day | ORAL | Status: AC

## 2024-07-03 NOTE — Telephone Encounter (Signed)
 Relayed pt the message concerning her potassium level and Rx being sent in. Scheduled pt for labs next week.

## 2024-07-15 ENCOUNTER — Other Ambulatory Visit

## 2024-07-18 ENCOUNTER — Other Ambulatory Visit

## 2024-07-24 ENCOUNTER — Other Ambulatory Visit

## 2024-07-28 ENCOUNTER — Other Ambulatory Visit
